# Patient Record
Sex: Female | Born: 1960 | Race: Black or African American | Hispanic: No | Smoking: Former smoker
Health system: Southern US, Community
[De-identification: ages and names within clinical notes are randomized; demographics above are authoritative.]

## PROBLEM LIST (undated history)

## (undated) DIAGNOSIS — L439 Lichen planus, unspecified: Secondary | ICD-10-CM

## (undated) DIAGNOSIS — T783XXA Angioneurotic edema, initial encounter: Secondary | ICD-10-CM

## (undated) DIAGNOSIS — L509 Urticaria, unspecified: Secondary | ICD-10-CM

## (undated) DIAGNOSIS — J45909 Unspecified asthma, uncomplicated: Secondary | ICD-10-CM

## (undated) DIAGNOSIS — T883XXA Malignant hyperthermia due to anesthesia, initial encounter: Secondary | ICD-10-CM

## (undated) HISTORY — DX: Lichen planus, unspecified: L43.9

## (undated) HISTORY — DX: Urticaria, unspecified: L50.9

## (undated) HISTORY — DX: Unspecified asthma, uncomplicated: J45.909

## (undated) HISTORY — DX: Angioneurotic edema, initial encounter: T78.3XXA

## (undated) HISTORY — PX: ADENOIDECTOMY: SUR15

---

## 1985-02-10 HISTORY — PX: TONSILLECTOMY: SUR1361

## 1986-02-10 HISTORY — PX: TUBAL LIGATION: SHX77

## 1997-11-17 ENCOUNTER — Encounter: Admission: RE | Admit: 1997-11-17 | Discharge: 1997-11-17 | Payer: Self-pay | Admitting: Family Medicine

## 1998-02-10 HISTORY — PX: UTERINE ARTERY EMBOLIZATION: SHX2629

## 1998-05-21 ENCOUNTER — Encounter: Admission: RE | Admit: 1998-05-21 | Discharge: 1998-05-21 | Payer: Self-pay | Admitting: Family Medicine

## 1998-05-29 ENCOUNTER — Encounter: Admission: RE | Admit: 1998-05-29 | Discharge: 1998-05-29 | Payer: Self-pay | Admitting: Family Medicine

## 1998-06-12 ENCOUNTER — Other Ambulatory Visit: Admission: RE | Admit: 1998-06-12 | Discharge: 1998-06-27 | Payer: Self-pay | Admitting: *Deleted

## 1998-07-12 ENCOUNTER — Encounter: Admission: RE | Admit: 1998-07-12 | Discharge: 1998-07-12 | Payer: Self-pay | Admitting: Family Medicine

## 1998-08-30 ENCOUNTER — Encounter: Admission: RE | Admit: 1998-08-30 | Discharge: 1998-08-30 | Payer: Self-pay | Admitting: Family Medicine

## 1998-09-18 ENCOUNTER — Encounter: Admission: RE | Admit: 1998-09-18 | Discharge: 1998-09-18 | Payer: Self-pay | Admitting: Family Medicine

## 1998-10-03 ENCOUNTER — Encounter: Admission: RE | Admit: 1998-10-03 | Discharge: 1998-10-03 | Payer: Self-pay | Admitting: Family Medicine

## 1998-10-08 ENCOUNTER — Encounter: Payer: Self-pay | Admitting: Emergency Medicine

## 1998-10-08 ENCOUNTER — Emergency Department (HOSPITAL_COMMUNITY): Admission: EM | Admit: 1998-10-08 | Discharge: 1998-10-08 | Payer: Self-pay | Admitting: Emergency Medicine

## 1999-04-26 ENCOUNTER — Encounter: Admission: RE | Admit: 1999-04-26 | Discharge: 1999-04-26 | Payer: Self-pay | Admitting: Family Medicine

## 1999-09-12 ENCOUNTER — Encounter: Admission: RE | Admit: 1999-09-12 | Discharge: 1999-09-12 | Payer: Self-pay | Admitting: *Deleted

## 1999-09-12 ENCOUNTER — Encounter: Payer: Self-pay | Admitting: Family Medicine

## 1999-09-13 ENCOUNTER — Encounter: Admission: RE | Admit: 1999-09-13 | Discharge: 1999-09-13 | Payer: Self-pay | Admitting: Family Medicine

## 1999-09-20 ENCOUNTER — Encounter: Admission: RE | Admit: 1999-09-20 | Discharge: 1999-09-20 | Payer: Self-pay | Admitting: Family Medicine

## 2000-05-29 ENCOUNTER — Encounter: Admission: RE | Admit: 2000-05-29 | Discharge: 2000-05-29 | Payer: Self-pay | Admitting: Family Medicine

## 2000-07-24 ENCOUNTER — Encounter: Admission: RE | Admit: 2000-07-24 | Discharge: 2000-07-24 | Payer: Self-pay | Admitting: Family Medicine

## 2000-07-27 ENCOUNTER — Encounter: Admission: RE | Admit: 2000-07-27 | Discharge: 2000-07-27 | Payer: Self-pay | Admitting: Family Medicine

## 2000-08-24 ENCOUNTER — Encounter: Admission: RE | Admit: 2000-08-24 | Discharge: 2000-08-24 | Payer: Self-pay | Admitting: Family Medicine

## 2000-10-13 ENCOUNTER — Encounter: Admission: RE | Admit: 2000-10-13 | Discharge: 2000-10-13 | Payer: Self-pay | Admitting: Family Medicine

## 2000-10-30 ENCOUNTER — Encounter: Admission: RE | Admit: 2000-10-30 | Discharge: 2000-10-30 | Payer: Self-pay | Admitting: Family Medicine

## 2000-11-18 ENCOUNTER — Encounter: Admission: RE | Admit: 2000-11-18 | Discharge: 2000-11-18 | Payer: Self-pay | Admitting: Family Medicine

## 2000-12-15 ENCOUNTER — Encounter: Admission: RE | Admit: 2000-12-15 | Discharge: 2000-12-15 | Payer: Self-pay | Admitting: Family Medicine

## 2001-03-10 ENCOUNTER — Encounter (INDEPENDENT_AMBULATORY_CARE_PROVIDER_SITE_OTHER): Payer: Self-pay | Admitting: *Deleted

## 2001-03-10 ENCOUNTER — Ambulatory Visit (HOSPITAL_COMMUNITY): Admission: RE | Admit: 2001-03-10 | Discharge: 2001-03-10 | Payer: Self-pay | Admitting: Gastroenterology

## 2001-07-05 ENCOUNTER — Encounter: Admission: RE | Admit: 2001-07-05 | Discharge: 2001-07-05 | Payer: Self-pay | Admitting: Family Medicine

## 2001-07-05 ENCOUNTER — Other Ambulatory Visit: Admission: RE | Admit: 2001-07-05 | Discharge: 2001-07-05 | Payer: Self-pay | Admitting: Family Medicine

## 2001-07-13 ENCOUNTER — Encounter: Payer: Self-pay | Admitting: Family Medicine

## 2001-07-13 ENCOUNTER — Encounter: Admission: RE | Admit: 2001-07-13 | Discharge: 2001-07-13 | Payer: Self-pay | Admitting: Family Medicine

## 2001-11-16 ENCOUNTER — Encounter: Admission: RE | Admit: 2001-11-16 | Discharge: 2001-11-16 | Payer: Self-pay | Admitting: Family Medicine

## 2001-11-18 ENCOUNTER — Encounter: Admission: RE | Admit: 2001-11-18 | Discharge: 2001-11-18 | Payer: Self-pay | Admitting: Family Medicine

## 2002-01-10 ENCOUNTER — Encounter: Admission: RE | Admit: 2002-01-10 | Discharge: 2002-01-10 | Payer: Self-pay | Admitting: Family Medicine

## 2002-02-23 ENCOUNTER — Ambulatory Visit (HOSPITAL_COMMUNITY): Admission: RE | Admit: 2002-02-23 | Discharge: 2002-02-23 | Payer: Self-pay | Admitting: Gastroenterology

## 2002-03-04 ENCOUNTER — Encounter (INDEPENDENT_AMBULATORY_CARE_PROVIDER_SITE_OTHER): Payer: Self-pay | Admitting: Specialist

## 2002-03-04 ENCOUNTER — Ambulatory Visit (HOSPITAL_COMMUNITY): Admission: RE | Admit: 2002-03-04 | Discharge: 2002-03-04 | Payer: Self-pay | Admitting: Surgery

## 2002-03-31 ENCOUNTER — Encounter: Admission: RE | Admit: 2002-03-31 | Discharge: 2002-03-31 | Payer: Self-pay | Admitting: Family Medicine

## 2002-03-31 ENCOUNTER — Encounter: Payer: Self-pay | Admitting: Sports Medicine

## 2002-03-31 ENCOUNTER — Ambulatory Visit (HOSPITAL_COMMUNITY): Admission: RE | Admit: 2002-03-31 | Discharge: 2002-03-31 | Payer: Self-pay | Admitting: Family Medicine

## 2002-04-04 ENCOUNTER — Encounter: Admission: RE | Admit: 2002-04-04 | Discharge: 2002-04-04 | Payer: Self-pay | Admitting: Family Medicine

## 2002-05-27 ENCOUNTER — Encounter: Admission: RE | Admit: 2002-05-27 | Discharge: 2002-05-27 | Payer: Self-pay | Admitting: Family Medicine

## 2002-06-07 ENCOUNTER — Encounter: Admission: RE | Admit: 2002-06-07 | Discharge: 2002-06-07 | Payer: Self-pay | Admitting: Family Medicine

## 2002-11-24 ENCOUNTER — Encounter: Admission: RE | Admit: 2002-11-24 | Discharge: 2002-11-24 | Payer: Self-pay | Admitting: Family Medicine

## 2003-01-10 ENCOUNTER — Encounter: Admission: RE | Admit: 2003-01-10 | Discharge: 2003-01-10 | Payer: Self-pay | Admitting: Family Medicine

## 2003-01-27 ENCOUNTER — Encounter: Admission: RE | Admit: 2003-01-27 | Discharge: 2003-01-27 | Payer: Self-pay | Admitting: Family Medicine

## 2003-03-15 ENCOUNTER — Encounter: Admission: RE | Admit: 2003-03-15 | Discharge: 2003-03-15 | Payer: Self-pay | Admitting: Sports Medicine

## 2003-03-27 ENCOUNTER — Encounter: Admission: RE | Admit: 2003-03-27 | Discharge: 2003-03-27 | Payer: Self-pay | Admitting: Sports Medicine

## 2003-05-12 ENCOUNTER — Encounter: Admission: RE | Admit: 2003-05-12 | Discharge: 2003-05-12 | Payer: Self-pay | Admitting: Family Medicine

## 2003-05-12 ENCOUNTER — Encounter: Payer: Self-pay | Admitting: Family Medicine

## 2003-05-12 ENCOUNTER — Encounter (INDEPENDENT_AMBULATORY_CARE_PROVIDER_SITE_OTHER): Payer: Self-pay | Admitting: *Deleted

## 2003-05-12 LAB — CONVERTED CEMR LAB

## 2003-08-26 ENCOUNTER — Inpatient Hospital Stay (HOSPITAL_COMMUNITY): Admission: AD | Admit: 2003-08-26 | Discharge: 2003-08-26 | Payer: Self-pay | Admitting: Obstetrics & Gynecology

## 2003-10-06 ENCOUNTER — Emergency Department (HOSPITAL_COMMUNITY): Admission: EM | Admit: 2003-10-06 | Discharge: 2003-10-07 | Payer: Self-pay | Admitting: Emergency Medicine

## 2003-11-07 ENCOUNTER — Emergency Department (HOSPITAL_COMMUNITY): Admission: EM | Admit: 2003-11-07 | Discharge: 2003-11-07 | Payer: Self-pay | Admitting: Emergency Medicine

## 2003-12-07 ENCOUNTER — Ambulatory Visit: Payer: Self-pay | Admitting: Family Medicine

## 2003-12-22 ENCOUNTER — Ambulatory Visit: Payer: Self-pay | Admitting: Family Medicine

## 2004-01-03 ENCOUNTER — Ambulatory Visit: Payer: Self-pay | Admitting: Family Medicine

## 2004-01-08 ENCOUNTER — Encounter (HOSPITAL_COMMUNITY): Admission: RE | Admit: 2004-01-08 | Discharge: 2004-01-11 | Payer: Self-pay | Admitting: Family Medicine

## 2004-01-10 ENCOUNTER — Ambulatory Visit: Payer: Self-pay | Admitting: Family Medicine

## 2004-01-11 ENCOUNTER — Ambulatory Visit: Payer: Self-pay | Admitting: Family Medicine

## 2004-01-15 ENCOUNTER — Ambulatory Visit (HOSPITAL_COMMUNITY): Admission: RE | Admit: 2004-01-15 | Discharge: 2004-01-15 | Payer: Self-pay | Admitting: Family Medicine

## 2004-01-22 ENCOUNTER — Ambulatory Visit: Payer: Self-pay | Admitting: Sports Medicine

## 2004-01-25 ENCOUNTER — Ambulatory Visit: Payer: Self-pay | Admitting: Family Medicine

## 2004-01-25 ENCOUNTER — Encounter (INDEPENDENT_AMBULATORY_CARE_PROVIDER_SITE_OTHER): Payer: Self-pay | Admitting: *Deleted

## 2004-01-25 ENCOUNTER — Other Ambulatory Visit: Admission: RE | Admit: 2004-01-25 | Discharge: 2004-01-25 | Payer: Self-pay | Admitting: Family Medicine

## 2004-02-20 ENCOUNTER — Encounter: Admission: RE | Admit: 2004-02-20 | Discharge: 2004-02-20 | Payer: Self-pay | Admitting: Family Medicine

## 2004-02-25 ENCOUNTER — Encounter: Admission: RE | Admit: 2004-02-25 | Discharge: 2004-02-25 | Payer: Self-pay | Admitting: Interventional Radiology

## 2004-03-11 ENCOUNTER — Ambulatory Visit (HOSPITAL_COMMUNITY): Admission: RE | Admit: 2004-03-11 | Discharge: 2004-03-11 | Payer: Self-pay | Admitting: Interventional Radiology

## 2004-03-14 ENCOUNTER — Observation Stay (HOSPITAL_COMMUNITY): Admission: RE | Admit: 2004-03-14 | Discharge: 2004-03-16 | Payer: Self-pay | Admitting: Interventional Radiology

## 2004-03-18 ENCOUNTER — Encounter: Payer: Self-pay | Admitting: Emergency Medicine

## 2004-03-19 ENCOUNTER — Observation Stay (HOSPITAL_COMMUNITY): Admission: AD | Admit: 2004-03-19 | Discharge: 2004-03-20 | Payer: Self-pay | Admitting: Family Medicine

## 2004-03-28 ENCOUNTER — Ambulatory Visit: Payer: Self-pay | Admitting: Family Medicine

## 2004-04-01 ENCOUNTER — Ambulatory Visit (HOSPITAL_COMMUNITY): Admission: RE | Admit: 2004-04-01 | Discharge: 2004-04-01 | Payer: Self-pay | Admitting: Family Medicine

## 2004-04-01 ENCOUNTER — Ambulatory Visit: Payer: Self-pay | Admitting: Family Medicine

## 2004-04-01 ENCOUNTER — Encounter (INDEPENDENT_AMBULATORY_CARE_PROVIDER_SITE_OTHER): Payer: Self-pay | Admitting: Specialist

## 2004-05-07 ENCOUNTER — Encounter: Admission: RE | Admit: 2004-05-07 | Discharge: 2004-05-07 | Payer: Self-pay | Admitting: Family Medicine

## 2004-05-16 ENCOUNTER — Ambulatory Visit: Payer: Self-pay | Admitting: Family Medicine

## 2004-06-13 ENCOUNTER — Ambulatory Visit: Payer: Self-pay | Admitting: Family Medicine

## 2004-06-17 ENCOUNTER — Ambulatory Visit: Payer: Self-pay | Admitting: Family Medicine

## 2004-07-03 ENCOUNTER — Ambulatory Visit: Payer: Self-pay | Admitting: Family Medicine

## 2004-07-04 ENCOUNTER — Ambulatory Visit: Payer: Self-pay | Admitting: Obstetrics and Gynecology

## 2004-08-19 ENCOUNTER — Emergency Department (HOSPITAL_COMMUNITY): Admission: EM | Admit: 2004-08-19 | Discharge: 2004-08-19 | Payer: Self-pay | Admitting: Family Medicine

## 2004-08-28 ENCOUNTER — Ambulatory Visit: Payer: Self-pay | Admitting: Family Medicine

## 2004-09-26 ENCOUNTER — Ambulatory Visit: Payer: Self-pay | Admitting: Family Medicine

## 2004-10-03 ENCOUNTER — Encounter: Admission: RE | Admit: 2004-10-03 | Discharge: 2004-10-03 | Payer: Self-pay | Admitting: Interventional Radiology

## 2005-01-09 ENCOUNTER — Ambulatory Visit: Payer: Self-pay | Admitting: Sports Medicine

## 2005-01-27 ENCOUNTER — Ambulatory Visit: Payer: Self-pay | Admitting: Family Medicine

## 2005-02-17 ENCOUNTER — Ambulatory Visit: Payer: Self-pay | Admitting: Sports Medicine

## 2005-04-02 ENCOUNTER — Ambulatory Visit: Payer: Self-pay | Admitting: Sports Medicine

## 2005-04-04 ENCOUNTER — Ambulatory Visit: Payer: Self-pay | Admitting: Family Medicine

## 2005-05-14 ENCOUNTER — Ambulatory Visit: Payer: Self-pay | Admitting: Family Medicine

## 2005-06-23 ENCOUNTER — Ambulatory Visit: Payer: Self-pay | Admitting: Sports Medicine

## 2006-04-09 DIAGNOSIS — M159 Polyosteoarthritis, unspecified: Secondary | ICD-10-CM | POA: Insufficient documentation

## 2006-04-09 DIAGNOSIS — G5600 Carpal tunnel syndrome, unspecified upper limb: Secondary | ICD-10-CM | POA: Insufficient documentation

## 2006-04-09 DIAGNOSIS — J309 Allergic rhinitis, unspecified: Secondary | ICD-10-CM | POA: Insufficient documentation

## 2006-04-09 DIAGNOSIS — J45909 Unspecified asthma, uncomplicated: Secondary | ICD-10-CM | POA: Insufficient documentation

## 2006-04-09 DIAGNOSIS — R946 Abnormal results of thyroid function studies: Secondary | ICD-10-CM | POA: Insufficient documentation

## 2006-04-10 ENCOUNTER — Encounter (INDEPENDENT_AMBULATORY_CARE_PROVIDER_SITE_OTHER): Payer: Self-pay | Admitting: *Deleted

## 2006-05-04 ENCOUNTER — Encounter: Admission: RE | Admit: 2006-05-04 | Discharge: 2006-05-04 | Payer: Self-pay | Admitting: Family Medicine

## 2006-05-04 ENCOUNTER — Encounter: Payer: Self-pay | Admitting: Family Medicine

## 2006-05-07 ENCOUNTER — Telehealth (INDEPENDENT_AMBULATORY_CARE_PROVIDER_SITE_OTHER): Payer: Self-pay | Admitting: Family Medicine

## 2006-05-08 ENCOUNTER — Telehealth: Payer: Self-pay | Admitting: Family Medicine

## 2006-05-08 ENCOUNTER — Telehealth (INDEPENDENT_AMBULATORY_CARE_PROVIDER_SITE_OTHER): Payer: Self-pay | Admitting: *Deleted

## 2006-05-12 ENCOUNTER — Encounter: Payer: Self-pay | Admitting: Family Medicine

## 2006-07-23 ENCOUNTER — Encounter (INDEPENDENT_AMBULATORY_CARE_PROVIDER_SITE_OTHER): Payer: Self-pay | Admitting: Gynecology

## 2006-07-23 ENCOUNTER — Ambulatory Visit: Payer: Self-pay | Admitting: Gynecology

## 2006-11-10 ENCOUNTER — Encounter: Payer: Self-pay | Admitting: Family Medicine

## 2006-11-11 ENCOUNTER — Encounter: Payer: Self-pay | Admitting: Family Medicine

## 2007-01-01 ENCOUNTER — Encounter: Payer: Self-pay | Admitting: Family Medicine

## 2007-01-09 ENCOUNTER — Emergency Department (HOSPITAL_COMMUNITY): Admission: EM | Admit: 2007-01-09 | Discharge: 2007-01-09 | Payer: Self-pay | Admitting: Family Medicine

## 2007-01-09 ENCOUNTER — Telehealth (INDEPENDENT_AMBULATORY_CARE_PROVIDER_SITE_OTHER): Payer: Self-pay | Admitting: Family Medicine

## 2007-01-11 ENCOUNTER — Encounter: Payer: Self-pay | Admitting: Family Medicine

## 2007-01-21 ENCOUNTER — Encounter: Payer: Self-pay | Admitting: Family Medicine

## 2007-01-21 ENCOUNTER — Telehealth: Payer: Self-pay | Admitting: *Deleted

## 2007-01-22 ENCOUNTER — Encounter: Payer: Self-pay | Admitting: Family Medicine

## 2007-01-27 ENCOUNTER — Ambulatory Visit: Payer: Self-pay | Admitting: Family Medicine

## 2007-01-29 ENCOUNTER — Encounter: Payer: Self-pay | Admitting: Family Medicine

## 2007-01-29 DIAGNOSIS — D126 Benign neoplasm of colon, unspecified: Secondary | ICD-10-CM | POA: Insufficient documentation

## 2007-01-29 DIAGNOSIS — K219 Gastro-esophageal reflux disease without esophagitis: Secondary | ICD-10-CM | POA: Insufficient documentation

## 2007-02-16 ENCOUNTER — Ambulatory Visit: Payer: Self-pay | Admitting: Family Medicine

## 2007-03-11 ENCOUNTER — Ambulatory Visit: Payer: Self-pay | Admitting: Internal Medicine

## 2007-03-11 ENCOUNTER — Telehealth (INDEPENDENT_AMBULATORY_CARE_PROVIDER_SITE_OTHER): Payer: Self-pay | Admitting: *Deleted

## 2007-03-30 ENCOUNTER — Encounter: Payer: Self-pay | Admitting: Family Medicine

## 2007-04-23 ENCOUNTER — Encounter: Payer: Self-pay | Admitting: Family Medicine

## 2007-04-29 ENCOUNTER — Encounter (INDEPENDENT_AMBULATORY_CARE_PROVIDER_SITE_OTHER): Payer: Self-pay | Admitting: Family Medicine

## 2007-04-29 ENCOUNTER — Ambulatory Visit: Payer: Self-pay | Admitting: Family Medicine

## 2007-07-20 ENCOUNTER — Telehealth: Payer: Self-pay | Admitting: *Deleted

## 2007-09-06 ENCOUNTER — Encounter: Admission: RE | Admit: 2007-09-06 | Discharge: 2007-09-06 | Payer: Self-pay | Admitting: Family Medicine

## 2007-10-11 ENCOUNTER — Telehealth: Payer: Self-pay | Admitting: *Deleted

## 2007-10-25 ENCOUNTER — Ambulatory Visit: Payer: Self-pay | Admitting: Family Medicine

## 2007-10-25 ENCOUNTER — Encounter: Payer: Self-pay | Admitting: Family Medicine

## 2007-11-09 ENCOUNTER — Encounter: Payer: Self-pay | Admitting: Family Medicine

## 2007-11-09 ENCOUNTER — Ambulatory Visit: Payer: Self-pay | Admitting: Family Medicine

## 2007-11-09 ENCOUNTER — Telehealth: Payer: Self-pay | Admitting: Family Medicine

## 2007-11-09 LAB — CONVERTED CEMR LAB
AST: 16 units/L (ref 0–37)
Alkaline Phosphatase: 72 units/L (ref 39–117)
BUN: 11 mg/dL (ref 6–23)
Creatinine, Ser: 0.74 mg/dL (ref 0.40–1.20)
Glucose, Bld: 96 mg/dL (ref 70–99)
HCT: 39.4 % (ref 36.0–46.0)
HDL: 41 mg/dL (ref >39)
Hemoglobin: 13.2 g/dL (ref 12.0–15.0)
LDL Cholesterol: 85 mg/dL (ref 0–99)
MCHC: 33.5 g/dL (ref 30.0–36.0)
MCV: 88.5 fL (ref 78.0–100.0)
Potassium: 4.1 meq/L (ref 3.5–5.3)
RDW: 12.7 % (ref 11.5–15.5)
TSH: 0.551 microintl units/mL (ref 0.350–4.50)
Total Bilirubin: 0.4 mg/dL (ref 0.3–1.2)
Total CHOL/HDL Ratio: 3.6
Triglycerides: 101 mg/dL (ref <150)
VLDL: 20 mg/dL (ref 0–40)

## 2008-05-30 ENCOUNTER — Ambulatory Visit: Payer: Self-pay | Admitting: Family Medicine

## 2008-05-30 ENCOUNTER — Telehealth: Payer: Self-pay | Admitting: Family Medicine

## 2008-05-30 DIAGNOSIS — I10 Essential (primary) hypertension: Secondary | ICD-10-CM | POA: Insufficient documentation

## 2008-05-31 ENCOUNTER — Telehealth: Payer: Self-pay | Admitting: Family Medicine

## 2008-06-28 ENCOUNTER — Ambulatory Visit: Payer: Self-pay | Admitting: Family Medicine

## 2008-10-27 ENCOUNTER — Ambulatory Visit: Payer: Self-pay | Admitting: Family Medicine

## 2008-10-27 ENCOUNTER — Encounter: Admission: RE | Admit: 2008-10-27 | Discharge: 2008-10-27 | Payer: Self-pay | Admitting: Family Medicine

## 2008-10-27 DIAGNOSIS — M545 Low back pain, unspecified: Secondary | ICD-10-CM | POA: Insufficient documentation

## 2008-12-20 ENCOUNTER — Telehealth: Payer: Self-pay | Admitting: Family Medicine

## 2008-12-20 ENCOUNTER — Encounter: Payer: Self-pay | Admitting: Family Medicine

## 2009-03-21 ENCOUNTER — Telehealth: Payer: Self-pay | Admitting: Family Medicine

## 2009-03-22 ENCOUNTER — Ambulatory Visit: Payer: Self-pay | Admitting: Family Medicine

## 2009-03-26 ENCOUNTER — Ambulatory Visit (HOSPITAL_COMMUNITY): Admission: RE | Admit: 2009-03-26 | Discharge: 2009-03-26 | Payer: Self-pay | Admitting: Family Medicine

## 2009-03-27 ENCOUNTER — Telehealth: Payer: Self-pay | Admitting: Family Medicine

## 2009-03-28 ENCOUNTER — Encounter: Payer: Self-pay | Admitting: Family Medicine

## 2009-11-01 ENCOUNTER — Encounter: Payer: Self-pay | Admitting: Family Medicine

## 2010-01-25 ENCOUNTER — Ambulatory Visit: Payer: Self-pay | Admitting: Family Medicine

## 2010-02-15 ENCOUNTER — Ambulatory Visit: Admission: RE | Admit: 2010-02-15 | Discharge: 2010-02-15 | Payer: Self-pay | Source: Home / Self Care

## 2010-02-15 ENCOUNTER — Encounter: Payer: Self-pay | Admitting: Family Medicine

## 2010-02-18 LAB — CONVERTED CEMR LAB
ALT: 12 units/L (ref 0–35)
AST: 14 units/L (ref 0–37)
BUN: 9 mg/dL (ref 6–23)
Creatinine, Ser: 0.73 mg/dL (ref 0.40–1.20)
HCT: 37 % (ref 36.0–46.0)
HDL: 38 mg/dL — ABNORMAL LOW (ref >39)
Hemoglobin: 12.6 g/dL (ref 12.0–15.0)
LDL Cholesterol: 85 mg/dL (ref 0–99)
MCV: 88.1 fL (ref 78.0–100.0)
Platelets: 267 10*3/uL (ref 150–400)
RDW: 12.9 % (ref 11.5–15.5)
Total Bilirubin: 0.4 mg/dL (ref 0.3–1.2)
Total CHOL/HDL Ratio: 3.6

## 2010-02-28 ENCOUNTER — Ambulatory Visit: Admit: 2010-02-28 | Payer: Self-pay | Admitting: Obstetrics & Gynecology

## 2010-03-03 ENCOUNTER — Encounter: Payer: Self-pay | Admitting: Family Medicine

## 2010-03-03 ENCOUNTER — Encounter: Payer: Self-pay | Admitting: Interventional Radiology

## 2010-03-12 NOTE — Letter (Signed)
Summary: Generic Letter  [facility]  [address]   [place], [place] [address]   Phone: [phone]  Fax: [phone]    03/28/2009  NESSA RAMAKER [address] [place], [place]  [address]  Dear Ms. Rutledge,   I am happy to tell you that your x-ray of your knee was normal.  Please let us know if you continue to have problems with pain.   Sincerely,   Tyden Kann [place] MD  Appended Document: Generic Letter mailed.

## 2010-03-12 NOTE — Progress Notes (Signed)
Summary: triage  Phone Note Call from Patient Call back at [phone] ext [phone]   Caller: Patient Summary of Call: Banged knee on corner of desk a couple of weeks ago and has swelling down her calf.  Painful to touch on the inside of leg.  Has tried to keep her leg elevated.  Not sure if she should be seen for this or not. Initial call taken by: Clydell Hakim,  March 21, 2009 1:52 PM  Follow-up for Phone Call        advised seeing a doctor. appt tomorrow with Dr. Sw[place]iland at 11:45 .unable to come at any other time & can use the time for her lunch break. continue otc Follow-up by: Golden Circle RN,  March 21, 2009 2:02 PM  Additional Follow-up for Phone Call Additional follow up Details #1::        noted and agree Additional Follow-up by: Doralee Albino MD,  March 21, 2009 2:17 PM

## 2010-03-12 NOTE — Progress Notes (Signed)
Summary: results  Phone Note Call from Patient Call back at Home Phone [phone] Call back at [phone]   Caller: Patient Summary of Call: pt is wanting results of xrays from yesterday Initial call taken by: De Nurse,  March 27, 2009 12:24 PM  Follow-up for Phone Call        will forward to MD. Follow-up by: Theresia Lo RN,  March 27, 2009 1:50 PM  Additional Follow-up for Phone Call Additional follow up Details #1::        called and informed Xrays are normal Additional Follow-up by: Doralee Albino MD,  March 27, 2009 2:52 PM

## 2010-03-12 NOTE — Miscellaneous (Signed)
Summary: Asthma clarifcation  Clinical Lists Changes  Problems: Changed problem from ASTHMA, UNSPECIFIED (ICD-493.90) to ASTHMA, PERSISTENT, MODERATE (ICD-493.90) 

## 2010-03-12 NOTE — Assessment & Plan Note (Signed)
Summary: knee & leg injury f/u/Bamberg/Hensel   Vital Signs:  Patient profile:   50 year old female Weight:      184.8 pounds Temp:     98 degrees F oral Pulse rate:   79 / minute Pulse rhythm:   regular BP sitting:   139 / 103  Vitals Entered By: Loralee Pacas CMA (March 22, 2009 12:11 PM) Pain Assessment Patient in pain? yes     Location: knee Intensity: 8   History of Present Illness: Pt banged knee on corner of desk 3 weeks ago, and has tingling down thigh.   Knee feels really tender on inside.  Feels swollen around knee and calf.  Then banged knee again this week - working in another area and is adjusting to this.  Pain hurts worse in morning, but does not ease during day.  Pain 8-10/10.  Took Motrin or Aleve and went to sleep.  No locking.  Worse after sitting long time.   Denies previous knee pain.  Continued to work out with cardio/zumba class until 2 weeks ago.    Did not take BP meds today. No chest pain/SOB.  Slight HA.  Allergies: 1)  ! Codeine 2)  ! Morphine 3)  ! Percocet 4)  ! Flagyl 5)  ! Sulfa 6)  ! * Anesthesia (No Gases)  Review of Systems       See HPI  Physical Exam  General:  Well-developed,well-nourished,in no acute distress; alert,appropriate and cooperative throughout examination Lungs:  Normal respiratory effort, chest expands symmetrically. Lungs are clear to auscultation, no crackles or wheezes. Heart:  Normal rate and regular rhythm. S1 and S2 normal without gallop, murmur, click, rub or other extra sounds. Msk:  B knees without erythema/warmth/swelling. B knees stable ant/post/media/lat.  No crepitus.   L knee very TTP along medial  joint line.FROM flexion, extension.  Neg Mcmurray's B.   Extremities:  Calf without swelling or warmth B.  L calf somewhat TTP.   Impression & Recommendations:  Problem # 1:  KNEE PAIN, LEFT (ICD-719.46) Given several weeks of pain and tenderness along joint line, will check knee films.  Pt taking minimal  amounts of NSAIDs.  May increase these.  If no relief, return to clinic. Her updated medication list for this problem includes:    Diclofenac Sodium Cr 100 Mg Xr24h-tab (Diclofenac sodium) ..... One by mouth daily for back and neck pain    Cyclobenzaprine Hcl 10 Mg Tabs (Cyclobenzaprine hcl) ..... One by mouth at bedtime as needed for muscle spasm    Naproxen 500 Mg Tabs (Naproxen) .Marland Kitchen... Take 1 pill twice daily for knee pain.  Orders: Diagnostic X-Ray/Fluoroscopy (Diagnostic X-Ray/Flu) [facility]- Est Level  3 ([address])  Problem # 2:  HYPERTENSION, BENIGN ESSENTIAL (ICD-401.1)  Elevated today.  Did not take meds today.  No alarming symptoms.  Follow up with Dr. Leveda Anna within 4 weeks. updated medication list for this problem includes:    Hydrochlorothiazide 12.5 Mg Tabs (Hydrochlorothiazide) .Marland Kitchen... Take 1 tab  by mouth every morning  Orders: [facility]- Est Level  3 ([address])  Complete Medication List: 1)  Veramyst 27.5 Mcg/spray Susp (Fluticasone furoate) .... 2 sprays each nostril daily 2)  Albuterol 90 Mcg/act Aers (Albuterol) .... 2 puffs q4h prn 3)  Advair Diskus 250-50 Mcg/dose Misc (Fluticasone-salmeterol) .... One by mouth bid 4)  Loratadine 10 Mg Tabs (Loratadine) .... One by mouth daily otc 5)  Hydrochlorothiazide 12.5 Mg Tabs (Hydrochlorothiazide) .... Take 1 tab  by mouth every morning 6)  Azelastine Hcl 0.05 % Soln (Azelastine hcl) .... One drop each eye two times a day disp one bottle 7)  Diclofenac Sodium Cr 100 Mg Xr24h-tab (Diclofenac sodium) .... One by mouth daily for back and neck pain 8)  Cyclobenzaprine Hcl 10 Mg Tabs (Cyclobenzaprine hcl) .... One by mouth at bedtime as needed for muscle spasm 9)  Naproxen 500 Mg Tabs (Naproxen) .... Take 1 pill twice daily for knee pain.  Other Orders: Influenza Vaccine NON MCR ([address])  Patient Instructions: 1)  Please stop by the radiology department in the hospital to get the knee x-ray. 2)  Try the naproxyn twice a day.  I have sent the  prescription to [facility]. 3)  Please come back to see Dr. Leveda Anna within 4 weeks to talk about your blood pressure.   4)  Let us know if the knee is getting worse or if you have any concerns. Prescriptions: NAPROXEN 500 MG TABS (NAPROXEN) Take 1 pill twice daily for knee pain.  #60 x 0   Entered and Authorized by:   Dezman Granda [place] MD   Signed by:   Letticia Bhattacharyya [place] MD on 03/22/2009   Method used:   Electronically to        [facility] (retail)       [address]       [place], [place]  [address]       Ph: [phone]       Fax: [phone]   RxID:   [id number]    Orders Added: 1)  Diagnostic X-Ray/Fluoroscopy [Diagnostic X-Ray/Flu] 2)  Influenza Vaccine NON MCR [00028] 3)  [facility]- Est Level  3 [[address]]    Immunizations Administered:  Influenza Vaccine # 1:    Vaccine Type: Fluvax Non-MCR    Site: right deltoid    Mfr: GlaxoSmithKline    Dose: 0.5 ml    Route: IM    Given by: Tessie Fass CMA    Exp. Date: 08/09/2009    Lot #: [id number]    VIS given: 09/03/06 version given March 22, 2009.  Flu Vaccine Consent Questions:    Do you have a history of severe allergic reactions to this vaccine? no    Any prior history of allergic reactions to egg and/or gelatin? no    Do you have a sensitivity to the preservative Thimersol? no    Do you have a past history of Guillan-Barre Syndrome? no    Do you currently have an acute febrile illness? no    Have you ever had a severe reaction to latex? no    Vaccine information given and explained to patient? yes    Are you currently pregnant? no

## 2010-03-13 ENCOUNTER — Encounter: Payer: Self-pay | Admitting: Family Medicine

## 2010-03-14 NOTE — Assessment & Plan Note (Signed)
Summary: vaginal bleeding, bump in vaginal area/tlb   Vital Signs:  Patient profile:   50 year old female Height:      66.5 inches Weight:      189 pounds BMI:     30.16 BSA:     1.97 Temp:     98.1 degrees F Pulse rate:   72 / minute BP sitting:   142 / 92  Vitals Entered By: Jone Baseman CMA (January 25, 2010 10:54 AM) CC: vaginal bump Is Patient Diabetic? No Pain Assessment Patient in pain? no        Primary Care Provider:  Doralee Albino MD  CC:  vaginal bump.  History of Present Illness: 1) Bump on left labum minorum: x past few days. Not painful, but noted some slight bleeding from it. No prior history of labial lesions.   Denies dysuria, hematuria, menstruation or vaginal bleeding, vaginitis, vaginal discharge, fever,   Habits & Providers  Alcohol-Tobacco-Diet     Tobacco Status: never     Tobacco Counseling: not to resume use of tobacco products  Current Medications (verified): 1)  Veramyst 27.5 Mcg/spray  Susp (Fluticasone Furoate) .... 2 Sprays Each Nostril Daily 2)  Albuterol 90 Mcg/act  Aers (Albuterol) .... 2 Puffs Q4h Prn 3)  Advair Diskus 250-50 Mcg/dose Misc (Fluticasone-Salmeterol) .... One By Mouth Bid 4)  Loratadine 10 Mg Tabs (Loratadine) .... One By Mouth Daily Otc 5)  Hydrochlorothiazide 12.5 Mg  Tabs (Hydrochlorothiazide) .... Take 1 Tab  By Mouth Every Morning 6)  Azelastine Hcl 0.05 % Soln (Azelastine Hcl) .... One Drop Each Eye Two Times A Day Disp One Bottle 7)  Diclofenac Sodium Cr 100 Mg Xr24h-Tab (Diclofenac Sodium) .... One By Mouth Daily For Back and Neck Pain 8)  Cyclobenzaprine Hcl 10 Mg Tabs (Cyclobenzaprine Hcl) .... One By Mouth At Bedtime As Needed For Muscle Spasm 9)  Naproxen 500 Mg Tabs (Naproxen) .... Take 1 Pill Twice Daily For Knee Pain.  Allergies (verified): 1)  ! Codeine 2)  ! Morphine 3)  ! Percocet 4)  ! Flagyl 5)  ! Sulfa 6)  ! * Anesthesia (No Gases)  Physical Exam  General:  obese, pleasant, NAD    Mouth:  no oral lesions  Genitalia:  3mm area of induration left labium minorum w/o fluctuance or erythema  no vaginal discharge mucosa pink and moist    Impression & Recommendations:  Problem # 1:  BARTHOLIN'S CYST ABSCESS (ICD-616.3) Assessment New  Possibly mild Bartholin's cyst abscess given location. Appears to be healing well. Follow up as needed. Would have her mention this to her PCP as well given reports of bleeding. Advised to look out for any bleeding in the future.   Orders: [facility]- Est Level  3 ([address])  Complete Medication List: 1)  Veramyst 27.5 Mcg/spray Susp (Fluticasone furoate) .... 2 sprays each nostril daily 2)  Albuterol 90 Mcg/act Aers (Albuterol) .... 2 puffs q4h prn 3)  Advair Diskus 250-50 Mcg/dose Misc (Fluticasone-salmeterol) .... One by mouth bid 4)  Loratadine 10 Mg Tabs (Loratadine) .... One by mouth daily otc 5)  Hydrochlorothiazide 12.5 Mg Tabs (Hydrochlorothiazide) .... Take 1 tab  by mouth every morning 6)  Azelastine Hcl 0.05 % Soln (Azelastine hcl) .... One drop each eye two times a day disp one bottle 7)  Diclofenac Sodium Cr 100 Mg Xr24h-tab (Diclofenac sodium) .... One by mouth daily for back and neck pain 8)  Cyclobenzaprine Hcl 10 Mg Tabs (Cyclobenzaprine hcl) .... One by mouth at  bedtime as needed for muscle spasm 9)  Naproxen 500 Mg Tabs (Naproxen) .... Take 1 pill twice daily for knee pain. 10)  Epipen 2-pak 0.3 Mg/0.18ml Devi (Epinephrine) .... Use as directed im for anaphylaxis Prescriptions: EPIPEN 2-PAK 0.3 MG/0.3ML DEVI (EPINEPHRINE) use as directed IM for anaphylaxis  #1 x 0   Entered and Authorized by:   Bobby Rumpf  MD   Signed by:   Bobby Rumpf  MD on 01/25/2010   Method used:   Electronically to        [facility] (retail)       [address]       [place], [place]  [address]       Ph: [phone]       Fax: [phone]   RxID:   [id number] ALBUTEROL 90 MCG/ACT  AERS (ALBUTEROL) 2 puffs  q4h prn  #1 x 1   Entered and Authorized by:   Bobby Rumpf  MD   Signed by:   Bobby Rumpf  MD on 01/25/2010   Method used:   Electronically to        [facility] (retail)       [address]       [place], [place]  [address]       Ph: [phone]       Fax: [phone]   RxID:   [id number]    Orders Added: 1)  [facility]- Est Level  3 [[address]]

## 2010-03-14 NOTE — Assessment & Plan Note (Signed)
Summary: CPE/KH   FLU SHOT GIVEN TODAY.Jimmy Footman, CMA  February 15, 2010 5:08 PM   Vital Signs:  Patient profile:   50 year old female Weight:      189 pounds BMI:     30.16 Temp:     98.5 degrees F oral Pulse rate:   83 / minute Pulse rhythm:   regular BP sitting:   145 / 100  (left arm) Cuff size:   regular  Vitals Entered By: Loralee Pacas CMA (February 15, 2010 3:30 PM)  Serial Vital Signs/Assessments:  Time      Position  BP       Pulse  Resp  Temp     By 3:42 PM             158/97                         Jimmy Footman, CMA   Primary Care Provider:  Doralee Albino MD   History of Present Illness: Here for annual physical.  States she is doing well and has no active complaints.    In addition to reviewing her screening tests and immunizations, we discussed Hx of anemia secondary to menorrhagia.  Now no menses.  Will recheck one more time. Asthma seems to be well controled. Wt is up.  not exercising as much and has been off her HCTZ.  Will restart  Habits & Providers  Alcohol-Tobacco-Diet     Alcohol drinks/day: 0     Tobacco Status: quit     Year Quit: 1995     Diet Comments: healthy  Exercise-Depression-Behavior     Does Patient Exercise: yes     Exercise Counseling: not indicated; exercise is adequate     Exercise (avg: min/session): 30-60     Times/week: 7     Have you felt down or hopeless? no     Have you felt little pleasure in things? no     STD Risk: never     Drug Use: never     Seat Belt Use: always  Current Medications (verified): 1)  Fluticasone Propionate 50 Mcg/act Susp (Fluticasone Propionate) .... One Puff Each Nostril Daily 2)  Ventolin Hfa 108 (90 Base) Mcg/act Aers (Albuterol Sulfate) .... One or Two Puffs Q4h As Needed Wheezing (Rescue) 3)  Qvar 40 Mcg/act Aers (Beclomethasone Dipropionate) .... One Puff Daily (Control) 4)  Hydrochlorothiazide 12.5 Mg  Tabs (Hydrochlorothiazide) .... Take 1 Tab  By Mouth Every Morning 5)  Diclofenac  Sodium Cr 100 Mg Xr24h-Tab (Diclofenac Sodium) .... One By Mouth Daily For Back and Neck Pain 6)  Epipen 2-Pak 0.3 Mg/0.46ml Devi (Epinephrine) .... Use As Directed Im For Anaphylaxis  Allergies (verified): 1)  ! Codeine 2)  ! Morphine 3)  ! Percocet 4)  ! Flagyl 5)  ! Sulfa 6)  ! * Anesthesia (No Gases)  Past History:  Past Medical History: Last updated: 04/29/2007 asthma is stage 1, Euthyroid Goiter, h/o Chlamydia   Past Surgical History: Last updated: 11/09/2007 ct - Abd/pelv 03/2002: degenerating fibroid - 03/31/2002,  hemrhoidectomy - 02/10/2002, T & A -, Tubal ligation -  Had uterine artery embolization 2006  Family History: Last updated: 04/29/2007 Breast Ca in [place], HTN/DM in [place], No  sig CAD/CVA/other Ca  Social History: Last updated: 04/29/2007 divorced, 2 chilren (females); [place] born 2; [place] born 55; Works as Environmental health practitioner  Risk Factors: Alcohol Use: 0 (02/15/2010) Diet: healthy (02/15/2010) Exercise: yes (  02/15/2010)  Risk Factors: Smoking Status: quit (02/15/2010)  Social History: Does Patient Exercise:  yes Smoking Status:  quit Drug Use:  never Seat Belt Use:  always STD Risk:  never  Review of Systems  The patient denies anorexia, weight loss, hoarseness, chest pain, dyspnea on exertion, peripheral edema, abdominal pain, melena, severe indigestion/heartburn, depression, and abnormal bleeding.    Physical Exam  General:  Well-developed,well-nourished,in no acute distress; alert,appropriate and cooperative throughout examination Ears:  External ear exam shows no significant lesions or deformities.  Otoscopic examination reveals clear canals, tympanic membranes are intact bilaterally without bulging, retraction, inflammation or discharge. Hearing is grossly normal bilaterally. Mouth:  Oral mucosa and oropharynx without lesions or exudates.  Teeth in good repair. Neck:  No deformities, masses, or tenderness noted. Breasts:   No mass, nodules, thickening, tenderness, bulging, retraction, inflamation, nipple discharge or skin changes noted.   Lungs:  Normal respiratory effort, chest expands symmetrically. Lungs are clear to auscultation, no crackles or wheezes. Heart:  Normal rate and regular rhythm. S1 and S2 normal without gallop, murmur, click, rub or other extra sounds. Abdomen:  Bowel sounds positive,abdomen soft and non-tender without masses, organomegaly or hernias noted. Genitalia:  Normal introitus for age, no external lesions, no vaginal discharge, mucosa pink and moist, no vaginal or cervical lesions, no vaginal atrophy, no friaility or hemorrhage, normal uterus size and position, no adnexal masses or tenderness Extremities:  No clubbing, cyanosis, edema, or deformity noted with normal full range of motion of all joints.   Neurologic:  No cranial nerve deficits noted. Station and gait are normal. Plantar reflexes are down-going bilaterally. DTRs are symmetrical throughout. Sensory, motor and coordinative functions appear intact.   Impression & Recommendations:  Problem # 1:  Preventive Health Care (ICD-V70.0)  Doing well Needs to increase activity and lose wt  Orders: [facility] - Est  40-64 yrs ([address])  Problem # 2:  HYPERTENSION, BENIGN ESSENTIAL (ICD-401.1) Assessment: Deteriorated Restart HCTZ Her updated medication list for this problem includes:    Hydrochlorothiazide 12.5 Mg Tabs (Hydrochlorothiazide) .Marland Kitchen... Take 1 tab  by mouth every morning  Orders: Comp Met-[facility] ([address]) CBC-[facility] ([address]) Lipid-[facility] ([address])  Problem # 3:  ASTHMA, PERSISTENT, MODERATE (ICD-493.90) Well controled.  For cost savings and because of good control, change Advair to Qvar. Her updated medication list for this problem includes:    Ventolin Hfa 108 (90 Base) Mcg/act Aers (Albuterol sulfate) ..... One or two puffs q4h as needed wheezing (rescue)    Qvar 40 Mcg/act Aers (Beclomethasone dipropionate) ..... One puff  daily (control)  Problem # 4:  ANEMIA, OTHER, UNSPECIFIED (ICD-285.9) Doubt this remains a problem, but will check one more time. Orders: CBC-[facility] ([address])  Complete Medication List: 1)  Fluticasone Propionate 50 Mcg/act Susp (Fluticasone propionate) .... One puff each nostril daily 2)  Ventolin Hfa 108 (90 Base) Mcg/act Aers (Albuterol sulfate) .... One or two puffs q4h as needed wheezing (rescue) 3)  Qvar 40 Mcg/act Aers (Beclomethasone dipropionate) .... One puff daily (control) 4)  Hydrochlorothiazide 12.5 Mg Tabs (Hydrochlorothiazide) .... Take 1 tab  by mouth every morning 5)  Diclofenac Sodium Cr 100 Mg Xr24h-tab (Diclofenac sodium) .... One by mouth daily for back and neck pain 6)  Epipen 2-pak 0.3 Mg/0.26ml Devi (Epinephrine) .... Use as directed im for anaphylaxis  Other Orders: Mammogram (Screening) (Mammo) Pap Smear-[facility] ([address]-28413) Flu Vaccine 17yrs + ([address]) Admin 1st Vaccine ([address]) Prescriptions: EPIPEN 2-PAK 0.3 MG/0.3ML DEVI (EPINEPHRINE) use as directed IM for anaphylaxis  #1 x  3   Entered and Authorized by:   Doralee Albino MD   Signed by:   Doralee Albino MD on 02/15/2010   Method used:   Electronically to        [facility] (retail)       [address]       [place], [place]  [address]       Ph: [phone]       Fax: [phone]   RxID:   [id number] HYDROCHLOROTHIAZIDE 12.5 MG  TABS (HYDROCHLOROTHIAZIDE) Take 1 tab  by mouth every morning  #90 x 3   Entered and Authorized by:   Doralee Albino MD   Signed by:   Doralee Albino MD on 02/15/2010   Method used:   Electronically to        [facility] (retail)       [address]       [place], [place]  [address]       Ph: [phone]       Fax: [phone]   RxID:   [id number] VENTOLIN HFA 108 (90 BASE) MCG/ACT AERS (ALBUTEROL SULFATE) one or two puffs q4h as needed wheezing (rescue)  #1 x 12   Entered and Authorized by:   Doralee Albino  MD   Signed by:   Doralee Albino MD on 02/15/2010   Method used:   Electronically to        [facility] (retail)       [address]       [place], [place]  [address]       Ph: [phone]       Fax: [phone]   RxID:   [id number] QVAR 40 MCG/ACT AERS (BECLOMETHASONE DIPROPIONATE) one puff daily (control)  #1 x 12   Entered and Authorized by:   Doralee Albino MD   Signed by:   Doralee Albino MD on 02/15/2010   Method used:   Electronically to        [facility] (retail)       [address]       [place], [place]  [address]       Ph: [phone]       Fax: [phone]   RxID:   [id number] FLUTICASONE PROPIONATE 50 MCG/ACT SUSP (FLUTICASONE PROPIONATE) one puff each nostril daily  #1 x 12   Entered and Authorized by:   Doralee Albino MD   Signed by:   Doralee Albino MD on 02/15/2010   Method used:   Electronically to        [facility] (retail)       [address]       [place], [place]  [address]       Ph: [phone]       Fax: [phone]   RxID:   [id number]    Orders Added: 1)  Mammogram (Screening) [Mammo] 2)  Pap Smear-[facility] [[address]-32671] 3)  Comp Met-[facility] [[address]-99833] 4)  Flu Vaccine 17yrs + [90658] 5)  Admin 1st Vaccine [90471] 6)  CBC-[facility] [85027] 7)  Lipid-[facility] [80061-22930] 8)  [facility] - Est  40-64 yrs [[address]396]   Immunizations Administered:  Influenza Vaccine # 1:    Vaccine Type: Fluvax 3+  Site: right deltoid    Mfr: GlaxoSmithKline    Dose: 0.5 ml    Route: IM    Given by: Jimmy Footman, CMA    Exp. Date: 08/10/2010    Lot #: [id number]    VIS given: 09/04/09 version given February 15, 2010.  Flu Vaccine Consent Questions:    Do you have a history of severe allergic reactions to this vaccine? no    Any prior history of allergic reactions to egg and/or gelatin? no    Do you have a sensitivity to the preservative Thimersol? no    Do you have a past  history of Guillan-Barre Syndrome? no    Do you currently have an acute febrile illness? no    Have you ever had a severe reaction to latex? no    Vaccine information given and explained to patient? yes    Are you currently pregnant? no   Immunizations Administered:  Influenza Vaccine # 1:    Vaccine Type: Fluvax 3+    Site: right deltoid    Mfr: GlaxoSmithKline    Dose: 0.5 ml    Route: IM    Given by: Jimmy Footman, CMA    Exp. Date: 08/10/2010    Lot #: [id number]    VIS given: 09/04/09 version given February 15, 2010.   Prevention & Chronic Care Immunizations   Influenza vaccine: Fluvax 3+  (02/15/2010)   Influenza vaccine due: 11/08/2008    Tetanus booster: 06/28/2008: Tdap   Tetanus booster due: 06/10/2008    Pneumococcal vaccine: Not documented  Colorectal Screening   Hemoccult: Done.  (05/12/2003)   Hemoccult action/deferral: Not indicated  (02/15/2010)   Hemoccult due: 05/11/2004    Colonoscopy: Results: Normal.  Date is estimate.  Sees Dr. Loreta Ave and gets careful screening because brother died of colon cancer.  She dose every three years   (08/11/2007)   Colonoscopy due: 08/2010  Other Screening   Pap smear: normal  (07/12/2007)   Pap smear action/deferral: Ordered  (02/15/2010)   Pap smear due: 07/12/2010    Mammogram: ASSESSMENT: Negative - BI-RADS 1^MM DIGITAL SCREENING  (10/27/2008)   Mammogram action/deferral: Ordered  (02/15/2010)   Mammogram due: 11/2009   Smoking status: quit  (02/15/2010)  Lipids   Total Cholesterol: 146  (11/09/2007)   LDL: 85  (11/09/2007)   LDL Direct: Not documented   HDL: 41  (11/09/2007)   Triglycerides: 101  (11/09/2007)  Hypertension   Last Blood Pressure: 145 / 100  (02/15/2010)   Serum creatinine: 0.74  (11/09/2007)   Serum potassium 4.1  (11/09/2007) CMP ordered     Hypertension flowsheet reviewed?: Yes   Progress toward BP goal: Unchanged  Self-Management Support :   Personal Goals (by the next clinic visit)  :      Personal blood pressure goal: 140/90  (10/27/2008)   Hypertension self-management support: Written self-care plan  (10/27/2008)   Nursing Instructions: Give Flu vaccine today Schedule screening mammogram (see order) Pap smear today

## 2010-04-03 ENCOUNTER — Telehealth: Payer: Self-pay | Admitting: Family Medicine

## 2010-04-03 NOTE — Telephone Encounter (Signed)
Needs to speak with RN re: pts dx, pt at dental office now trying to have work done.

## 2010-04-03 NOTE — Telephone Encounter (Signed)
Patient is currently at dentist office.  Dental assistant calling b/c patient is telling them she has a diagnosis of "malignant hypothermia" as well as multiple drug allergies (she cannot remember them all).   Reviewed her records and did not see that diagnosis in her problem list.  Did confirm that she does have multiple drug allergies, mostly pain meds.  The dentist office asks, as a safeguard,  that we fax her problem list as well a allergy list.  Faxed records to [phone].

## 2010-04-04 NOTE — Telephone Encounter (Signed)
Noted and agree with Nurse Foster's handling of this issue.  Patient does not have a history of malignant hyperthermia to my knowledge.

## 2010-04-13 ENCOUNTER — Telehealth: Payer: Self-pay | Admitting: Family Medicine

## 2010-04-13 NOTE — Telephone Encounter (Signed)
Pt had root canal done on Monday.  After root canal on upper front tooth, placed on clindamycin.  Had upper lip swelling, dentist switched antibiotic to amoxicillin.  Pt continues to have swelling in upper lip, now having swelling into cheek and nose area.  Pt denies tongue swelling, throat swelling, change in voice, no lower lip swelling.  Able to eat and drink normally. No fever.  Pt called dentist- told by office staff that the dentist is out of the country and that she should call her pcp.  I told pt to go to urgent care since swelling is worsening.

## 2010-06-25 NOTE — Group Therapy Note (Signed)
NAME:  Meghan [name], Meghan [name] NO.:  [id number]   MEDICAL RECORD NO.:  [id number]          PATIENT TYPE:  WOC   LOCATION[facility]                   FACILITY:  [facility]   PHYSICIAN:  Ginger Carne, MD DATE OF BIRTH:  08/25/1960   DATE OF SERVICE:                                  CLINIC NOTE   The patient is here today for routine yearly examination and to  discussed symptoms of hot flashes.  In February 2006 the patient had  undergone uterine artery embolization from interventional radiology.  Since then she has had symptoms of hot flashes and vaginal dryness.  An  FSH obtained in May 2006 demonstrated an FSH of 53.5 and TSH of 0.649  (normal range).  The patient since then has not been approached about  concerns regarding hormone replacement therapy.  I explained to the  patient that following a [place] It is possible to have inadvertent  embolization of the uterine vessels, resulting in a premature menopause.  Otherwise she has not had any bleeding and no other complaints.   VITAL SIGNS:  Per record.  HEENT:  Grossly normal.  BREAST EXAM:  Without masses, discharge, thickenings or tenderness.  CHEST:  Clear to percussion and auscultation.  CARDIOVASCULAR:  Exam without murmurs or enlargements, regular rate and  rhythm.  ABDOMEN:  Soft without gross hepatosplenomegaly.  PELVIC EXAM:  Pap smear performed.  Uterus is about 6 weeks in size,  globular.  Both adnexa palpable found to be normal.   IMPRESSION:  Normal gynecological examination with menopausal  symptomatology.   PLAN:  The patient was given a thorough understanding about risks,  benefits of hormone-replacement therapy.  Will be started on Prempro  0.65 mg daily.  She was advised to contact the office in about 6-8 weeks  if she continues to have symptoms.  The patient requested an HIV test           ______________________________  Ginger Carne, MD     SHB/MEDQ  D:  07/23/2006  T:  07/23/2006   Job:  [id number]

## 2010-06-28 NOTE — Group Therapy Note (Signed)
NAME:  Meghan [name], STALKER NO.:  [id number]   MEDICAL RECORD NO.:  [id number]          PATIENT TYPE:  WOC   [facility]ATION[facility]                   FACILITY:  [facility]   PHYSICIAN:  Tinnie Gens, MD        DATE OF BIRTH:  10/20/1960   DATE OF SERVICE:  01/11/2004                                    CLINIC NOTE   CHIEF COMPLAINT:  Referral for fibroids.   HISTORY OF PRESENT ILLNESS:  The patient is a 50 year old gravida 2 para 2-0-  0-2 who is referred for a fibroid uterus.  She has a longstanding history of  fibroids; however, had a recent one that was degenerating and has decided to  finally have something done about it.  She reports pain as well as heavy  bleeding with anemia secondary to her bleeding fibroids and in fact had IV  iron infusion in the past week.  Her last hemoglobin that I have record of  is 8.1.  The patient underwent CT scanning recently which revealed multiple  fibroids as well as a large one that was 6.7 x 6.9 that was possibly  degenerated.  The patient had a normal Pap smear in April 2005.   PAST MEDICAL HISTORY:  Significant for arthritis, asthma, goiter, anemia,  osteoarthritis, and carpal tunnel.   PAST SURGICAL HISTORY:  BTL, tonsillectomy and adenoidectomy, and  hemorrhoidectomy.   MEDICATIONS:  Include Advair Diskus, albuterol, Allegra, Benzamycin, iron,  Flexeril, Flonase, ibuprofen, multivitamin, and Singulair.   ALLERGIES:  FLAGYL, SULFA, VICODIN, CODEINE, and ANESTHESIA.   SOCIAL HISTORY:  She is divorced with two children and she works as an  Environmental health practitioner.   FAMILY HISTORY:  Significant for hypertension, diabetes, breast cancer.   REVIEW OF SYMPTOMS:  A 14-point review of systems is reviewed and is  negative except for some numbness and weakness in her fingers related to  carpal tunnel, fatigue, headaches and dizzy spells related to her anemia,  some question of fever or night sweats, and some pain with  intercourse.   PHYSICAL EXAMINATION TODAY:  VITAL SIGNS:  Her blood pressure is 116/80,  heart weight is 167.6, pulse is 78.  GENERAL:  She is a well-developed, well-nourished black female in no acute  distress.  ABDOMEN:  Soft, nontender, nondistended.  GENITOURINARY:  Deferred today because the patient is on her cycle.   IMPRESSION:  Fibroid uterus with degeneration and heavy bleeding leading to  anemia.   PLAN:  The patient has expressed to me some reservations regarding going  under anesthesia.  She also does not want full hysterectomy but would  rather have myomectomy although she had completed her childbearing.  After  discussion of all the options including myomectomy versus hysterectomy -  which would be the more appropriate procedure in this patient - the patient  has elected to possibly undergo radiology fibroid ablation.  This was  discussed with the patient and she agreed that this was probably the best  option for her.  The patient has not had endometrial biopsy yet or pelvic  ultrasound.  Pelvic ultrasound should help in the technique with  the  location and the distribution of the fibroids, which will be much better  delineated by pelvic ultrasound rather than CT scanning.  Also, she needs  endometrial biopsy to rule out hyperplasia or endometrial cancer as a cause  for her bleeding and anemia apart from her fibroid uterus.      TP/MEDQ  D:  01/11/2004  T:  01/12/2004  Job:  [id number]   cc:   Sanjuana Letters  Fax: [phone]

## 2010-06-28 NOTE — Group Therapy Note (Signed)
NAME:  Meghan [name], Meghan [name] NO.:  [id number]   MEDICAL RECORD NO.:  [id number]          PATIENT TYPE:  WOC   [facility]ATION[facility]                   FACILITY:  [facility]   PHYSICIAN:  Tinnie Gens, MD        DATE OF BIRTH:  Sep 07, 1960   DATE OF SERVICE:  06/13/2004                                    CLINIC NOTE   CHIEF COMPLAINT:  Hot flashes.   HISTORY OF PRESENT ILLNESS:  The patient is a 50 year old black female who  says she is having hot flashes at night and also having cold chills that are  worse at night.  She said hot flashes __________  day.  She would like  something checked for this, she states she is menopausal.  The patient was  here approximately one month ago and was saying that she had dry eyes and  nose involved secondary to allergies, that I thought was secondary to  allergies; I did not put her on any medicine, but she has since seen a  pharmacist who thought maybe she was having an allergic reaction to  something.  I have since referred her back to family practice for further  workup and treatment of this issue, but I will check a TSH and FSH today to  see if she has thyroid issues versus perimenopausal.  She will follow up in  2 weeks for a discussion of results and possible treatments or further  workup.      TP/MEDQ  D:  06/13/2004  T:  06/13/2004  Job:  [id number]

## 2010-06-28 NOTE — Group Therapy Note (Signed)
NAME:  Meghan [name], REMSBURG NO.:  [id number]   MEDICAL RECORD NO.:  [id number]          PATIENT TYPE:  WOC   [facility]                   [facility]ACILITY[facility]   PHYSICIAN:  Tinnie Gens, MD        DATE OF BIRTH:  04-09-1960   DATE OF SERVICE:  01/25/2004                                    CLINIC NOTE   CHIEF COMPLAINT:  Follow-up for fibroids and endometrial biopsy.   SUBJECTIVE:  The patient continues to complain of increased bleeding with  her periods and significant dysmenorrhea.  These are significantly different  than her prior periods.  She returns today for endometrial biopsy to rule  out any possible malignant cause and to review the results of her recent  ultrasound, and to discuss possible choices for removal of her fibroids.   OBJECTIVE:  VITAL SIGNS:  Noted.  GENERAL:  A very pleasant, thin African-American female who is anxious in  appearance but in otherwise no apparent distress.  PELVIC:  External genitalia is within normal limits.  There are no lesions.  Cervix and vaginal wall are without any lesions and normal in appearance.   PROCEDURE:  Endometrial biopsy was performed.  The speculum was placed and  the cervix was cleaned with Betadine and then sound was placed without  difficulty into the uterus.  Next, endometrial biopsy curette was placed  through the cervical os without difficulty and endometrial biopsy was  obtained.  At end of procedure, after biopsy was taken, the tenaculum was  removed from the anterior lip of the cervix. The patient tolerated the  procedure well.   ASSESSMENT AND PLAN:  Menometrorrhagia secondary likely to fibroids.  Endometrial biopsy performed today to rule out possible malignant cause for  her increased bleeding.  The patient is still very interested in undergoing  radiology fibroid ablation and will set her up with consultation with  radiology to discuss that procedure.  The patient does not wish to lose her  uterus and therefore she if decides not to go with radiology ablation, she  will consider myomectomy.  We will discuss in 2 weeks' time with her the  results of her endometrial biopsy.      SJ/MEDQ  D:  01/25/2004  T:  01/26/2004  Job:  [id number]

## 2010-06-28 NOTE — Procedures (Signed)
[place]  Patient:    Meghan, [name] Visit Number: [id number] MRN: [id number]          Service Type: END Location: ENDO Attending Physician:  Charna Elizabeth Dictated by:   Anselmo Rod, M.D. Proc. Date: 03/10/01 Admit Date:  03/10/2001   CC:         William A. Leveda Anna, M.D.   Procedure Report  DATE OF BIRTH:  Oct 15, 1960  REFERRING PHYSICIAN:  Santiago Bumpers. Leveda Anna, M.D.  PROCEDURE PERFORMED:  Colonoscopy with snare polypectomy x 1.  ENDOSCOPIST:  Anselmo Rod, M.D.  INSTRUMENT USED:  Pediatric Olympus adjustable colonoscope.  INDICATIONS FOR PROCEDURE:  Rectal bleeding, change in bowel habits and family history of colon cancer in brother in a 13 year old African-American female. Rule out colonic polyps, masses, hemorrhoids, etc.  PREPROCEDURE PREPARATION:  Informed consent was procured from the patient. The patient was fasted for eight hours prior to the procedure and prepped with a bottle of magnesium citrate and a gallon of NuLytely the night prior to the procedure.  PREPROCEDURE PHYSICAL:  The patient had stable vital signs.  Neck supple. Chest clear to auscultation.  S1, S2 regular.  Abdomen soft, nondistended, nontender, with normal abdominal bowel sounds.  DESCRIPTION OF PROCEDURE:  The patient was placed in the left lateral decubitus position and sedated with 60 mg of Demerol and 6 mg of Versed intravenously.  Once the patient was adequately sedated and maintained on low-flow oxygen and continuous cardiac monitoring, the Olympus video colonoscope was advanced from the rectum to the cecum without difficulty. The patient had a fairly good prep. A small sessile polyp was snared at 80 cm without difficulty.  No other masses or polyps were noticed.  The procedure was complete up to the cecum.  The ileocecal valve and the appendicular orifice were clearly visualized and photographed.  The patient had moderate sized,  nonbleeding internal hemorrhoids and a small external hemorrhoid appreciated on retroflexion and anal inspection respectively.  IMPRESSION: 1. Small external hemorrhoid. 2. Moderate size, nonbleeding internal hemorrhoids. 3. Small polyp snared from 80 cm. 4. No evidence of diverticulosis.  RECOMMENDATIONS: 1. Await pathology results. 2. No nonsteroidals including aspirin for the next four weeks. 3. Repeat colorectal cancer screening depending on the pathology results.Dictated by:   Anselmo Rod, M.D. Attending Physician:  Charna Elizabeth DD:  03/10/01 TD:  03/10/01 Job: [id number]

## 2010-06-28 NOTE — Op Note (Signed)
NAME:  Meghan [name], Meghan [name]                        ACCOUNT NO.:  [id number]   MEDICAL RECORD NO.:  [id number]                   PATIENT TYPE:  AMB   LOCATION:  ENDO                                 FACILITY:  [facility]   PHYSICIAN:  Anselmo Rod, M.D.               DATE OF BIRTH:  06-06-1960   DATE OF PROCEDURE:  02/23/2002  DATE OF DISCHARGE:                                 OPERATIVE REPORT   PROCEDURE PERFORMED:  Flexible sigmoidoscopy up to 1 meter.   ENDOSCOPIST:  Charna Elizabeth, M.D.   INSTRUMENT USED:  Olympus video colonoscope.   INDICATIONS FOR PROCEDURE:  The patient is a 50 year old African-American  female undergoing flexible sigmoidoscopy.  The patient had a tubular adenoma  removed from 80 cm in January of 2003.  The margins of the polyp on biopsy  were not clearly free of adenomatous change and therefore repeat flexible  sigmoidoscopy is being done to rule out recurrence of the lesion.  The  patient has a history of rectal bleeding in the recent past.   PREPROCEDURE PREPARATION:  Informed consent was procured from the patient.  The patient was fasted for eight hours prior to the procedure and prepped  with a bottle of magnesium citrate the night prior to the procedure.   PREPROCEDURE PHYSICAL:  The patient had stable vital signs.  Neck supple.  Chest clear to auscultation.  S1 and S2 regular.  Abdomen soft with normal  bowel sounds.   DESCRIPTION OF PROCEDURE:  The patient was placed in left lateral decubitus  position.  The patient chose not to receive sedation for the procedure.  Once the patient was adequately positioned, the Olympus video colonoscope  was advanced from the rectum to 100 cm without difficulty.  No masses,  polyps, erosions, ulcerations or diverticula were seen.  The patient had  prominent internal hemorrhoids seen on retroflexion.  No other abnormalities  were noted.  The patient tolerated the procedure well.   IMPRESSION:  Prominent internal  hemorrhoids.  Otherwise normal flexible  sigmoidoscopy up to 100 cm.   RECOMMENDATIONS:  1. Repeat colonoscopy is recommended in three years unless the patient     develops any abnormal symptoms in the interim.  2.     The patient has been scheduled to see Abigail Miyamoto, M.D. tomorrow for     her internal hemorrhoids as she wants to have these surgically taken care     of.  3. Outpatient follow-up in the next two weeks for further recommendation.                                                    Anselmo Rod, M.D.    JNM/MEDQ  D:  02/23/2002  T:  02/23/2002  Job:  [id number]   cc:   Abigail Miyamoto, M.D.  [address]  [place]  [place] [address]  Fax: [phone]

## 2010-06-28 NOTE — Group Therapy Note (Signed)
NAME:  Meghan [name], SARCHET NO.:  [id number]   MEDICAL RECORD NO.:  [id number]          PATIENT TYPE:  WOC   [facility]                   FACILITY[facility]   PHYSICIAN:  Tinnie Gens, MD        DATE OF BIRTH:  07/24/1960   DATE OF SERVICE:  03/28/2004                                    CLINIC NOTE   CHIEF COMPLAINT:  Follow up uterine artery embolization.   HISTORY OF PRESENT ILLNESS:  Patient is a 50 year old gravida 2, para 2 who  was previously seen by me in December for work-up of fibroid uterus and  menorrhagia.  She had endometrial biopsy that was benign.  Underwent uterine  artery embolization as her choice of conservative treatment for fibroid  uterus on March 14, 2004.  Apparently, she has had a fairly difficult  postoperative course requiring multiple trips to the ER and  hospitalizations.  Most recently she began passing tissue two days ago.  I  was contacted by Dr. Oley Balm of interventional radiology to provide a  D&C for her to decrease infection risk and she is here today to schedule  that.   The patient is here today with multiple questions about certain findings on  CT scan including __________ spleen, liver, kidney, and ovary.   The patient states she is having some fevers and chills at home.  She has  also had abdominal pain, some bleeding, and this passage of tissue.   The patient's past medical, surgical, family, social, allergies, and  medications were all reviewed.  They are updated and are in the surgical  H&P.   PHYSICAL EXAMINATION:  VITAL SIGNS:  As noted in the chart.  GENERAL:  She is a well-developed, well-nourished black female in no acute  distress.  HEENT:  Normocephalic, atraumatic.  Sclerae anicteric.  NECK:  Supple.  LUNGS:  Clear.  HEART:  Regular S1, S2.  No murmur.  ABDOMEN:  Soft.  Mildly tender in lower quadrants bilaterally.  EXTREMITIES:  No clubbing, cyanosis, edema.  SKIN:  No rash.    IMPRESSION:  1.  Status post uterine artery embolization for treatment of fibroid uterus.  2.  Submucosal degeneration of fibroid passage of tissue and increased      infection risk.  3.  Ovarian cysts.  4.  Cysts on the liver and kidney.  5.  Infarcted spleen.  6.  Sickle cell trait.  7.  Other multiple medical problems.   PLAN:  1.  Doxycycline 100 mg one p.o. b.i.d. x2 weeks.  2.  D&C is scheduled for Monday, February 20.  3.  Have discussed this patient at length with anesthesia and she will be      done under regional or local plus MAC so that malignant hypothermia will      not be a problem.  4.  Discussed most cysts on liver and kidneys were usually benign.  5.  Discussion of how sickle cell disease and sickle cell trait could lead      to infarction of the spleen and possible need for further immunization.  6.  Question  ovarian cyst noted on CT scan.  Patient did have a pelvic      ultrasound on December 5 that showed normal ovaries.  My plan would be      to follow up this CT finding with ultrasound in approximately three      months.  7.  The patient is instructed to come to [facility] should she have      problems with high spiking fevers, chills, nausea and vomiting, or      change in status over the weekend.      TP/MEDQ  D:  03/28/2004  T:  03/29/2004  Job:  [id number]   cc:   Glenis Smoker, M.D.   Raelene Bott, M.D.  [address]

## 2010-06-28 NOTE — Op Note (Signed)
   NAME:  Meghan [name], Meghan [name]                        ACCOUNT NO.:  [id number]   MEDICAL RECORD NO.:  [id number]                   PATIENT TYPE:  AMB   LOCATION:  DAY                                  FACILITY:  [facility]   PHYSICIAN:  Abigail Miyamoto, M.D.              DATE OF BIRTH:  [birth year]   DATE OF PROCEDURE:  03/04/2002  DATE OF DISCHARGE:                                 OPERATIVE REPORT   PREOPERATIVE DIAGNOSES:  Splitting hemorrhoids.   POSTOPERATIVE DIAGNOSES:  Splitting hemorrhoids.   PROCEDURE:  Hemorrhoidectomy.   SURGEON:  Abigail Miyamoto, M.D.   ANESTHESIA:  Spinal, 1% lidocaine with epinephrine, 0.25% Marcaine with  epinephrine.   ESTIMATED BLOOD LOSS:  Minimal.   DESCRIPTION OF PROCEDURE:  The patient was brought to the operating room,  identified as [name]. Spinal epidural was instituted and the patient  was placed in the lithotomy position. Her perianal area was then prepped and  draped in the usual sterile fashion. It was then anesthetized further with  1% lidocaine with epinephrine. The Hill-Ferguson retractor was then inserted  into the anal canal. The patient was found to have large anterior anal skin  tags and hemorrhoids. The rest of the anal canal appeared normal. The  enlarged anterior hemorrhoidal column was then completely excised with the  electrocautery and sent to pathology for identification. The defect created  was then closed with a running 2-0 Chromic suture. A separate small skin tag  was excised with the electrocautery. No mucosal closure was needed there.  The perianal area was then anesthetized with 0.25% Marcaine with  epinephrine. A piece of Gelfoam was then placed into the anal canal. The  patient tolerated the procedure well. All sponge, needle and instrument  counts were correct at the end of the procedure. The patient was then taken  in stable condition from the operating room to the recovery room.                      Abigail Miyamoto, M.D.    DB/MEDQ  D:  03/04/2002  T:  03/04/2002  Job:  [id number]

## 2010-06-28 NOTE — H&P (Signed)
NAME:  Meghan [name], Meghan [name] NO.:  [id number]   MEDICAL RECORD NO.:  [id number]          PATIENT TYPE:  INP   LOCATION:  6735                         FACILITY:  [facility]   PHYSICIAN:  Wayne A. Sheffield Slider, M.D.    DATE OF BIRTH:  20-Aug-1960   DATE OF ADMISSION:  03/19/2004  DATE OF DISCHARGE:                                HISTORY & PHYSICAL   CHIEF COMPLAINT:  Abdominal pain.   HISTORY OF PRESENT ILLNESS:  This is a 50 year old African American female  with 2-day history of abdominal pain, gradual onset, diffuse, cramping and  then became severe and more localized to suprapubic and right lower  quadrant, and positive nausea, positive decreased appetite, no emesis, no  diarrhea.  She is status post fibroid embolization 4 days ago. Now her pain  is a 20/10, initially pain treated with ibuprofen, but now 20/10, despite  morphine in the ED.  No bowel movement since surgery, has had some vaginal  bleeding, mucusy, of small amounts since the procedure.   REVIEW OF SYSTEMS:  Review of systems positive for low-grade fever to 100.3,  decreased appetite; positive for constipation; negative for cough, shortness  of breath, rash, dark or bloody stools, diarrhea, joint pain or back pain;  positive for vaginal bleeding.   PAST MEDICAL HISTORY:  1.  Uterine fibroids.  2.  Goiter.  3.  Anemia.  4.  Rhinitis.  5.  Rhinorrhea.  6.  Osteoarthritis.  7.  Asthma.  8.  Menorrhagia.  9.  Dysmenorrhea.  10. Abnormal thyroid function tests.  11. Carpal tunnel.   PROCEDURES:  1.  Tubal ligation.  2.  T&A.  3.  Hemorrhoidectomy.  4.  December '05, endometrial biopsy.  5.  January '06, fibroid embolization.   MEDICATIONS:  1.  Advair Diskus 250/50 mcg b.i.d.  2.  Albuterol MDI p.r.n.  3.  Allegra 180 mg p.o. daily.  4.  __________  b.i.d.  5.  Ferrous sulfate 325 mg p.o. t.i.d.  6.  Flexeril 10 mg p.o. nightly p.r.n.  7.  Flonase 1 spray in each nostril daily.  8.  Ibuprofen for  dysmenorrhea.  9.  Multivitamin.  10. Singulair 10 mg p.o. daily.   ALLERGIES:  FLAGYL, SULFA and CODEINE give her a rash.   FAMILY HISTORY:  Hypertension and diabetes in maternal grandmother; breast  cancer in great aunt; no significant CAD, CVA or other cancer.   SOCIAL HISTORY:  Divorced.  Two children, female.  Works as an  Environmental health practitioner.   EXAM:  VITALS:  T current 99.2, T-max 100.3; systolic blood pressures  between 126 and 159, diastolics 80-98; heart rate 76-98; respirations 16-20.  GENERAL:  The patient appears to be uncomfortable, alert and oriented x3.  HEENT:  Extraocular muscles intact.  Pupils equal, round and reactive to  light.  Nares parent.  Moist mucous membranes.  NECK:  Thyromegaly.  No lymphadenopathy.  RESPIRATORY:  Clear to auscultation bilaterally.  No increased work of  breathing.  No clubbing or cyanosis.  CV:  On auscultation of the heart, murmur appreciated by Dr. Ursula Beath,  regular rate.  No edema.  ABDOMEN:  Mildly distended.  Decreased bowel sounds.  Tender to palpation,  occasional voluntary guarding, right lower quadrant greater than left lower  quadrant, pain predominantly suprapubic and left and right lower quadrants.  No groin lymphadenopathy.  No incision site appreciated.  NEUROLOGIC:  Cranial nerves II-XII intact.  RECTAL:  Hemoccult pending.   LABORATORIES:  White blood count 3.5, hemoglobin 11.9.  Potassium is 3.1,  sodium 139, chloride 103, bicarb 31, BUN 4, creatinine 0.7, glucose 109.  UA:  Cloudy, positive large hemoglobin, 15 ketones and large leukocyte  esterase.  Microscopy:  Many squamous epitheliums.  Repeat UA was pan-  negative except for trace ketones.  Beta hCG negative.   CT showed questionable splenic infarct, air in the uterus consistent with  recent procedure.   Abdominal x-ray showed transverse ileus.   ASSESSMENT AND PLAN:  This is a 50 year old female with:   1.  Abdominal pain.  The patient is  status post fibroid embolization, now      with transverse ileus, no bowel movement since procedure, KUB not      suggestive of constipation, occasional voluntary guarding, 20/10 pain      despite morphine in emergency department, positive abdominal distention,      pain secondary to ileus versus infarct versus ectopic, beta hCG      negative, so that is unlikely, versus constipation.  No increased white      blood cells and requesting orals, therefore, appendicitis is unlikely.      CT shows questionable infarct of spleen, but pain is greater in the      right lower quadrant.  Gallbladder disease unlikely, as the patient      denies pain associated with food and pain is in the right lower      quadrant.  Pain is not epigastric in nature.  No history of      gastroesophageal reflux disease.  Questionable diverticulitis.  No blood      in the stool.  We will treat the patient with Toradol, Phenergan, bowel      rest, check a Hemoccult and follow up with the surgeon, follow up with      General Surgery if the pain does not subside.  2.  Hypokalemic at 3.1.  We will replete and recheck in the morning.  3.  Asthma, stable on Advair and albuterol.  4.  Status post uterine fibroid embolization.  Mild vaginal bleeding,      __________ to be expected.  Consider contacting Dr. Thora Lance,      who performed the surgery, if vaginal bleeding increases.      MBV/MEDQ  D:  03/19/2004  T:  03/19/2004  Job:  [id number]

## 2010-06-28 NOTE — Op Note (Signed)
NAME:  Meghan [name], CHRISTIANO              ACCOUNT NO.:  [id number]   MEDICAL RECORD NO.:  [id number]          PATIENT TYPE:  AMB   LOCATION:  SDC                           FACILITY:  [facility]   PHYSICIAN:  Tanya S. Shawnie Pons, M.D.   DATE OF BIRTH:  1960/10/13   DATE OF PROCEDURE:  04/01/2004  DATE OF DISCHARGE:                                 OPERATIVE REPORT   PREOPERATIVE DIAGNOSIS:  Status post uterine artery embolization by  interventional radiology on March 14, 2004, passing of tissue which could  be a nidus of infection.   POSTOPERATIVE DIAGNOSIS:  Status post uterine artery embolization by  interventional radiology on March 14, 2004, passing of tissue which could  be a nidus of infection.   PROCEDURE:  Dilatation and curettage.   SURGEON:  Shelbie Proctor. Shawnie Pons, M.D.   ANESTHESIA:  MAC and local.   SPECIMENS:  Endometrial curettings.   FINDINGS:  The uterus sounded to approximately 11 cm.  Normal-appearing  cervix.   ESTIMATED BLOOD LOSS:  Minimal.   COMPLICATIONS:  None.   REASON FOR PROCEDURE:  Briefly, the patient is a 50 year old G2, P2, who has  a fibroid uterus, who did not desire hysterectomy.  She underwent uterine  artery embolization in interventional radiology on March 14, 2004.  She  began passing fragments of tissue on February 14.  Radiology felt that she  needed a D&C to decrease infection risk as well as to be placed on  antibiotics.  The patient did not have fever or white count at the time of  this procedure.   PROCEDURE:  The patient was taken to the OR.  She was placed in dorsal  lithotomy in [place] stirrups.  She was prepped and draped in the usual  sterile fashion.  A red rubber catheter was used to drain her bladder.  A  speculum was then placed inside the vagina and the cervix visualized.  A  cervical block was done with 1% lidocaine with epinephrine.  The cervix was  dilated and then sounded to 11 cm.  Sequential dilators were then used to  create a  cervical opening large enough to pass the curette.  A medium-sized  curette was then passed into the endometrium  and curettings were done until there was a cry in all four quadrants.  Tissue was removed and sent to pathology.  All instruments were then removed  from the vagina.  All instrument, needle and lap counts were correct x2.  The patient was awakened and taken to the recovery room in stable condition.      TSP/MEDQ  D:  04/01/2004  T:  04/01/2004  Job:  [id number]

## 2010-06-28 NOTE — Discharge Summary (Signed)
NAME:  Meghan [name], Meghan [name] NO.:  [id number]   MEDICAL RECORD NO.:  [id number]          PATIENT TYPE:  WOC   LOCATION:  WOC                          FACILITY:  [facility]   PHYSICIAN:  Wayne A. Sheffield Slider, M.D.    DATE OF BIRTH:  Aug 13, 1960   DATE OF ADMISSION:  DATE OF DISCHARGE:                                 DISCHARGE SUMMARY   DISCHARGE DIAGNOSES:  1.  Abdominal pain secondary to prolonged ileus.  2.  Status post uterine embolization in January 2006.  3.  Asthma.   SECONDARY DIAGNOSES:  1.  History of uterine fibroids.  2.  Goiter.  3.  Chronic anemia.  4.  Rhinosinusitis.  5.  Osteoarthritis.  6.  Menorrhagia/dysmenorrhea.  7.  Carpal tunnel syndrome.   DISCHARGE MEDICATIONS:  1.  Advair Discus 250/50 mcg one puff b.i.d.  2.  Albuterol MDI q. 4h. p.r.n. for asthma.  3.  Allegra 180 mg p.o. daily.  4.  Ferrous sulfate 225 mg t.i.d.  5.  Flexeril 10 mg at bedtime.  6.  Flonase one spray in each nostril daily.  7.  Ibuprofen 600 mg q. 6h. p.r.n.  8.  Multivitamin one tablet p.o. daily.  9.  Singulair 10 mg p.o. daily.  10. MiraLax 17 grams dissolved in water p.o. daily for no more than 2 weeks      in a row, used as needed for constipation.   PROCEDURES:  1.  Acute abdominal series with chest x-ray that showed moderate ileus of      the transverse colon, April 6.  2.  CT scan of the pelvis that showed some air in the endometrial cavity      with some mild edema of the wall of the uterus, likely normal      postoperative appearance.  Enlargement of the left ovary, probably due      to cysts.  No free fluid or evidence of abscess in the pelvic cavity.      Numerous fibroids noted.   HOSPITAL COURSE:  1.  Meghan [name] is a 50 year old African-American female who came in with      2-day history of abdominal pain, gradual onset, diffuse, and cramping in      character.  Also she presented with positive nausea, decreased appetite,      and constipation.  The  patient came in status post fibroid embolization      4 days prior to admission.  CT scan and abdominal series showed that      there were no signs of intestinal obstruction or pelvic abscess.  The      patient was admitted for 23-hour observation.  Her abdominal pain was      believed secondary to prolonged ileus due to pain, given that the      patient was status post fibroid embolization.  On discharge date, the      pain was improving.  The patient was passing gas, tolerating p.o., and      ambulating.  She had about 3 bowel movements prior to discharge.  She  was recommended to continue to use ibuprofen on a p.r.n. basis for pain      and to follow up with Dr. Deanne Coffer, interventional radiologist who      performed the uterine embolization.  2.  ID: The patient presented with a low-grade fever on admission.  On      discharge day, she was afebrile.  Her white blood cell count on      admission was 11.5, and she was discharged with a white blood cell count      of 9.9.  No antibiotic therapy was started during hospital admission.  3.  __________ Meghan [name]: The patient's diet was advanced as tolerated.  On      discharge date, the patient was tolerating p.o. without emesis and was      drinking plenty of fluids and blands.  She had mild hypokalemia on      admission, which was completely resolved on discharge date.  4.  Asthma, remained stable on home regime.  5.  Chronic anemia, remained stable.  Home regime was continued.  6.  The patient was discharged home in improved and stable condition.      AM/MEDQ  D:  06/09/2004  T:  06/09/2004  Job:  [id number]

## 2010-09-13 ENCOUNTER — Encounter: Payer: Self-pay | Admitting: Family Medicine

## 2010-09-13 ENCOUNTER — Ambulatory Visit (INDEPENDENT_AMBULATORY_CARE_PROVIDER_SITE_OTHER): Payer: BC Managed Care – PPO | Admitting: Family Medicine

## 2010-09-13 VITALS — BP 127/98 | HR 73 | Temp 98.1°F | Ht 66.5 in | Wt 185.3 lb

## 2010-09-13 DIAGNOSIS — Z1239 Encounter for other screening for malignant neoplasm of breast: Secondary | ICD-10-CM

## 2010-09-13 DIAGNOSIS — T883XXA Malignant hyperthermia due to anesthesia, initial encounter: Secondary | ICD-10-CM

## 2010-09-13 DIAGNOSIS — I10 Essential (primary) hypertension: Secondary | ICD-10-CM

## 2010-09-13 DIAGNOSIS — J45909 Unspecified asthma, uncomplicated: Secondary | ICD-10-CM

## 2010-09-13 DIAGNOSIS — Z1231 Encounter for screening mammogram for malignant neoplasm of breast: Secondary | ICD-10-CM

## 2010-09-13 MED ORDER — METOPROLOL TARTRATE 25 MG PO TABS: 25.0000 mg | ORAL_TABLET | Freq: Two times a day (BID) | ORAL | Status: DC

## 2010-09-13 MED ORDER — ALBUTEROL 90 MCG/ACT IN AERS: 2.0000 | INHALATION_SPRAY | RESPIRATORY_TRACT | Status: DC | PRN

## 2010-09-13 MED ORDER — FLUTICASONE-SALMETEROL 250-50 MCG/DOSE IN AEPB: 1.0000 | INHALATION_SPRAY | Freq: Two times a day (BID) | RESPIRATORY_TRACT | Status: DC

## 2010-09-13 NOTE — Assessment & Plan Note (Signed)
Stable, refill meds

## 2010-09-13 NOTE — Assessment & Plan Note (Signed)
Added to problem list because important

## 2010-09-13 NOTE — Assessment & Plan Note (Signed)
Poor control.  Add LOW dose B blocker. Watch for any asthma worsening.  Home BPs

## 2010-09-13 NOTE — Progress Notes (Signed)
  Subjective:    Patient ID: Meghan [name], female    DOB: 29-Nov-1960, 50 y.o.   MRN: [id number]  HPI BP high.  Under stress and BP runs up with stress.  Also having headaches and wonders if migraine or 2 to HBP.  No chest pain. Had allergic reaction to dental anesthesia.  She will find out the medicine so I can add to allergy list Asthma well controled Due for mammo    Review of Systems     Objective:   Physical Exam Lungs clear Cardiac RRR without m or g       Assessment & Plan:

## 2010-09-13 NOTE — Patient Instructions (Signed)
Get your mammogram done New blood pressure med at pharmacy Make sure asthma doesn't get worse Keep a close eye on BP call if problems Due for annual checkup in mid Jan

## 2010-09-23 ENCOUNTER — Telehealth: Payer: Self-pay | Admitting: Family Medicine

## 2010-09-23 DIAGNOSIS — R51 Headache: Secondary | ICD-10-CM

## 2010-09-23 DIAGNOSIS — I10 Essential (primary) hypertension: Secondary | ICD-10-CM

## 2010-09-23 NOTE — Telephone Encounter (Signed)
Was given the prescription for Lopressor which is to be taken 2 x daily, and she would like something that needs to be taken 1 time per day.  She is having headaches that are concerning to her.  She is wondering if Dr. Leveda Anna would order an MRI.  She has a history of strokes in her family and wants to be sure that it isn't a stroke or aneurism.  Uses [facility] on [address].

## 2010-10-01 ENCOUNTER — Telehealth: Payer: Self-pay | Admitting: *Deleted

## 2010-10-01 DIAGNOSIS — R519 Headache, unspecified: Secondary | ICD-10-CM | POA: Insufficient documentation

## 2010-10-01 DIAGNOSIS — R51 Headache: Secondary | ICD-10-CM | POA: Insufficient documentation

## 2010-10-01 MED ORDER — METOPROLOL SUCCINATE ER 50 MG PO TB24: 50.0000 mg | ORAL_TABLET | Freq: Every day | ORAL | Status: DC

## 2010-10-01 NOTE — Telephone Encounter (Signed)
Meghan [name] called Dr. Leveda Anna back.  I tried to transfer but the phone was busy.  She would like Dr. Leveda Anna to call her back.

## 2010-10-01 NOTE — Telephone Encounter (Signed)
Approval # [id number] for this procedure CT head w/o contrast (54098) this is good for 30 days from today.Laureen Ochs, Viann Shove

## 2010-10-01 NOTE — Telephone Encounter (Signed)
lvm for pt. Pt has an appt 08.24.2012 945 AM with [facility] [address] to have CT w/o contrast of the head. If she cannot make this appt she will need to call [phone] to cancel/reschedule.Laureen Ochs, Viann Shove

## 2010-10-01 NOTE — Assessment & Plan Note (Signed)
Switch to once daily metoprolol per patient request

## 2010-10-01 NOTE — Telephone Encounter (Signed)
Called pt to confirm her receiving the VM concerning her appt on 8.24. Pt stated that this appt will not work. Gave her the phone number for [facility] so that she can reschedule at her convenience .Marland KitchenMarland KitchenLoralee Pacas [place].

## 2010-10-04 ENCOUNTER — Other Ambulatory Visit: Payer: BC Managed Care – PPO

## 2010-10-08 ENCOUNTER — Inpatient Hospital Stay: Admission: RE | Admit: 2010-10-08 | Payer: BC Managed Care – PPO | Source: Ambulatory Visit

## 2010-10-08 ENCOUNTER — Ambulatory Visit
Admission: RE | Admit: 2010-10-08 | Discharge: 2010-10-08 | Disposition: A | Discharge status: Home / Self Care | Payer: BC Managed Care – PPO | Source: Ambulatory Visit | Attending: Family Medicine | Admitting: Family Medicine

## 2010-10-08 DIAGNOSIS — Z1231 Encounter for screening mammogram for malignant neoplasm of breast: Secondary | ICD-10-CM

## 2010-10-08 DIAGNOSIS — R51 Headache: Secondary | ICD-10-CM

## 2010-10-10 ENCOUNTER — Telehealth: Payer: Self-pay | Admitting: Family Medicine

## 2010-10-10 NOTE — Telephone Encounter (Signed)
Please call patient back for Ct scan results

## 2010-10-10 NOTE — Telephone Encounter (Signed)
Will forward to Dr Hensel 

## 2010-10-14 NOTE — Telephone Encounter (Signed)
Called and left message that results of CT of head and mammogram were both normal.

## 2010-12-19 ENCOUNTER — Encounter: Payer: Self-pay | Admitting: Family Medicine

## 2010-12-19 NOTE — Progress Notes (Signed)
  Subjective:    Patient ID: Meghan [name], female    DOB: 1960-05-20, 50 y.o.   MRN: [id number]  HPI Allergies updated based on 11/29/10 visit to allergist, Kozlow.    Review of Systems     Objective:   Physical Exam        Assessment & Plan:

## 2011-03-01 ENCOUNTER — Other Ambulatory Visit: Payer: Self-pay | Admitting: Family Medicine

## 2011-03-02 NOTE — Telephone Encounter (Signed)
Refill request

## 2011-08-07 ENCOUNTER — Other Ambulatory Visit: Payer: Self-pay | Admitting: Family Medicine

## 2011-08-07 NOTE — Telephone Encounter (Signed)
Patient is calling because she had been prescribed an ointment for her face of which she doesn't remember the name of.  She is hoping that Dr. Leveda Anna will recall the name of it and send a refill to [facility] on [address].  She has not been in since August of last year, I attempted to encourage her to make an appointment to be seen for this issue, but she didn't want to do that.

## 2011-08-08 NOTE — Telephone Encounter (Signed)
Called and discussed.  She has known asthma and allergies.  Moving in with someone who just got rid of a cat.  They are doing their best to clean the environment.  She will take regular zyrtec and use OTC hydrocortisone cream.  She is aware to watch for flair of asthma.

## 2011-08-27 ENCOUNTER — Other Ambulatory Visit (HOSPITAL_COMMUNITY)
Admission: RE | Admit: 2011-08-27 | Discharge: 2011-08-27 | Disposition: A | Discharge status: Home / Self Care | Payer: BC Managed Care – PPO | Source: Ambulatory Visit | Attending: Family Medicine | Admitting: Family Medicine

## 2011-08-27 ENCOUNTER — Encounter: Payer: Self-pay | Admitting: Family Medicine

## 2011-08-27 ENCOUNTER — Ambulatory Visit (INDEPENDENT_AMBULATORY_CARE_PROVIDER_SITE_OTHER): Payer: BC Managed Care – PPO | Admitting: Family Medicine

## 2011-08-27 VITALS — BP 128/82 | HR 88 | Temp 98.6°F | Wt 184.0 lb

## 2011-08-27 DIAGNOSIS — N760 Acute vaginitis: Secondary | ICD-10-CM

## 2011-08-27 DIAGNOSIS — Z202 Contact with and (suspected) exposure to infections with a predominantly sexual mode of transmission: Secondary | ICD-10-CM

## 2011-08-27 DIAGNOSIS — Z2089 Contact with and (suspected) exposure to other communicable diseases: Secondary | ICD-10-CM

## 2011-08-27 DIAGNOSIS — Z20828 Contact with and (suspected) exposure to other viral communicable diseases: Secondary | ICD-10-CM

## 2011-08-27 DIAGNOSIS — N898 Other specified noninflammatory disorders of vagina: Secondary | ICD-10-CM

## 2011-08-27 DIAGNOSIS — Z113 Encounter for screening for infections with a predominantly sexual mode of transmission: Secondary | ICD-10-CM | POA: Insufficient documentation

## 2011-08-27 LAB — POCT WET PREP (WET MOUNT)
Clue Cells Wet Prep Whiff POC: NEGATIVE
WBC, Wet Prep HPF POC: >20

## 2011-08-27 LAB — HIV ANTIBODY (ROUTINE TESTING W REFLEX): HIV: NONREACTIVE

## 2011-08-27 LAB — RPR

## 2011-08-27 MED ORDER — FLUCONAZOLE 150 MG PO TABS: 150.0000 mg | ORAL_TABLET | Freq: Once | ORAL | Status: AC

## 2011-08-27 NOTE — Progress Notes (Signed)
  Subjective:    Patient ID: Meghan [name], female    DOB: April 19, 1960, 51 y.o.   MRN: [id number]  HPI  Patient presents to same day clinic for vaginal discharge and itching x 3 days.  Discharge is light green, no foul odor.  Denies any associated vaginal bleeding, fevers, pelvic pain, or low back pain.  No decreased appetite.  Itching located outer skin of vagina.  She wants to be checked for all STD and infections.  Has had one partner in the last 2 months.  Has had unprotected sex with this partner.  Review of Systems  Per HPI    Objective:   Physical Exam  Constitutional: No distress.       pleasant  Genitourinary: There is no rash, tenderness or lesion on the right labia. There is no rash, tenderness or lesion on the left labia. Cervix exhibits discharge. Cervix exhibits no motion tenderness and no friability. Right adnexum displays no mass, no tenderness and no fullness. Left adnexum displays no mass, no tenderness and no fullness. No erythema, tenderness or bleeding around the vagina. Vaginal discharge found.          Assessment & Plan:

## 2011-08-27 NOTE — Patient Instructions (Signed)
It was nice to meet you Meghan [name]. I will send Diflucan to your pharmacy. Take one tablet today. We will call you in about 48 hours with lab results. Please continue to use condoms in the meantime. If you develop fever, chills, nausea/vomiting, or pelvic pain, please return to clinic.

## 2011-08-27 NOTE — Assessment & Plan Note (Signed)
Patient will like to be checked for syphilis and HIV. Will call patient results in the next few days.

## 2011-08-27 NOTE — Assessment & Plan Note (Signed)
Patient would like to be checked for gonorrhea, Chlamydia, wet prep. Will call patient with results in 48 hours. Patient returns later she develops fever, chills, nausea or vomiting, pelvic or lower back pain.

## 2011-09-01 ENCOUNTER — Encounter: Payer: Self-pay | Admitting: Family Medicine

## 2011-09-01 ENCOUNTER — Telehealth: Payer: Self-pay | Admitting: Family Medicine

## 2011-09-01 MED ORDER — FLUCONAZOLE 150 MG PO TABS: 150.0000 mg | ORAL_TABLET | Freq: Once | ORAL | Status: AC

## 2011-09-01 NOTE — Telephone Encounter (Signed)
Called patient back with results.  She still complains of mild itching, but discharge improving.  Recommended OTC ointment for yeast infection and sent second dose of Diflucan to pharmacy.  Patient to return to clinic if symptoms worsen.

## 2011-09-01 NOTE — Telephone Encounter (Signed)
Called pt and told her of the recommendations that Dr.de la Sondra Come suggested and also that she sent another rx for diflucan to her pharm. Should her SX persist or worsen she was asked to come back for re check pt voiced understanding and agreed.Meghan [name] [place]

## 2011-09-04 ENCOUNTER — Other Ambulatory Visit: Payer: Self-pay | Admitting: Family Medicine

## 2011-10-08 ENCOUNTER — Encounter: Payer: Self-pay | Admitting: Family Medicine

## 2011-10-08 ENCOUNTER — Ambulatory Visit (INDEPENDENT_AMBULATORY_CARE_PROVIDER_SITE_OTHER): Payer: BC Managed Care – PPO | Admitting: Family Medicine

## 2011-10-08 VITALS — BP 121/74 | HR 86 | Temp 98.0°F | Wt 179.0 lb

## 2011-10-08 DIAGNOSIS — I10 Essential (primary) hypertension: Secondary | ICD-10-CM

## 2011-10-08 NOTE — Patient Instructions (Addendum)
Great work on exercise and weight loss I will call with blood work results. Check with the breast center.  My records indicate it has been one year since your last mammogram You are up to date on health maintenance.  You got a pneumonia vaccine today.  You should get one every 5-8 years until age 51. If things are going great, seeing me once a year is fine.

## 2011-10-09 LAB — BASIC METABOLIC PANEL
BUN: 13 mg/dL (ref 6–23)
Chloride: 104 mEq/L (ref 96–112)
Potassium: 3.2 mEq/L — ABNORMAL LOW (ref 3.5–5.3)
Sodium: 141 mEq/L (ref 135–145)

## 2011-10-15 NOTE — Assessment & Plan Note (Signed)
Well controled, cont current meds.  I did call with lab results and told to increase dietary potassium

## 2011-10-15 NOTE — Progress Notes (Signed)
  Subjective:    Patient ID: Meghan [name], female    DOB: Feb 23, 1960, 51 y.o.   MRN: [id number]  HPI  Doing well.  Happy, working.  Loves her grandson.  Has some concerns about her daughter's parenting.  No other complaints. Here for BP check. Health main noted.    Review of Systems Eating healthy and exercising.     Objective:   Physical Exam Lungs, clear Cardiac RRR       Assessment & Plan:

## 2011-11-01 ENCOUNTER — Telehealth: Payer: Self-pay | Admitting: Family Medicine

## 2011-11-01 DIAGNOSIS — I10 Essential (primary) hypertension: Secondary | ICD-10-CM

## 2011-11-01 MED ORDER — METOPROLOL SUCCINATE ER 50 MG PO TB24: 50.0000 mg | ORAL_TABLET | Freq: Every day | ORAL | Status: DC

## 2011-11-01 NOTE — Telephone Encounter (Signed)
Called emergency line today for refill on BP medicine, Metoprolol. She states she is completely out and was expecting refills at the pharmacy. I informed patient we do not do medication refills over the weekend. She had an office visit with Dr. Leveda Anna in August, and thought refills were sent at that time. I am refilling one month supply of Metoprolol with no refills, per Dr. Cyndia Skeeters last assessment and plan. Patient is very appreciative of this. All other refills will need to be done through Dr. Leveda Anna and patient agrees.  Demiyah Fischbach M. Mikeisha Lemonds, M.D. 11/01/2011 2:17 PM

## 2011-11-12 ENCOUNTER — Other Ambulatory Visit: Payer: Self-pay | Admitting: Family Medicine

## 2011-11-12 DIAGNOSIS — Z1231 Encounter for screening mammogram for malignant neoplasm of breast: Secondary | ICD-10-CM

## 2011-11-21 ENCOUNTER — Ambulatory Visit: Payer: BC Managed Care – PPO

## 2011-11-24 ENCOUNTER — Other Ambulatory Visit: Payer: Self-pay | Admitting: Family Medicine

## 2011-11-24 DIAGNOSIS — I10 Essential (primary) hypertension: Secondary | ICD-10-CM

## 2011-11-24 MED ORDER — METOPROLOL SUCCINATE ER 50 MG PO TB24: 50.0000 mg | ORAL_TABLET | Freq: Every day | ORAL | Status: DC

## 2011-11-24 NOTE — Telephone Encounter (Signed)
Patient is calling because she is out of her Metoprolol and needs a new Rx sent to [facility] on [address].  The dose was changed from twice a day, but then it was changed to once a day.

## 2011-11-24 NOTE — Telephone Encounter (Signed)
A new rx was sent to pharmacy on 9/21 with the correct dosage. Patient needs refill, can I go ahead and refill this medication or will patient need to come in for a OV first

## 2011-12-22 ENCOUNTER — Ambulatory Visit: Payer: BC Managed Care – PPO

## 2012-01-13 ENCOUNTER — Encounter: Payer: Self-pay | Admitting: Family Medicine

## 2012-01-13 ENCOUNTER — Ambulatory Visit (INDEPENDENT_AMBULATORY_CARE_PROVIDER_SITE_OTHER): Payer: BC Managed Care – PPO | Admitting: Family Medicine

## 2012-01-13 VITALS — BP 117/76 | HR 50 | Temp 98.0°F | Ht 66.5 in | Wt 184.0 lb

## 2012-01-13 DIAGNOSIS — M549 Dorsalgia, unspecified: Secondary | ICD-10-CM

## 2012-01-13 HISTORY — DX: Dorsalgia, unspecified: M54.9

## 2012-01-13 MED ORDER — CYCLOBENZAPRINE HCL 5 MG PO TABS: 5.0000 mg | ORAL_TABLET | Freq: Three times a day (TID) | ORAL | Status: DC | PRN

## 2012-01-13 NOTE — Progress Notes (Signed)
  Subjective:    Patient ID: [name], female    DOB: 25-Aug-1960, 51 y.o.   MRN: [id number]  HPI Patient is a 51 yo female presenting with back pain for one day.  States noted twinge in back yesterday when getting up from chair at work. Was ok overnight. Got in car this morning and had sharp pain shoot across low back. Hurts with any movement. Feels tight in the middle of her back. Denies numbness, changes in urination or BM, and saddle anesthesia. Has not taken any medications for this. In the past flexeril and heating pads have worked for her back pain.    Review of Systems see HPI     Objective:   Physical Exam  Constitutional: She appears well-developed and well-nourished.  Musculoskeletal:       Back exam: tender to palpation in bilateral paraspinous muscles, also tightness noted. No tenderness noted over spinous processes.   Neurological:       LE exam: sensation to light touch intact, 2+ patellar reflex noted bilaterally, 5/5 strength bilaterally          Assessment & Plan:

## 2012-01-13 NOTE — Patient Instructions (Signed)
Nice to meet you today. Sorry your back has been bothering you. I sent a prescription for flexeril to your pharmacy.  I would also recommend using ibuprofen 800 mg every 6 hours as needed and using heating pads. I have put in a referral for physical therapy. Someone should contact you to schedule and appointment.  Back Pain, Adult Back pain is very common. The pain often gets better over time. The cause of back pain is usually not dangerous. Most people can learn to manage their back pain on their own.  HOME CARE   Stay active. Start with short walks on flat ground if you can. Try to walk farther each day.  Do not sit, drive, or stand in one place for more than 30 minutes. Do not stay in bed.  Do not avoid exercise or work. Activity can help your back heal faster.  Be careful when you bend or lift an object. Bend at your knees, keep the object close to you, and do not twist.  Sleep on a firm mattress. Lie on your side, and bend your knees. If you lie on your back, put a pillow under your knees.  Only take medicines as told by your doctor.  Put ice on the injured area.  Put ice in a plastic bag.  Place a towel between your skin and the bag.  Leave the ice on for 15 to 20 minutes, 3 to 4 times a day for the first 2 to 3 days. After that, you can switch between ice and heat packs.  Ask your doctor about back exercises or massage.  Avoid feeling anxious or stressed. Find good ways to deal with stress, such as exercise. GET HELP RIGHT AWAY IF:   Your pain does not go away with rest or medicine.  Your pain does not go away in 1 week.  You have new problems.  You do not feel well.  The pain spreads into your legs.  You cannot control when you poop (bowel movement) or pee (urinate).  Your arms or legs feel weak or lose feeling (numbness).  You feel sick to your stomach (nauseous) or throw up (vomit).  You have belly (abdominal) pain.  You feel like you may pass out  (faint). MAKE SURE YOU:   Understand these instructions.  Will watch your condition.  Will get help right away if you are not doing well or get worse. Document Released: 07/16/2007 Document Revised: 04/21/2011 Document Reviewed: 06/17/2010 [facility] Patient Information 2013 [place], [place].

## 2012-01-13 NOTE — Assessment & Plan Note (Signed)
Patient with back pain likely due to muscle strain/spasm. Red flags were negative. Plan: given prescription for flexeril 5 mg TID prn for pain. Advised to take ibuprofen 800 mg every 6 hours for pain and use heating pad as desired. Also, referral placed for PT.

## 2012-01-19 ENCOUNTER — Telehealth: Payer: Self-pay | Admitting: Family Medicine

## 2012-01-19 NOTE — Telephone Encounter (Signed)
Patient was in early last week for muscle spasms, and she ended up being out of work through Friday and would like a note for 12/3 - 12/6, faxed to her at [phone]

## 2012-01-19 NOTE — Telephone Encounter (Signed)
Will route note to Dr. Birdie Sons for approval and call patient back tomorrow.  Gaylene Brooks, RN

## 2012-01-21 ENCOUNTER — Encounter (HOSPITAL_COMMUNITY): Payer: Self-pay | Admitting: Family Medicine

## 2012-01-21 NOTE — Telephone Encounter (Signed)
It is ok for patient to get a note for work excusing her from 01/13/12 to 01/16/12. Please fax this to her at [phone].

## 2012-01-21 NOTE — Telephone Encounter (Signed)
Letter created and LMOVM for pt that we were faxing now. Meghan [name], Maryjo [place]

## 2012-01-23 ENCOUNTER — Ambulatory Visit: Payer: BC Managed Care – PPO | Admitting: Family Medicine

## 2012-03-15 ENCOUNTER — Other Ambulatory Visit (HOSPITAL_COMMUNITY)
Admission: RE | Admit: 2012-03-15 | Discharge: 2012-03-15 | Disposition: A | Discharge status: Home / Self Care | Payer: BC Managed Care – PPO | Source: Ambulatory Visit | Attending: Family Medicine | Admitting: Family Medicine

## 2012-03-15 ENCOUNTER — Ambulatory Visit (INDEPENDENT_AMBULATORY_CARE_PROVIDER_SITE_OTHER): Payer: BC Managed Care – PPO | Admitting: Family Medicine

## 2012-03-15 ENCOUNTER — Encounter: Payer: Self-pay | Admitting: Family Medicine

## 2012-03-15 VITALS — BP 127/82 | HR 72 | Temp 98.1°F | Ht 66.5 in | Wt 183.0 lb

## 2012-03-15 DIAGNOSIS — G44209 Tension-type headache, unspecified, not intractable: Secondary | ICD-10-CM

## 2012-03-15 DIAGNOSIS — R109 Unspecified abdominal pain: Secondary | ICD-10-CM

## 2012-03-15 DIAGNOSIS — Z113 Encounter for screening for infections with a predominantly sexual mode of transmission: Secondary | ICD-10-CM | POA: Insufficient documentation

## 2012-03-15 DIAGNOSIS — M549 Dorsalgia, unspecified: Secondary | ICD-10-CM

## 2012-03-15 LAB — POCT URINALYSIS DIPSTICK
Blood, UA: NEGATIVE
Ketones, UA: NEGATIVE
Protein, UA: NEGATIVE
Spec Grav, UA: 1.015
Urobilinogen, UA: 1
pH, UA: 6

## 2012-03-15 LAB — POCT WET PREP (WET MOUNT): Clue Cells Wet Prep Whiff POC: NEGATIVE

## 2012-03-15 NOTE — Patient Instructions (Addendum)
Meghan [name],  Thank you for coming in today.  Your wet prep is: negative  Your urine is: negative  The results of your gonorrhea and chlamydia test will be available on mychart in two days. I will call with results as well.  For abdominal and flank pain, I recommend tylenol 1000 mg up to three times daily. Keep a log of symptoms and any associated triggers.   Please follow up with your PCP.  Call and come back for worsening symptoms or new onset fever or vomiting.   Dr. Armen Pickup

## 2012-03-15 NOTE — Assessment & Plan Note (Addendum)
A: midline abdominal pain. Reassuring exam. UA and wet prep negative.  Possible gastritis vs MSK pain.  P: Reassurance  tylenol for pain control  Close f/u prn symptoms: worsening pain, new vomiting, fever.  Will call with results of GC/chlam

## 2012-03-15 NOTE — Progress Notes (Signed)
Subjective:     Patient ID: Meghan [name], female   DOB: [birth year], 52 y.o.   MRN: [id number]  HPI 52 yo F presents for same day visit to discuss the following:  1. Midline abdominal pain: x 4 days. Nagging pain that is intermittently sharp. She denies fever, chills, nausea, vomiting, diarrhea and constipation. She denies worsening symptoms with food and reflux. She has not had pain like this before. She tried motrin for pain with temporary relief.   2. Bilateral flank pain: 3 days. Associated with one day of dysuria.   3. Request STD test: sexually active with one partner. BTL for contraception. denies vaginal discharge, itching, irritation or lesions.   Review of Systems    Objective:   Physical Exam BP 127/82  Pulse 72  Temp 98.1 F (36.7 C) (Oral)  Ht 5' 6.5" (1.689 m)  Wt 183 lb (83.008 kg)  BMI 29.09 kg/m2 General appearance: alert, cooperative and no distress Throat: lips, mucosa, and tongue normal; teeth and gums normal Back: symmetric, no curvature. ROM normal. Mild CVA tenderness. Abdomen: Flat, mildly obese, NABS, no masses, mild periumbilical and suprapubic TTP w/o rebound or guarding. Negative McMurphy's sign.  Pelvic: cervix normal in appearance, external genitalia normal, no adnexal masses or tenderness, no cervical motion tenderness, rectovaginal septum normal, uterus normal size, shape, and consistency and scant white vaginal discharge.      Assessment and Plan:

## 2012-03-15 NOTE — Assessment & Plan Note (Signed)
A: bilateral flank pain w/o evidence of UTI or stone. Suspect MSK pain. P: Tylenol Encouraged patient to continue exercise F/u prn.

## 2012-03-17 ENCOUNTER — Telehealth: Payer: Self-pay | Admitting: Family Medicine

## 2012-03-17 NOTE — Telephone Encounter (Signed)
Negative gonorrhea and chlamydia. Patient informed.

## 2012-08-02 ENCOUNTER — Ambulatory Visit: Payer: BC Managed Care – PPO

## 2012-08-10 ENCOUNTER — Telehealth: Payer: Self-pay | Admitting: *Deleted

## 2012-08-10 NOTE — Telephone Encounter (Signed)
Pt reports that she has larnygitis/dry cough for about a week and believes it is allergy related and would like recommendations for OTC meds - unable to come in do to work obligations. Recommended MUcinex, Delysum and increase in fluids. If no better by Monday then to call for an appointment. Pt verbalized understanding. Wyatt Haste, RN-BSN

## 2012-08-11 ENCOUNTER — Ambulatory Visit: Payer: BC Managed Care – PPO | Admitting: Sports Medicine

## 2012-08-12 ENCOUNTER — Ambulatory Visit (INDEPENDENT_AMBULATORY_CARE_PROVIDER_SITE_OTHER): Payer: BC Managed Care – PPO | Admitting: Family Medicine

## 2012-08-12 ENCOUNTER — Encounter: Payer: Self-pay | Admitting: Family Medicine

## 2012-08-12 VITALS — BP 113/75 | HR 62 | Temp 98.1°F | Wt 184.0 lb

## 2012-08-12 DIAGNOSIS — J329 Chronic sinusitis, unspecified: Secondary | ICD-10-CM

## 2012-08-12 DIAGNOSIS — R21 Rash and other nonspecific skin eruption: Secondary | ICD-10-CM | POA: Insufficient documentation

## 2012-08-12 HISTORY — DX: Chronic sinusitis, unspecified: J32.9

## 2012-08-12 MED ORDER — BENZONATATE 100 MG PO CAPS: 100.0000 mg | ORAL_CAPSULE | Freq: Two times a day (BID) | ORAL | Status: DC | PRN

## 2012-08-12 MED ORDER — KETOCONAZOLE 2 % EX CREA: TOPICAL_CREAM | Freq: Every day | CUTANEOUS | Status: DC

## 2012-08-12 MED ORDER — AMOXICILLIN-POT CLAVULANATE 875-125 MG PO TABS: 1.0000 | ORAL_TABLET | Freq: Two times a day (BID) | ORAL | Status: DC

## 2012-08-12 NOTE — Progress Notes (Signed)
  Subjective:    Patient ID: Meghan [name], female    DOB: 28-Dec-1960, 52 y.o.   MRN: [id number]  Rash   patient is a 52 yo female who presents for evaluation of cough and congestion x2 weeks.  Started 2 weeks ago. Cough, sore throat, congestion, itchy and scratchy sore throat, and drainage. Denies fever. Took "airborn" with minimal relief. Tried delsum and mucinex and had increased drainage, clear. Takes zyrtec for allergies.   Patient also with rash over midline of neck. Dry skin. Itching. Thinks is fungal.  Review of Systems  Skin: Positive for rash.   see HPI     Objective:   Physical Exam  Constitutional: She appears well-developed and well-nourished.  HENT:  Head: Normocephalic and atraumatic.  Nose: Nose normal.  Mouth/Throat: No oropharyngeal exudate (mild erythema).  Bilateral TM nml, maxillary sinus TTP  Eyes: Conjunctivae are normal. Pupils are equal, round, and reactive to light. Right eye exhibits no discharge. Left eye exhibits no discharge.  Cardiovascular: Normal rate, regular rhythm and normal heart sounds.   Pulmonary/Chest: Effort normal and breath sounds normal.  Skin:  Anterior midline neck with dry, scaly rash, minimal erythema, potential satellite lesions  BP 113/75  Pulse 62  Temp(Src) 98.1 F (36.7 C) (Oral)  Wt 184 lb (83.462 kg)  BMI 29.26 kg/m2    Assessment & Plan:

## 2012-08-12 NOTE — Patient Instructions (Addendum)
Nice to meet you. Please continue to use the mucinex. Try using the tessalon for your cough. Take the augmentin (antibiotic) for the next 7 days. Also for the rash try using the ketaconazole cream once a day for the next 2 weeks. You can additionally use benadryl if you are itching.

## 2012-08-12 NOTE — Assessment & Plan Note (Addendum)
Over anterior midline neck. Likely an atopic reaction, though potentially a tinea infection. Plan: advised to use moisturizer as needed. Will treat with ketoconazole daily x 2 weeks for potential tinea infection.

## 2012-08-12 NOTE — Assessment & Plan Note (Addendum)
Patient with symptoms >10 days. Likely started as viral infection, potentially bacterial sinusitis given no improvement in symptoms. Plan: augmentin x7 days. Given pt allergies to narcotics, will use tessalon for cough. To continue mucinex.

## 2012-08-20 ENCOUNTER — Other Ambulatory Visit: Payer: Self-pay

## 2012-08-20 DIAGNOSIS — Z1231 Encounter for screening mammogram for malignant neoplasm of breast: Secondary | ICD-10-CM

## 2012-09-07 ENCOUNTER — Ambulatory Visit
Admission: RE | Admit: 2012-09-07 | Discharge: 2012-09-07 | Disposition: A | Discharge status: Home / Self Care | Payer: BC Managed Care – PPO | Source: Ambulatory Visit

## 2012-09-07 DIAGNOSIS — Z1231 Encounter for screening mammogram for malignant neoplasm of breast: Secondary | ICD-10-CM

## 2012-09-08 ENCOUNTER — Other Ambulatory Visit: Payer: Self-pay | Admitting: Family Medicine

## 2012-09-08 ENCOUNTER — Encounter: Payer: Self-pay | Admitting: Family Medicine

## 2012-09-08 DIAGNOSIS — R928 Other abnormal and inconclusive findings on diagnostic imaging of breast: Secondary | ICD-10-CM

## 2012-09-08 NOTE — Progress Notes (Signed)
Patient ID: Meghan [name], female   DOB: 1960-11-30, 51 y.o.   MRN: [id number] Outside labs drawn 7/29 CBC, Hgb slightly low at 11.7, normal indices BMP K+=3.1 LFTs normal TSH nl at 0.648

## 2012-09-10 ENCOUNTER — Encounter: Payer: Self-pay | Admitting: Family Medicine

## 2012-09-10 ENCOUNTER — Telehealth: Payer: Self-pay | Admitting: *Deleted

## 2012-09-10 ENCOUNTER — Ambulatory Visit (INDEPENDENT_AMBULATORY_CARE_PROVIDER_SITE_OTHER): Payer: BC Managed Care – PPO | Admitting: Family Medicine

## 2012-09-10 VITALS — BP 120/70 | HR 79 | Temp 98.4°F | Ht 66.5 in | Wt 183.0 lb

## 2012-09-10 DIAGNOSIS — I10 Essential (primary) hypertension: Secondary | ICD-10-CM

## 2012-09-10 DIAGNOSIS — R490 Dysphonia: Secondary | ICD-10-CM

## 2012-09-10 MED ORDER — OMEPRAZOLE 20 MG PO CPDR: 20.0000 mg | DELAYED_RELEASE_CAPSULE | Freq: Every day | ORAL | Status: DC

## 2012-09-10 NOTE — Assessment & Plan Note (Signed)
High potassium diet.  Hold Metoprolol. Follow BP closely

## 2012-09-10 NOTE — Assessment & Plan Note (Signed)
Present x 2 months.  Quit smoking 20 years ago so low risk cancer.  Still will do ENT referral.  Also try daily omeprozole.

## 2012-09-10 NOTE — Telephone Encounter (Signed)
Spoke with patient and informed her of the ENT appointment set up for Wednesday 09/15/2012 @ 3:30pm with Dr. Jenne Pane. [facility] [address] [phone]

## 2012-09-10 NOTE — Patient Instructions (Addendum)
OK to hold Metoprolol now and follow BP carefully.  Restart and notify me if BP is high.  We are looking for 140/90 or less on readings. Take over the counter omeprazole daily to decrease stomach acid.  We will see if that is contributing to your hoarseness. Remember the high potassium diet for your BP and because the blood work showed your potassium was slightly low.  I should recheck it in 6-8 weeks to make sure the high potassium diet is working. My nurse will set you up for an ENT referral.

## 2012-09-10 NOTE — Progress Notes (Signed)
  Subjective:    Patient ID: Meghan [name], female    DOB: 31-May-1960, 52 y.o.   MRN: [id number]  HPI  Hoarse for 2 months.  Began as typical viral laryngitis with cough.  Cough and illness have cleared but hoarseness remains.  Does not abuse voice.  Ex smoker but quit 20 years ago.  No reflux symptoms.  Also had recent blood work.  Noted to be low on potassium.  Would like to try high potassium diet rather than meds  C/O mild ankle swelling.  Recent creat normal.  Wants to decrease BP meds.  Working hard on diet, low sodium and lifestyle.    Review of Systems     Objective:   Physical Exam TMs normal Throat normal Neck, Supple no masses or nodes. Lungs clear. Cardiac RRR without m or gallop Ext symetric trace edema.      Assessment & Plan:

## 2012-09-22 ENCOUNTER — Other Ambulatory Visit: Payer: Self-pay | Admitting: Family Medicine

## 2012-09-23 ENCOUNTER — Ambulatory Visit
Admission: RE | Admit: 2012-09-23 | Discharge: 2012-09-23 | Disposition: A | Discharge status: Home / Self Care | Payer: BC Managed Care – PPO | Source: Ambulatory Visit | Attending: Family Medicine | Admitting: Family Medicine

## 2012-09-23 DIAGNOSIS — R928 Other abnormal and inconclusive findings on diagnostic imaging of breast: Secondary | ICD-10-CM

## 2013-02-17 ENCOUNTER — Telehealth: Payer: Self-pay | Admitting: Family Medicine

## 2013-02-17 MED ORDER — NYSTATIN 100000 UNIT/ML MT SUSP: 5.0000 mL | Freq: Four times a day (QID) | OROMUCOSAL | Status: DC

## 2013-02-17 MED ORDER — CEPHALEXIN 500 MG PO CAPS: 500.0000 mg | ORAL_CAPSULE | Freq: Three times a day (TID) | ORAL | Status: DC

## 2013-02-17 NOTE — Telephone Encounter (Signed)
I was able to talk with her.  She is concern about thrush/yeast infection, not urinary track infection.  Told to just pick up the nystatin and not the Keflex at the pharmacy.

## 2013-02-17 NOTE — Telephone Encounter (Signed)
Pt called and said that she has a UTI and Thrust and would like medication for both called in to her pharmacy. If you call her you can leave a message on her cell phone since she is at work. jw

## 2013-03-07 ENCOUNTER — Other Ambulatory Visit: Payer: Self-pay | Admitting: Family Medicine

## 2013-03-07 DIAGNOSIS — N6320 Unspecified lump in the left breast, unspecified quadrant: Secondary | ICD-10-CM

## 2013-03-29 ENCOUNTER — Inpatient Hospital Stay: Admission: RE | Admit: 2013-03-29 | Payer: BC Managed Care – PPO | Source: Ambulatory Visit

## 2013-04-19 ENCOUNTER — Other Ambulatory Visit: Payer: BC Managed Care – PPO

## 2013-05-18 ENCOUNTER — Ambulatory Visit
Admission: RE | Admit: 2013-05-18 | Discharge: 2013-05-18 | Disposition: A | Discharge status: Home / Self Care | Payer: BC Managed Care – PPO | Source: Ambulatory Visit | Attending: Family Medicine | Admitting: Family Medicine

## 2013-05-18 ENCOUNTER — Other Ambulatory Visit: Payer: Self-pay | Admitting: Family Medicine

## 2013-05-18 DIAGNOSIS — N6310 Unspecified lump in the right breast, unspecified quadrant: Secondary | ICD-10-CM

## 2013-05-18 DIAGNOSIS — N6320 Unspecified lump in the left breast, unspecified quadrant: Secondary | ICD-10-CM

## 2013-09-08 ENCOUNTER — Encounter: Payer: Self-pay | Admitting: Family Medicine

## 2013-09-27 ENCOUNTER — Telehealth: Payer: Self-pay | Admitting: Family Medicine

## 2013-09-27 NOTE — Telephone Encounter (Signed)
Spoke with patient and she informed me that she was trying a natural remedy that has worked. The spreading she believes has come from her scratching at the area. She can only make an appointment from 12pm-1pm. We do not have any available at that time. If she believes this is getting worse she will call us back.

## 2013-09-27 NOTE — Telephone Encounter (Signed)
Pt called because she went to a wedding and was given a necklace that all the bridesmaids wore. She notice a that it left a dark mark around her neck. Now this dark mark has spread to her face, neck and under her breast. She wanted to know if something can be called in. I told her it would be better for her to come in so that a doctor can look at it, but she going out of town this morning.She would like someone to call her at [phone] to discuss. jw

## 2013-11-28 ENCOUNTER — Other Ambulatory Visit: Payer: Self-pay | Admitting: Family Medicine

## 2013-11-28 DIAGNOSIS — N6002 Solitary cyst of left breast: Secondary | ICD-10-CM

## 2013-12-07 ENCOUNTER — Inpatient Hospital Stay: Admission: RE | Admit: 2013-12-07 | Payer: BC Managed Care – PPO | Source: Ambulatory Visit

## 2013-12-07 ENCOUNTER — Ambulatory Visit
Admission: RE | Admit: 2013-12-07 | Discharge: 2013-12-07 | Disposition: A | Discharge status: Home / Self Care | Payer: BC Managed Care – PPO | Source: Ambulatory Visit | Attending: Family Medicine | Admitting: Family Medicine

## 2013-12-07 DIAGNOSIS — N6002 Solitary cyst of left breast: Secondary | ICD-10-CM

## 2013-12-08 ENCOUNTER — Other Ambulatory Visit: Payer: BC Managed Care – PPO

## 2014-07-04 ENCOUNTER — Other Ambulatory Visit: Payer: Self-pay | Admitting: Family Medicine

## 2014-07-04 DIAGNOSIS — N63 Unspecified lump in unspecified breast: Secondary | ICD-10-CM

## 2014-07-11 ENCOUNTER — Other Ambulatory Visit: Payer: BC Managed Care – PPO

## 2014-07-13 ENCOUNTER — Other Ambulatory Visit: Payer: BC Managed Care – PPO

## 2014-07-17 ENCOUNTER — Ambulatory Visit
Admission: RE | Admit: 2014-07-17 | Discharge: 2014-07-17 | Disposition: A | Discharge status: Home / Self Care | Payer: BC Managed Care – PPO | Source: Ambulatory Visit | Attending: Family Medicine | Admitting: Family Medicine

## 2014-07-17 DIAGNOSIS — N63 Unspecified lump in unspecified breast: Secondary | ICD-10-CM

## 2014-07-21 ENCOUNTER — Ambulatory Visit (INDEPENDENT_AMBULATORY_CARE_PROVIDER_SITE_OTHER): Payer: BC Managed Care – PPO | Admitting: Family Medicine

## 2014-07-21 ENCOUNTER — Encounter: Payer: Self-pay | Admitting: Family Medicine

## 2014-07-21 ENCOUNTER — Telehealth: Payer: Self-pay | Admitting: Family Medicine

## 2014-07-21 VITALS — BP 144/83 | HR 59 | Temp 98.3°F | Ht 66.5 in | Wt 167.4 lb

## 2014-07-21 DIAGNOSIS — F322 Major depressive disorder, single episode, severe without psychotic features: Secondary | ICD-10-CM | POA: Insufficient documentation

## 2014-07-21 MED ORDER — CITALOPRAM HYDROBROMIDE 20 MG PO TABS: 20.0000 mg | ORAL_TABLET | Freq: Every day | ORAL | Status: DC

## 2014-07-21 NOTE — Patient Instructions (Signed)
I am sorry for the situation.  I will try to help Please take time to take care of yourself.  Exercise and time to recoup are important. Let's try a low dose antidepressant even though that did not help much in the past. See me in one month. I will have your paperwork complete by Monday after 3PM

## 2014-07-21 NOTE — Assessment & Plan Note (Signed)
Start SSRI, low dose.  She was previously on antidepressant and it was "too strong for me, I slept all the time."  Doubt this will be a problem with celexa.  FU 1 month.

## 2014-07-21 NOTE — Telephone Encounter (Signed)
Placed in MDs box. Fleeger, Jessica Dawn  

## 2014-07-21 NOTE — Progress Notes (Signed)
   Subjective:    Patient ID: Meghan [name], female    DOB: 1960/08/26, 54 y.o.   MRN: [id number]  HPI Depression with all the classic symptoms including insomnia, anhedonia, anger, irritability, tearfulness.  She recently ended a long term relationship.  She is caring for her elderly mother who is a diabetic with ESRD on dialysis.  She has had passing suicidal thoughts but has no plan.  Feels totally overwhelmed.  She has had two prior bouts of depression: first with her divorce in 1989 and second 6 years ago when her daughter had an unintended preg in her first year of college. No HI.  Does contract for safety.  She has been out on FMLA to care for her mother.  Feels incapable of doing her job and asks my help in completing her short term disability paperwork.      Review of Systems     Objective:   Physical Exam Tearful in the office. Color good - no suggestion of anemia No thyromegally        Assessment & Plan:

## 2014-07-21 NOTE — Telephone Encounter (Signed)
Patient drop Meghan [name] Short-Term Disability Form to be completed and signed by PCP. Patient needs to pick the form up on Monday. Please, follow up with Patient.

## 2014-07-24 NOTE — Telephone Encounter (Signed)
Form completed and placed up front.

## 2015-02-10 ENCOUNTER — Telehealth: Payer: Self-pay | Admitting: Family Medicine

## 2015-02-10 DIAGNOSIS — J45909 Unspecified asthma, uncomplicated: Secondary | ICD-10-CM

## 2015-02-10 MED ORDER — ALBUTEROL SULFATE HFA 108 (90 BASE) MCG/ACT IN AERS: 2.0000 | INHALATION_SPRAY | Freq: Four times a day (QID) | RESPIRATORY_TRACT | Status: DC | PRN

## 2015-02-10 NOTE — Telephone Encounter (Signed)
[place] After Hours Telephone Line  Person calling: Meghan [name] Relationship to patient: Self  Chief complaint: Medication refill  Patient is an asthmatic and does not have her inhaler. She has been traveling and is currently in the [place] area. Discussed with her that the after hours line is for emergencies and does not include medication refills, however, since this medication is a rescue device (and she is coughing), will send a prescription to the pharmacy she is requesting. Patient verbalized understanding of policy and will pick up albuterol refill.  Meds ordered this encounter  Medications  . albuterol (PROVENTIL HFA;VENTOLIN HFA) 108 (90 Base) MCG/ACT inhaler    Sig: Inhale 2 puffs into the lungs every 6 (six) hours as needed for wheezing or shortness of breath.    Dispense:  1 Inhaler    Refill:  0   Cordelia Poche, MD PGY-3, [facility] 02/10/2015, 5:51 PM

## 2015-04-30 ENCOUNTER — Telehealth: Payer: Self-pay | Admitting: Family Medicine

## 2015-04-30 NOTE — Telephone Encounter (Signed)
Called pt at number [phone]. Did not leave a message since it was a business line. Need to inform them that if needed she can take granddaughter to Urgent Care. Ottis Stain, [place]

## 2015-04-30 NOTE — Telephone Encounter (Signed)
Pt called because her granddaughter is visiting from [place]. She has an earache and she needed to be seen or have some medication called in. She is not a patient here, I explained that I could bring her in as a new patient but that would not be today and maybe not this week since she is not established. Please call her to discuss. jw

## 2015-07-17 ENCOUNTER — Telehealth: Payer: Self-pay | Admitting: *Deleted

## 2015-07-17 MED ORDER — FLUCONAZOLE 150 MG PO TABS: 150.0000 mg | ORAL_TABLET | Freq: Once | ORAL | Status: DC

## 2015-07-17 NOTE — Telephone Encounter (Signed)
Called and discussed.  Will do one time diflucan.  Also informed that she is due for a PAP.  Should make an appointment soon.

## 2015-07-17 NOTE — Telephone Encounter (Signed)
Patient thinks that she may have a yeast infection and would like to know if she can get something called in or you would like for her to be seen.  Patient would like to hear from provider before making an appt. Meghan [name],CMA

## 2015-10-03 ENCOUNTER — Ambulatory Visit (INDEPENDENT_AMBULATORY_CARE_PROVIDER_SITE_OTHER): Payer: Self-pay | Admitting: Family Medicine

## 2015-10-03 ENCOUNTER — Encounter: Payer: Self-pay | Admitting: Family Medicine

## 2015-10-03 ENCOUNTER — Other Ambulatory Visit (HOSPITAL_COMMUNITY)
Admission: RE | Admit: 2015-10-03 | Discharge: 2015-10-03 | Disposition: A | Discharge status: Home / Self Care | Payer: BC Managed Care – PPO | Source: Ambulatory Visit | Attending: Family Medicine | Admitting: Family Medicine

## 2015-10-03 ENCOUNTER — Telehealth: Payer: Self-pay | Admitting: *Deleted

## 2015-10-03 VITALS — BP 144/78 | HR 64 | Temp 98.3°F | Ht 66.5 in | Wt 165.2 lb

## 2015-10-03 DIAGNOSIS — N950 Postmenopausal bleeding: Secondary | ICD-10-CM | POA: Insufficient documentation

## 2015-10-03 DIAGNOSIS — A499 Bacterial infection, unspecified: Secondary | ICD-10-CM

## 2015-10-03 DIAGNOSIS — Z1159 Encounter for screening for other viral diseases: Secondary | ICD-10-CM | POA: Insufficient documentation

## 2015-10-03 DIAGNOSIS — B9689 Other specified bacterial agents as the cause of diseases classified elsewhere: Secondary | ICD-10-CM

## 2015-10-03 DIAGNOSIS — Z01419 Encounter for gynecological examination (general) (routine) without abnormal findings: Secondary | ICD-10-CM | POA: Insufficient documentation

## 2015-10-03 DIAGNOSIS — N760 Acute vaginitis: Secondary | ICD-10-CM

## 2015-10-03 DIAGNOSIS — Z0000 Encounter for general adult medical examination without abnormal findings: Secondary | ICD-10-CM

## 2015-10-03 DIAGNOSIS — Z1239 Encounter for other screening for malignant neoplasm of breast: Secondary | ICD-10-CM

## 2015-10-03 LAB — POCT WET PREP (WET MOUNT)
Clue Cells Wet Prep Whiff POC: POSITIVE
TRICHOMONAS WET PREP HPF POC: ABSENT

## 2015-10-03 NOTE — Telephone Encounter (Signed)
Tried to contact pt to see what time is good to schedule her [place].  Did not leave message due to message sounding like it was a business.  If pt calls back please see what time works best for her and we will get this scheduled.Katharina Caper, [name], [place]'

## 2015-10-03 NOTE — Patient Instructions (Addendum)
Get your mammogram I will call tomorrow with test results. The nurse will arrange a pelvic ultrasound for you.  I am a bit worried about this spotting. Of course, I will also call you after the ultrasound.

## 2015-10-04 DIAGNOSIS — B9689 Other specified bacterial agents as the cause of diseases classified elsewhere: Secondary | ICD-10-CM | POA: Insufficient documentation

## 2015-10-04 DIAGNOSIS — N760 Acute vaginitis: Secondary | ICD-10-CM

## 2015-10-04 MED ORDER — CLINDAMYCIN PHOSPHATE 2 % VA CREA: 1.0000 | TOPICAL_CREAM | Freq: Every day | VAGINAL | 0 refills | Status: DC

## 2015-10-04 MED ORDER — METRONIDAZOLE 500 MG PO TABS: 500.0000 mg | ORAL_TABLET | Freq: Two times a day (BID) | ORAL | 0 refills | Status: DC

## 2015-10-04 NOTE — Progress Notes (Signed)
   Subjective:    Patient ID: Meghan [name], female    DOB: 02-23-1960, 55 y.o.   MRN: [id number]  HPI Vag itching +++ C/O vag itching.  Not sexually active.  Very low risk for STD.  No DC noted.  No urgency, frequency or dysura Rt breast lump - painful.  Normal mammo about 1 year ago.   Due for pap.  No hx of abnormal pap. Never had hep c screen. She has had some scant vaginal spotting, perhaps on a monthly basis.  She quit having periods after a procedure (endometrial ablation? Done elsewhere.)  Now spotting.     Review of Systems     Objective:   Physical Exam Breasts  Right lower, medial quadrent.  Thicking at the inferior bra line.  No true mass.  Pain seems more over costo sternal joint than in breast.  Lt breast OK but has a mildly tender costo sternal joint on left  Abd benign Pelvic, Pap done, does have intravag discharge, wet prep.  Bimanual OK No cervical motion tenderness.        Assessment & Plan:

## 2015-10-04 NOTE — Assessment & Plan Note (Signed)
If she did have endometrial ablation, endometrial biopsy will be difficult.  Will start with vag probe ultrasound to measure endometrial stripe.

## 2015-10-04 NOTE — Assessment & Plan Note (Signed)
No true mass.  Encouraged to get mammogram which is due.

## 2015-10-04 NOTE — Telephone Encounter (Signed)
Called to give pt her appointment for her [place] and it was scheduled for 10/04/2015 at 3pm with a 2:45pm arrival.  She is to have a full bladder and to have had 32 oz of water prior, when I gave her the time she said that she had another appointment for that day and I gave her the scheduling number to call and reschedule. Katharina Caper, Enya Bureau D, [place]

## 2015-10-04 NOTE — Assessment & Plan Note (Signed)
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  Rx with clinda gel.

## 2015-10-05 LAB — CYTOLOGY - PAP

## 2015-10-09 ENCOUNTER — Ambulatory Visit (HOSPITAL_COMMUNITY): Payer: BC Managed Care – PPO

## 2015-10-09 ENCOUNTER — Other Ambulatory Visit: Payer: Self-pay | Admitting: Family Medicine

## 2015-10-09 ENCOUNTER — Ambulatory Visit (HOSPITAL_COMMUNITY)
Admission: RE | Admit: 2015-10-09 | Discharge: 2015-10-09 | Disposition: A | Discharge status: Home / Self Care | Payer: Self-pay | Source: Ambulatory Visit | Attending: Family Medicine | Admitting: Family Medicine

## 2015-10-09 DIAGNOSIS — D259 Leiomyoma of uterus, unspecified: Secondary | ICD-10-CM | POA: Insufficient documentation

## 2015-10-09 DIAGNOSIS — N950 Postmenopausal bleeding: Secondary | ICD-10-CM | POA: Insufficient documentation

## 2015-11-07 ENCOUNTER — Ambulatory Visit: Payer: BC Managed Care – PPO

## 2015-11-19 ENCOUNTER — Ambulatory Visit (INDEPENDENT_AMBULATORY_CARE_PROVIDER_SITE_OTHER): Payer: Self-pay | Admitting: *Deleted

## 2015-11-19 DIAGNOSIS — Z111 Encounter for screening for respiratory tuberculosis: Secondary | ICD-10-CM

## 2015-11-19 NOTE — Progress Notes (Signed)
Tuberculin skin test applied to left ventral forearm. Explained how to read the test, measuring induration not just erythema; she will come into in 48-72 hours to have test read.

## 2015-11-21 ENCOUNTER — Ambulatory Visit (INDEPENDENT_AMBULATORY_CARE_PROVIDER_SITE_OTHER): Payer: Self-pay | Admitting: *Deleted

## 2015-11-21 DIAGNOSIS — Z111 Encounter for screening for respiratory tuberculosis: Secondary | ICD-10-CM

## 2015-11-21 NOTE — Progress Notes (Signed)
   Patient in clinic for PPD reading. Patient is to early to have PPD read.  Patient rescheduled for tomorrow at 9:15 AM.  Derl Barrow, RN

## 2015-11-22 ENCOUNTER — Encounter: Payer: Self-pay | Admitting: *Deleted

## 2015-11-22 ENCOUNTER — Ambulatory Visit (INDEPENDENT_AMBULATORY_CARE_PROVIDER_SITE_OTHER): Payer: Self-pay | Admitting: *Deleted

## 2015-11-22 DIAGNOSIS — Z111 Encounter for screening for respiratory tuberculosis: Secondary | ICD-10-CM

## 2015-11-22 LAB — TB SKIN TEST
Induration: 0 mm
TB Skin Test: NEGATIVE

## 2015-11-22 NOTE — Progress Notes (Signed)
   PPD Reading Note PPD read and results entered in EpicCare. Result: 0 mm induration. Interpretation: Negative If test not read within 48-72 hours of initial placement, patient advised to repeat in other arm 1-3 weeks after this test. Allergic reaction: no  Martin, Tamika L, RN  

## 2016-01-09 ENCOUNTER — Ambulatory Visit (INDEPENDENT_AMBULATORY_CARE_PROVIDER_SITE_OTHER): Payer: Self-pay | Admitting: Family Medicine

## 2016-01-09 ENCOUNTER — Encounter: Payer: Self-pay | Admitting: Family Medicine

## 2016-01-09 DIAGNOSIS — I10 Essential (primary) hypertension: Secondary | ICD-10-CM

## 2016-01-09 DIAGNOSIS — Z1159 Encounter for screening for other viral diseases: Secondary | ICD-10-CM

## 2016-01-09 DIAGNOSIS — R946 Abnormal results of thyroid function studies: Secondary | ICD-10-CM

## 2016-01-09 DIAGNOSIS — J209 Acute bronchitis, unspecified: Secondary | ICD-10-CM | POA: Insufficient documentation

## 2016-01-09 DIAGNOSIS — D531 Other megaloblastic anemias, not elsewhere classified: Secondary | ICD-10-CM

## 2016-01-09 DIAGNOSIS — J4521 Mild intermittent asthma with (acute) exacerbation: Secondary | ICD-10-CM

## 2016-01-09 DIAGNOSIS — K219 Gastro-esophageal reflux disease without esophagitis: Secondary | ICD-10-CM

## 2016-01-09 DIAGNOSIS — J208 Acute bronchitis due to other specified organisms: Secondary | ICD-10-CM

## 2016-01-09 LAB — COMPLETE METABOLIC PANEL WITH GFR
ALT: 11 U/L (ref 6–29)
AST: 21 U/L (ref 10–35)
Albumin: 4 g/dL (ref 3.6–5.1)
Alkaline Phosphatase: 50 U/L (ref 33–130)
BILIRUBIN TOTAL: 1.4 mg/dL — AB (ref 0.2–1.2)
BUN: 6 mg/dL — ABNORMAL LOW (ref 7–25)
CO2: 29 mmol/L (ref 20–31)
CREATININE: 0.63 mg/dL (ref 0.50–1.05)
Calcium: 9.6 mg/dL (ref 8.6–10.4)
Chloride: 107 mmol/L (ref 98–110)
GFR, Est African American: >89 mL/min (ref >=60)
GFR, Est Non African American: >89 mL/min (ref >=60)
GLUCOSE: 90 mg/dL (ref 65–99)
Potassium: 3.8 mmol/L (ref 3.5–5.3)
SODIUM: 142 mmol/L (ref 135–146)
TOTAL PROTEIN: 6.4 g/dL (ref 6.1–8.1)

## 2016-01-09 LAB — TSH: TSH: 0.69 m[IU]/L

## 2016-01-09 LAB — CBC
HCT: 25.3 % — ABNORMAL LOW (ref 35.0–45.0)
Hemoglobin: 8.5 g/dL — ABNORMAL LOW (ref 11.7–15.5)
MCH: 42.1 pg — AB (ref 27.0–33.0)
MCHC: 33.6 g/dL (ref 32.0–36.0)
MCV: 125.2 fL — AB (ref 80.0–100.0)
MPV: 10.1 fL (ref 7.5–12.5)
PLATELETS: 205 10*3/uL (ref 140–400)
RBC: 2.02 MIL/uL — ABNORMAL LOW (ref 3.80–5.10)
RDW: 17.6 % — AB (ref 11.0–15.0)
WBC: 2.9 10*3/uL — ABNORMAL LOW (ref 3.8–10.8)

## 2016-01-09 LAB — LIPID PANEL
Cholesterol: 109 mg/dL (ref <200)
HDL: 31 mg/dL — ABNORMAL LOW (ref >50)
LDL Cholesterol: 63 mg/dL (ref <100)
Total CHOL/HDL Ratio: 3.5 Ratio (ref <5.0)
Triglycerides: 75 mg/dL (ref <150)
VLDL: 15 mg/dL (ref <30)

## 2016-01-09 MED ORDER — AZITHROMYCIN 500 MG PO TABS: 500.0000 mg | ORAL_TABLET | Freq: Every day | ORAL | 0 refills | Status: AC

## 2016-01-09 MED ORDER — FAMOTIDINE 40 MG PO TABS: 40.0000 mg | ORAL_TABLET | Freq: Every day | ORAL | 12 refills | Status: DC | PRN

## 2016-01-09 NOTE — Assessment & Plan Note (Signed)
No evidence of asthma flair.

## 2016-01-09 NOTE — Assessment & Plan Note (Signed)
Likely viral but after one month worth a trial of antibiotics.

## 2016-01-09 NOTE — Assessment & Plan Note (Signed)
Empiric trial of H2 blocker.

## 2016-01-09 NOTE — Assessment & Plan Note (Signed)
Needs TSH.

## 2016-01-09 NOTE — Patient Instructions (Signed)
I will call next Monday with the blood test results. This is most likely a series of viral infections. I think after one month it is worth a try of antibiotics. Use the reflux medicine regularly for a couple weeks to figure out if reflux is really a problem Get a flu shot as soon as you feel better and let me know so that we can update our records.

## 2016-01-09 NOTE — Assessment & Plan Note (Signed)
Needs screen

## 2016-01-09 NOTE — Progress Notes (Signed)
   Subjective:    Patient ID: Meghan [name], female    DOB: April 01, 1960, 55 y.o.   MRN: [id number]  HPI Cough that has waxed and waned for one month.  She thinks likely viral.  Exposed to her three grandchildren who have had multiple infections.  No fever.  Does not feel like she is wheezing - not like a typical asthma flair.   Would like to defer flu shot until feels better. Having some burning discomfort in mid chest.  Wonders about flair of her previous GERD. Hypertension.  No recent labs.  Well controled. Previous abnormal thyroid function tests.  No recent TSH Screen:  Needs hep c and cholesterol.    Review of Systems     Objective:   Physical Exam TMs normal  Throat slight injection, no exudate Neck no sig adenopathy or thyromegally.   Lungs clear, no wheeze. Abd benign        Assessment & Plan:

## 2016-01-10 LAB — HEPATITIS C ANTIBODY: HCV AB: NEGATIVE

## 2016-01-14 DIAGNOSIS — E538 Deficiency of other specified B group vitamins: Secondary | ICD-10-CM | POA: Insufficient documentation

## 2016-01-14 NOTE — Assessment & Plan Note (Signed)
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  Patient informed of results and that she needs FU lab testing. She understands and will come in this week.  Orders entered.

## 2016-01-14 NOTE — Addendum Note (Signed)
Addended by: [name] on: 01/14/2016 02:58 PM   Modules accepted: Orders

## 2016-01-21 ENCOUNTER — Other Ambulatory Visit: Payer: Self-pay

## 2016-01-21 DIAGNOSIS — E538 Deficiency of other specified B group vitamins: Secondary | ICD-10-CM

## 2016-01-21 DIAGNOSIS — D531 Other megaloblastic anemias, not elsewhere classified: Secondary | ICD-10-CM

## 2016-01-21 LAB — FOLATE: FOLATE: 12.7 ng/mL (ref >5.4)

## 2016-01-21 LAB — VITAMIN B12: VITAMIN B 12: 133 pg/mL — AB (ref 200–1100)

## 2016-01-22 MED ORDER — CYANOCOBALAMIN 1000 MCG/ML IJ SOLN
1000.0000 ug | INTRAMUSCULAR | Status: DC
  Administered 2016-01-23 – 2017-10-20 (×2): 1000 ug via INTRAMUSCULAR

## 2016-01-22 NOTE — Assessment & Plan Note (Signed)
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   Will treat with B 12 injections 1,000 micrograms weekly for four weeks then monthly thereafter. She will call tomorrow for nurse visit.

## 2016-01-22 NOTE — Addendum Note (Signed)
Addended by: [name] on: 01/22/2016 04:59 PM   Modules accepted: Orders

## 2016-01-23 ENCOUNTER — Ambulatory Visit (INDEPENDENT_AMBULATORY_CARE_PROVIDER_SITE_OTHER): Payer: Self-pay | Admitting: *Deleted

## 2016-01-23 DIAGNOSIS — D531 Other megaloblastic anemias, not elsewhere classified: Secondary | ICD-10-CM

## 2016-01-23 DIAGNOSIS — E538 Deficiency of other specified B group vitamins: Secondary | ICD-10-CM

## 2016-01-23 NOTE — Progress Notes (Signed)
Patient here today for Vit. B12 injection.  Also, was provided with patient education about B12 deficiency.  Patient tolerated injection and site remarkable.  Patient will schedule nurse visit for repeat injection in 1 week.  Burna Forts, BSN, RN-BC

## 2016-01-30 ENCOUNTER — Ambulatory Visit: Payer: Self-pay

## 2016-01-31 ENCOUNTER — Ambulatory Visit (INDEPENDENT_AMBULATORY_CARE_PROVIDER_SITE_OTHER): Payer: Self-pay | Admitting: *Deleted

## 2016-01-31 DIAGNOSIS — E538 Deficiency of other specified B group vitamins: Secondary | ICD-10-CM

## 2016-01-31 MED ORDER — CYANOCOBALAMIN 1000 MCG/ML IJ SOLN
1000.0000 ug | Freq: Once | INTRAMUSCULAR | Status: AC
  Administered 2016-01-31: 1000 ug via INTRAMUSCULAR

## 2016-01-31 NOTE — Progress Notes (Signed)
   Patient in nurse clinic for Vitamin-B 12 injection.  Patient is feeling well today.  Patient advised per Dr. Andria Frames; this would be her second injection. She does not have to return next week.  Patient can schedule an appointment for one month with nurse for next injection.  Cyanocobalamin 1000 mcg IM x 1 given left deltoid.  Derl Barrow, RN

## 2016-02-25 ENCOUNTER — Ambulatory Visit (INDEPENDENT_AMBULATORY_CARE_PROVIDER_SITE_OTHER): Payer: Self-pay | Admitting: *Deleted

## 2016-02-25 DIAGNOSIS — E538 Deficiency of other specified B group vitamins: Secondary | ICD-10-CM

## 2016-02-25 MED ORDER — CYANOCOBALAMIN 1000 MCG/ML IJ SOLN
1000.0000 ug | Freq: Once | INTRAMUSCULAR | Status: AC
  Administered 2016-02-25: 1000 ug via INTRAMUSCULAR

## 2016-02-25 NOTE — Progress Notes (Signed)
   Patient in nurse clinic for monthly Vitamin B-12 injection. Cyanocobalamin 1000 mcg/ml given left deltoid.  Patient stated she is feeling a little better.  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 Patient wanted to know if should could purchase OTC Vitamin B-12 supplement, if she needed another appointment for lab work and how long she will be taking the injection.  Will forward to PCP.  Derl Barrow, RN

## 2016-03-05 ENCOUNTER — Ambulatory Visit: Payer: Self-pay

## 2016-05-21 ENCOUNTER — Ambulatory Visit: Payer: Self-pay

## 2016-05-22 ENCOUNTER — Ambulatory Visit (INDEPENDENT_AMBULATORY_CARE_PROVIDER_SITE_OTHER): Payer: Self-pay | Admitting: *Deleted

## 2016-05-22 DIAGNOSIS — E538 Deficiency of other specified B group vitamins: Secondary | ICD-10-CM

## 2016-05-22 MED ORDER — CYANOCOBALAMIN 1000 MCG/ML IJ SOLN
1000.0000 ug | Freq: Once | INTRAMUSCULAR | Status: AC
  Administered 2016-05-22: 1000 ug via INTRAMUSCULAR

## 2016-05-22 NOTE — Progress Notes (Signed)
Patient in today for B12 injection . Injection given in right deltoid, patient without complaints, site unremarkable.

## 2016-08-05 ENCOUNTER — Telehealth: Payer: Self-pay | Admitting: Family Medicine

## 2016-08-05 NOTE — Telephone Encounter (Signed)
Grandkids about to get kicked out of daycare.  Needs copy of last physical and shot records.  She knows they are overdue for an appointment.  OK to give copies provided grandmother brings written release from mom.

## 2016-08-05 NOTE — Telephone Encounter (Signed)
Pt would like dr Andria Frames to call her.  She has a "general situation she wants to discuss with him"

## 2017-03-02 ENCOUNTER — Ambulatory Visit (INDEPENDENT_AMBULATORY_CARE_PROVIDER_SITE_OTHER): Payer: Self-pay | Admitting: *Deleted

## 2017-03-02 DIAGNOSIS — Z111 Encounter for screening for respiratory tuberculosis: Secondary | ICD-10-CM

## 2017-03-02 DIAGNOSIS — E538 Deficiency of other specified B group vitamins: Secondary | ICD-10-CM

## 2017-03-02 MED ORDER — CYANOCOBALAMIN 1000 MCG/ML IJ SOLN
1000.0000 ug | Freq: Once | INTRAMUSCULAR | Status: AC
  Administered 2017-03-02: 1000 ug via INTRAMUSCULAR

## 2017-03-02 NOTE — Progress Notes (Signed)
   Patient presents for PPD placement for work Denies previous positive TB test  Denies known exposure to TB   Tuberculin skin test applied to left ventral forearm.  Patient aware that she needs to return in 48-72 hours for PPD reading. Appt 1/23 at 4:10.  Patient also requesting Vit B 12 inj. On current med list as may give weekly. Vit B12 given Right deltoid without incident. Hubbard Hartshorn, RN, BSN

## 2017-03-04 ENCOUNTER — Encounter: Payer: Self-pay | Admitting: Family Medicine

## 2017-03-04 ENCOUNTER — Ambulatory Visit (INDEPENDENT_AMBULATORY_CARE_PROVIDER_SITE_OTHER): Payer: Self-pay | Admitting: Family Medicine

## 2017-03-04 ENCOUNTER — Other Ambulatory Visit: Payer: Self-pay

## 2017-03-04 DIAGNOSIS — E538 Deficiency of other specified B group vitamins: Secondary | ICD-10-CM

## 2017-03-04 DIAGNOSIS — R946 Abnormal results of thyroid function studies: Secondary | ICD-10-CM

## 2017-03-04 DIAGNOSIS — Z87892 Personal history of anaphylaxis: Secondary | ICD-10-CM

## 2017-03-04 DIAGNOSIS — Z1239 Encounter for other screening for malignant neoplasm of breast: Secondary | ICD-10-CM

## 2017-03-04 DIAGNOSIS — I10 Essential (primary) hypertension: Secondary | ICD-10-CM

## 2017-03-04 MED ORDER — EPINEPHRINE 0.15 MG/0.15ML IJ SOAJ: 0.1500 mg | INTRAMUSCULAR | 1 refills | Status: DC | PRN

## 2017-03-04 NOTE — Patient Instructions (Signed)
I will call with lab test results.

## 2017-03-04 NOTE — Assessment & Plan Note (Signed)
Refill epi pen  

## 2017-03-05 ENCOUNTER — Telehealth: Payer: Self-pay | Admitting: Family Medicine

## 2017-03-05 LAB — CMP14+EGFR
A/G RATIO: 1.4 (ref 1.2–2.2)
ALBUMIN: 4.2 g/dL (ref 3.5–5.5)
ALT: 12 IU/L (ref 0–32)
AST: 20 IU/L (ref 0–40)
Alkaline Phosphatase: 83 IU/L (ref 39–117)
BILIRUBIN TOTAL: 0.3 mg/dL (ref 0.0–1.2)
BUN / CREAT RATIO: 11 (ref 9–23)
BUN: 8 mg/dL (ref 6–24)
CHLORIDE: 106 mmol/L (ref 96–106)
CO2: 26 mmol/L (ref 20–29)
Calcium: 9.7 mg/dL (ref 8.7–10.2)
Creatinine, Ser: 0.72 mg/dL (ref 0.57–1.00)
GFR, EST AFRICAN AMERICAN: 108 mL/min/{1.73_m2} (ref >59)
GFR, EST NON AFRICAN AMERICAN: 93 mL/min/{1.73_m2} (ref >59)
Globulin, Total: 2.9 g/dL (ref 1.5–4.5)
Glucose: 116 mg/dL — ABNORMAL HIGH (ref 65–99)
POTASSIUM: 3.4 mmol/L — AB (ref 3.5–5.2)
Sodium: 146 mmol/L — ABNORMAL HIGH (ref 134–144)
TOTAL PROTEIN: 7.1 g/dL (ref 6.0–8.5)

## 2017-03-05 LAB — CBC
HEMOGLOBIN: 10.7 g/dL — AB (ref 11.1–15.9)
Hematocrit: 32.9 % — ABNORMAL LOW (ref 34.0–46.6)
MCH: 27.6 pg (ref 26.6–33.0)
MCHC: 32.5 g/dL (ref 31.5–35.7)
MCV: 85 fL (ref 79–97)
Platelets: 303 10*3/uL (ref 150–379)
RBC: 3.88 x10E6/uL (ref 3.77–5.28)
RDW: 16.4 % — ABNORMAL HIGH (ref 12.3–15.4)
WBC: 4.5 10*3/uL (ref 3.4–10.8)

## 2017-03-05 LAB — TSH: TSH: 1.21 u[IU]/mL (ref 0.450–4.500)

## 2017-03-05 NOTE — Assessment & Plan Note (Signed)
Well controled.  No change 

## 2017-03-05 NOTE — Progress Notes (Signed)
   Subjective:    Patient ID: Meghan [name], female    DOB: 1960-10-02, 57 y.o.   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  Needs annual letter/eval saying she is healthy and not infectious.  TB skin test read today and negative.  Letter provided. 2. Hypertension.  Good BPs at home.  Rushed today and traffic making her late.  She believes this explains BP elevation. 3. Health maint.  Needs mammo.  Otherwise up to date. 4. Needs refill on epi pen.  History of anaphylaxis.    Review of Systems     Objective:   Physical Exam Note: Repeat BP done at visit and entered late. Lungs clear Cardiac RRR without m or g. Abd benign.        Assessment & Plan:

## 2017-03-05 NOTE — Assessment & Plan Note (Signed)
Hgb improved and MCV returned to normal with injections.  Cont B12 replacement.

## 2017-03-05 NOTE — Assessment & Plan Note (Signed)
One final check.  TSH back and normal.  Will resolve this problem.

## 2017-03-05 NOTE — Telephone Encounter (Signed)
Pharmacy called saying something is wrong with the Rx just sent by [facility]. Something about its says to inject .15 and that's not the whole pen and normally you inject the whole pen. Please advise

## 2017-03-09 NOTE — Telephone Encounter (Signed)
Called and clarified with the pharmacy.  I apparently ordered the epipen junior.  She will correct and give patient the regular epipen.

## 2017-03-31 ENCOUNTER — Telehealth: Payer: Self-pay | Admitting: Family Medicine

## 2017-03-31 NOTE — Telephone Encounter (Signed)
Clinical info completed on Medical Report form.  Place form in Dr. Lowella Bandy box for completion.  Katharina Caper, [name], [place]

## 2017-03-31 NOTE — Telephone Encounter (Signed)
Pt came in office and dropped some forms (Staff medical report and Tuberculin Test) to be answer and sign by her MD. Last DOS 03-04-2017. If have any question please call [phone]. Forms were placed in [employer].

## 2017-04-01 NOTE — Telephone Encounter (Signed)
Patient made aware form is complete. Mailed to address on form at her request. Copy made for scanning. Danley Danker, RN [facility] RN)

## 2017-04-01 NOTE — Telephone Encounter (Signed)
Form completed.

## 2017-04-27 ENCOUNTER — Ambulatory Visit: Payer: Medicaid Other

## 2017-05-04 ENCOUNTER — Other Ambulatory Visit: Payer: Self-pay | Admitting: Family Medicine

## 2017-05-04 DIAGNOSIS — Z1231 Encounter for screening mammogram for malignant neoplasm of breast: Secondary | ICD-10-CM

## 2017-05-18 ENCOUNTER — Ambulatory Visit
Admission: RE | Admit: 2017-05-18 | Discharge: 2017-05-18 | Disposition: A | Discharge status: Home / Self Care | Payer: No Typology Code available for payment source | Source: Ambulatory Visit | Attending: Family Medicine | Admitting: Family Medicine

## 2017-05-18 DIAGNOSIS — Z1231 Encounter for screening mammogram for malignant neoplasm of breast: Secondary | ICD-10-CM

## 2017-06-11 ENCOUNTER — Other Ambulatory Visit: Payer: Self-pay

## 2017-06-11 ENCOUNTER — Ambulatory Visit (INDEPENDENT_AMBULATORY_CARE_PROVIDER_SITE_OTHER): Payer: Self-pay | Admitting: Family Medicine

## 2017-06-11 ENCOUNTER — Ambulatory Visit
Admission: RE | Admit: 2017-06-11 | Discharge: 2017-06-11 | Disposition: A | Discharge status: Home / Self Care | Payer: Medicaid Other | Source: Ambulatory Visit | Attending: Family Medicine | Admitting: Family Medicine

## 2017-06-11 ENCOUNTER — Encounter: Payer: Self-pay | Admitting: Family Medicine

## 2017-06-11 VITALS — BP 148/88 | HR 63 | Temp 98.2°F | Ht 67.0 in | Wt 176.6 lb

## 2017-06-11 DIAGNOSIS — M159 Polyosteoarthritis, unspecified: Secondary | ICD-10-CM

## 2017-06-11 DIAGNOSIS — R350 Frequency of micturition: Secondary | ICD-10-CM | POA: Insufficient documentation

## 2017-06-11 DIAGNOSIS — L821 Other seborrheic keratosis: Secondary | ICD-10-CM | POA: Insufficient documentation

## 2017-06-11 LAB — POCT UA - MICROSCOPIC ONLY

## 2017-06-11 LAB — POCT URINALYSIS DIP (MANUAL ENTRY)
Bilirubin, UA: NEGATIVE
Blood, UA: NEGATIVE
Glucose, UA: NEGATIVE mg/dL
Ketones, POC UA: NEGATIVE mg/dL
NITRITE UA: NEGATIVE
PH UA: 6 (ref 5.0–8.0)
PROTEIN UA: NEGATIVE mg/dL
Spec Grav, UA: 1.015 (ref 1.010–1.025)
Urobilinogen, UA: 0.2 E.U./dL

## 2017-06-11 NOTE — Assessment & Plan Note (Signed)
Treated with cryotherapy. 

## 2017-06-11 NOTE — Progress Notes (Signed)
U

## 2017-06-11 NOTE — Assessment & Plan Note (Signed)
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  Will image the worst (right) knee.

## 2017-06-11 NOTE — Assessment & Plan Note (Signed)
UA reassuring that she has no UTI.

## 2017-06-11 NOTE — Progress Notes (Signed)
   Subjective:    Patient ID: Meghan [name], female    DOB: 11/21/1960, 57 y.o.   MRN: [id number]  HPI Multiple issues. 1. Concerning skin lesion back of right calf. 2. New job that required much bending and standing.  Both knees hurt and swelling.  Patient clearly describes joint swelling, not leg swelling.  No trauma.  Rt > Left.  Has hx of osteoarthritis multiple sites but no previous knee imaging.  No fever.  Pain and swelling nicely improving as she has limited activity. 3. Recent UTI sx.  Took some OTC meds.  Seems improved.    Review of Systems     Objective:   Physical Exam  Bilateral knees - perhaps small effussion.  Ligaments intact to provocative testing.  Prominent bones. Calf lesion is a seb K, which I froze.        Assessment & Plan:

## 2017-06-11 NOTE — Patient Instructions (Addendum)
The leg spot that I froze will blister up.  Keep a bandaid and some neosporin on it.  I will call with x ray results.   The urine is clear. Next thing due for you will be a flu shot in the fall.

## 2017-10-20 ENCOUNTER — Other Ambulatory Visit: Payer: Self-pay

## 2017-10-20 ENCOUNTER — Ambulatory Visit (INDEPENDENT_AMBULATORY_CARE_PROVIDER_SITE_OTHER): Payer: Self-pay | Admitting: Family Medicine

## 2017-10-20 ENCOUNTER — Encounter: Payer: Self-pay | Admitting: Family Medicine

## 2017-10-20 VITALS — BP 130/80 | HR 73 | Temp 98.0°F | Ht 66.5 in | Wt 175.0 lb

## 2017-10-20 DIAGNOSIS — E538 Deficiency of other specified B group vitamins: Secondary | ICD-10-CM

## 2017-10-20 DIAGNOSIS — L439 Lichen planus, unspecified: Secondary | ICD-10-CM | POA: Insufficient documentation

## 2017-10-20 DIAGNOSIS — L989 Disorder of the skin and subcutaneous tissue, unspecified: Secondary | ICD-10-CM

## 2017-10-20 DIAGNOSIS — D531 Other megaloblastic anemias, not elsewhere classified: Secondary | ICD-10-CM

## 2017-10-20 NOTE — Progress Notes (Signed)
   [facility] Phone: [phone]   cc: left leg lesion  Subjective:  Meghan [name] is here for a followup of a previous visit for lesions on her legs.  During her last appointment on 5/2 she had a lesion on the back of her right calf, which was thought to be a seborrheic keratoses and was frozen.  Since that time she has had multiple other lesions pop up on her left leg.  Initially she believes it started with ant bites on her feet.  Within a week she noticed a cluster of similar lesions above her left knee medially. Within approximately another week she noticed a similar cluster of lesions more proximally on her inner left thigh.  She states they are not painful and 'don't really itch'.  She has no systemic symptoms such as fever, fatigue, n/v/d.  She otherwise has no complaints.    ROS: See HPI for pertinent positives and negatives  Objective: BP 130/80 (BP Location: Right Arm, Patient Position: Sitting, Cuff Size: Large)   Pulse 73   Temp 98 F (36.7 C) (Oral)   Ht 5' 6.5" (1.689 m)   Wt 175 lb (79.4 kg)   SpO2 99%   BMI 27.82 kg/m  Gen: NAD, alert and oriented, cooperative with exam Msk: No edema, warm, normal tone, moves UE/LE spontaneously Skin: patient has multiple clusters of purple/dark raised circular lesions that are approximately 0.5-1.0cm in diameter along her left leg.  Most notably there is a cluster above the left knee medially, and the medial left thigh near the groin.  There are similar smaller lesions, which are more hyperpigmented than purple in color on the left foot. These lesions are raised and do not blanch when pressed. The right foot has a single lesion on the dorsum and the lesion on the back of the right calf that has recently been frozen.  No similar lesions seen on trunk or upper extremities.   Psych: Appropriate behavior  Assessment/Plan: Skin lesion of left leg Multiple clusters of lesions mainly on the left leg that started  distally and spread proximally which have a purpuric appearance to them.  nonblanching and raised, they do not appear to be seborrheic keratoses and previous lesions have been resistant to freezing.  Differential for right now is some kind of vasculitis vs granulomatous cause.  No systemic symptoms however.  Possible they are granuloma annulare or similar condition limited to the skin.  Patient to come back to clinic on 9/27 for biopsy.      Clemetine Marker, MD PGY-1

## 2017-10-20 NOTE — Patient Instructions (Signed)
It was nice to meet you today,   I am not convinced these new skin lesions on your legs are the seborrheic keratoses that you talked about with Dr. Andria Frames.  In order to get a better idea of what it could be I have scheduled you for a biopsy with me on 9/27 at 1:30pm. The biopsy should give [place] a better idea of what is causing these bumps.    If you start to notice any new symptoms like fever, diarrhea, vomiting, etc..then please try to make an appointment sooner.    Thank you,   Clemetine Marker, MD.

## 2017-10-20 NOTE — Assessment & Plan Note (Signed)
Multiple clusters of lesions mainly on the left leg that started distally and spread proximally which have a purpuric appearance to them.  nonblanching and raised, they do not appear to be seborrheic keratoses and previous lesions have been resistant to freezing.  Differential for right now is some kind of vasculitis vs granulomatous cause.  No systemic symptoms however.  Possible they are granuloma annulare or similar condition limited to the skin.  Patient to come back to clinic on 9/27 for biopsy.

## 2017-11-06 ENCOUNTER — Ambulatory Visit: Payer: Medicaid Other | Admitting: Family Medicine

## 2017-11-12 ENCOUNTER — Ambulatory Visit (INDEPENDENT_AMBULATORY_CARE_PROVIDER_SITE_OTHER): Payer: Self-pay | Admitting: Family Medicine

## 2017-11-12 ENCOUNTER — Other Ambulatory Visit: Payer: Self-pay

## 2017-11-12 ENCOUNTER — Encounter: Payer: Self-pay | Admitting: Family Medicine

## 2017-11-12 VITALS — BP 150/102 | HR 61 | Temp 98.2°F | Ht 67.0 in | Wt 177.4 lb

## 2017-11-12 DIAGNOSIS — Z23 Encounter for immunization: Secondary | ICD-10-CM

## 2017-11-12 DIAGNOSIS — L989 Disorder of the skin and subcutaneous tissue, unspecified: Secondary | ICD-10-CM

## 2017-11-12 DIAGNOSIS — L439 Lichen planus, unspecified: Secondary | ICD-10-CM

## 2017-11-13 ENCOUNTER — Encounter: Payer: Self-pay | Admitting: Family Medicine

## 2017-11-13 MED ORDER — TRIAMCINOLONE ACETONIDE 0.5 % EX CREA: 1.0000 "application " | TOPICAL_CREAM | Freq: Three times a day (TID) | CUTANEOUS | 1 refills | Status: DC

## 2017-11-13 NOTE — Addendum Note (Signed)
Addended by: [name] on: 11/13/2017 02:14 PM   Modules accepted: Orders

## 2017-11-13 NOTE — Progress Notes (Signed)
   Subjective:    Patient ID: Meghan [name], female    DOB: Dec 03, 1960, 57 y.o.   MRN: [id number]  HPI Returns for worsening rash on legs/possible biopsy.  Patient has developed a series of dark plaques effecting both legs.  Multiple patches which are mildly pruritic.  No systemic symtoms No oral lesions.    Review of Systems     Objective:   Physical Exam Multiple plaques of legs smallest being dime sized and larges being quarter sized.  Mostly regular borders, dark, not inflamed.    Informed consent: biopsied a relitively new and typical lesion on the medial aspect of the left leg.  3 mm punch biopsy with marcaine anesthesia due to her dubious history of lidocaine allergy.  Patient tolerated procedure well.        Assessment & Plan:

## 2017-11-13 NOTE — Patient Instructions (Signed)
Keep site clean, dry and covered with triple antibiotic ointment. I will call with the pathology report and we will decide how best to treat. You got a flu shot today.

## 2017-11-13 NOTE — Assessment & Plan Note (Addendum)
Multiple with rapid onset.  Seems to be in the plaque family.  Clinically not malignant.  Await biopsy result.  Biopsy confirms lichen planus.  Informed and given topical high potency steroids.

## 2017-12-03 ENCOUNTER — Telehealth: Payer: Self-pay | Admitting: *Deleted

## 2017-12-03 DIAGNOSIS — R1030 Lower abdominal pain, unspecified: Secondary | ICD-10-CM | POA: Insufficient documentation

## 2017-12-03 DIAGNOSIS — L439 Lichen planus, unspecified: Secondary | ICD-10-CM

## 2017-12-03 NOTE — Telephone Encounter (Signed)
Pt calls because she wonders if Dr. Andria Frames would draw some labs on her.   When I asked to her elaborate she states that since her appointment she has had some bouts of diarrhea, feeling warm and nausea. These are not consectutive and dont last long.   Upon speaking with patient some more she is concerned because she was told she could have an autoimmune disorder.    She would like a call back from the MD. Eilidh Marcano, Salome Spotted, [place]

## 2017-12-03 NOTE — Telephone Encounter (Signed)
Recently diagnosed with lichon planus which can have underlying autoimmune cause.  Since Dx, she has had three episodes of cramping lower abdominal "like a period" without diarrhea or vag bleeding.  Wonders if related.  Offered screening blood tests or watchful waiting.  Wants lab testing.

## 2017-12-08 ENCOUNTER — Ambulatory Visit (INDEPENDENT_AMBULATORY_CARE_PROVIDER_SITE_OTHER): Payer: Self-pay | Admitting: Family Medicine

## 2017-12-08 ENCOUNTER — Other Ambulatory Visit: Payer: Self-pay

## 2017-12-08 ENCOUNTER — Telehealth: Payer: Self-pay | Admitting: Family Medicine

## 2017-12-08 ENCOUNTER — Encounter: Payer: Self-pay | Admitting: Family Medicine

## 2017-12-08 ENCOUNTER — Other Ambulatory Visit: Payer: Self-pay | Admitting: *Deleted

## 2017-12-08 VITALS — BP 160/80 | HR 53 | Temp 98.3°F | Ht 67.0 in | Wt 175.8 lb

## 2017-12-08 DIAGNOSIS — L439 Lichen planus, unspecified: Secondary | ICD-10-CM

## 2017-12-08 DIAGNOSIS — Z111 Encounter for screening for respiratory tuberculosis: Secondary | ICD-10-CM

## 2017-12-08 DIAGNOSIS — J309 Allergic rhinitis, unspecified: Secondary | ICD-10-CM

## 2017-12-08 DIAGNOSIS — E538 Deficiency of other specified B group vitamins: Secondary | ICD-10-CM

## 2017-12-08 DIAGNOSIS — R1030 Lower abdominal pain, unspecified: Secondary | ICD-10-CM

## 2017-12-08 LAB — POCT UA - MICROSCOPIC ONLY

## 2017-12-08 LAB — POCT URINALYSIS DIP (MANUAL ENTRY)
Bilirubin, UA: NEGATIVE
Glucose, UA: NEGATIVE mg/dL
Ketones, POC UA: NEGATIVE mg/dL
NITRITE UA: NEGATIVE
Protein Ur, POC: NEGATIVE mg/dL
SPEC GRAV UA: 1.015 (ref 1.010–1.025)
UROBILINOGEN UA: 0.2 U/dL
pH, UA: 6 (ref 5.0–8.0)

## 2017-12-08 MED ORDER — AZITHROMYCIN 250 MG PO TABS: ORAL_TABLET | ORAL | 0 refills | Status: DC

## 2017-12-08 MED ORDER — CYANOCOBALAMIN 1000 MCG/ML IJ SOLN
1000.0000 ug | Freq: Once | INTRAMUSCULAR | Status: AC
  Administered 2017-12-08: 1000 ug via INTRAMUSCULAR

## 2017-12-08 MED ORDER — ALBUTEROL SULFATE HFA 108 (90 BASE) MCG/ACT IN AERS: 2.0000 | INHALATION_SPRAY | Freq: Four times a day (QID) | RESPIRATORY_TRACT | 0 refills | Status: DC | PRN

## 2017-12-08 MED ORDER — FLUTICASONE PROPIONATE 50 MCG/ACT NA SUSP: 2.0000 | Freq: Every day | NASAL | 2 refills | Status: DC

## 2017-12-08 NOTE — Telephone Encounter (Signed)
Patient needs a quantiferon gold lab test for work.  Can she get orders for that?  Please call patient to advise.

## 2017-12-08 NOTE — Progress Notes (Signed)
   Subjective:   Patient ID: Meghan [name]    DOB: 17-Sep-1960, 57 y.o. female   MRN: [id number]  CC: nasal drainage   HPI: Meghan [name] is a 57 y.o. female who presents to clinic today for nasal drainage.  She is also here for future labs ordered at prior visit.  Nasal drainage Patient reports nasal drainage since the past 1 week.  Started with cough and congestion.  Cough is dry, nonproductive.  She has not taken any medications at home for this.  She reports she has been eating and drinking normally.  No fevers, chills, nausea, vomiting.  No abdominal pain or diarrhea.  No sick contacts.  No recent travel.    ROS: See HPI for pertinent ROS.  Social: She is a former smoker, quit 1997 Medications reviewed. Objective:   BP (!) 160/80   Pulse (!) 53   Temp 98.3 F (36.8 C) (Oral)   Ht 5\' 7"  (1.702 m)   Wt 175 lb 12.8 oz (79.7 kg)   SpO2 99%   BMI 27.53 kg/m  Vitals and nursing note reviewed.  General: 57 year old female, NAD HEENT: NCAT, EOMI, PERRL, clear rhinorrhea, MMM, oropharynx clear without tonsillar erythema or exudate noted Neck: supple, non-tender, normal ROM, no LAD  CV: RRR no MRG  Lungs: CTAB, normal effort  Abdomen: soft, NTND, +bs  Skin: warm, dry, no rash Extremities: warm and well perfused, normal tone  Assessment & Plan:   Allergic rhinitis Discussed that this is likely secondary to allergic rhinitis rather than sinusitis at this time given short duration of symptoms.  I have refilled her albuterol and prescribed Flonase to help relieve symptoms.  Patient requests antibiotic to help get her through this week in case this worsens. Discussed that this is likely not bacterial however will send in course of azithromycin in case of worsening cold symptoms.  -Rx: azithromycin  -flonase  -Advised saline rinse -Follow-up if symptoms worsen or do not improve  Orders Placed This Encounter  Procedures  . Sedimentation Rate  . POCT urinalysis dipstick  .  POCT UA - Microscopic Only   Meds ordered this encounter  Medications  . albuterol (PROVENTIL HFA;VENTOLIN HFA) 108 (90 Base) MCG/ACT inhaler    Sig: Inhale 2 puffs into the lungs every 6 (six) hours as needed for wheezing or shortness of breath.    Dispense:  1 Inhaler    Refill:  0  . azithromycin (ZITHROMAX) 250 MG tablet    Sig: Take 2 tabs on day 1, followed by 1 tab daily.    Dispense:  6 tablet    Refill:  0  . fluticasone (FLONASE) 50 MCG/ACT nasal spray    Sig: Place 2 sprays into both nostrils daily.    Dispense:  16 g    Refill:  2  . cyanocobalamin ((VITAMIN B-12)) injection 1,000 mcg   Lovenia Kim, MD [place], PGY-3 12/13/2017 8:14 PM

## 2017-12-08 NOTE — Progress Notes (Signed)
Pt walks into today requesting a quantiferon gold for her job.  She has future orders for labwork already, wants to know if this can be added.   Dr. Andria Frames gave verbal orders for me to add this lab.  Done as requested. [place], Salome Spotted, CMA

## 2017-12-08 NOTE — Telephone Encounter (Signed)
Employment physical form dropped off for at front desk for completion.  Verified that patient section of form has been completed.  Last DOS/WCC with PCP was 11/12/17.  Placed form in team folder to be completed by clinical staff.  Crista Luria

## 2017-12-08 NOTE — Telephone Encounter (Signed)
Done

## 2017-12-08 NOTE — Telephone Encounter (Signed)
Physical form placed in Dr. Lowella Bandy box for completion.  Meghan [name], [name], [place]

## 2017-12-08 NOTE — Patient Instructions (Signed)
It was nice meeting you today.  You were seen in clinic for nasal congestion and drainage which is most likely due to a viral upper respiratory tract infection.  I am prescribing you a course of antibiotics called azithromycin.  Take 2 tablets today followed by 1 tablet daily until complete.  I would advise warm fluids to stay well-hydrated.  Additionally, I have prescribed you Flonase for your seasonal allergies.  You may take Tylenol as needed for pain and fever.  If your symptoms worsen or do not improve in the next 1 week, please make an appointment to be seen by a provider again.  Please call clinic if you have any questions.  Lovenia Kim MD

## 2017-12-09 NOTE — Telephone Encounter (Signed)
I will fill our form as soon as quantiferon gold test results are back.

## 2017-12-11 LAB — CMP14+EGFR
ALT: 12 IU/L (ref 0–32)
AST: 18 IU/L (ref 0–40)
Albumin/Globulin Ratio: 1.5 (ref 1.2–2.2)
Albumin: 4.1 g/dL (ref 3.5–5.5)
Alkaline Phosphatase: 85 IU/L (ref 39–117)
BUN/Creatinine Ratio: 11 (ref 9–23)
BUN: 9 mg/dL (ref 6–24)
Bilirubin Total: 0.5 mg/dL (ref 0.0–1.2)
CO2: 25 mmol/L (ref 20–29)
Calcium: 9.6 mg/dL (ref 8.7–10.2)
Chloride: 104 mmol/L (ref 96–106)
Creatinine, Ser: 0.79 mg/dL (ref 0.57–1.00)
GFR calc Af Amer: 96 mL/min/{1.73_m2} (ref >59)
GFR calc non Af Amer: 83 mL/min/{1.73_m2} (ref >59)
Globulin, Total: 2.7 g/dL (ref 1.5–4.5)
Glucose: 93 mg/dL (ref 65–99)
Potassium: 3.9 mmol/L (ref 3.5–5.2)
Sodium: 143 mmol/L (ref 134–144)
Total Protein: 6.8 g/dL (ref 6.0–8.5)

## 2017-12-11 LAB — QUANTIFERON-TB GOLD PLUS
QuantiFERON Mitogen Value: 9.66 IU/mL
QuantiFERON Nil Value: 0.02 IU/mL
QuantiFERON TB1 Ag Value: 0.02 IU/mL
QuantiFERON TB2 Ag Value: 0.02 IU/mL
QuantiFERON-TB Gold Plus: NEGATIVE

## 2017-12-11 LAB — CBC WITH DIFFERENTIAL/PLATELET
BASOS: 0 %
Basophils Absolute: 0 10*3/uL (ref 0.0–0.2)
EOS (ABSOLUTE): 0.1 10*3/uL (ref 0.0–0.4)
EOS: 1 %
HEMATOCRIT: 32.1 % — AB (ref 34.0–46.6)
Hemoglobin: 10.2 g/dL — ABNORMAL LOW (ref 11.1–15.9)
Immature Grans (Abs): 0 10*3/uL (ref 0.0–0.1)
Immature Granulocytes: 0 %
Lymphocytes Absolute: 1.6 10*3/uL (ref 0.7–3.1)
Lymphs: 34 %
MCH: 26.4 pg — AB (ref 26.6–33.0)
MCHC: 31.8 g/dL (ref 31.5–35.7)
MCV: 83 fL (ref 79–97)
MONOS ABS: 0.4 10*3/uL (ref 0.1–0.9)
Monocytes: 10 %
NEUTROS ABS: 2.5 10*3/uL (ref 1.4–7.0)
NEUTROS PCT: 55 %
PLATELETS: 311 10*3/uL (ref 150–450)
RBC: 3.86 x10E6/uL (ref 3.77–5.28)
RDW: 15.7 % — AB (ref 12.3–15.4)
WBC: 4.6 10*3/uL (ref 3.4–10.8)

## 2017-12-11 LAB — RHEUMATOID FACTOR

## 2017-12-11 LAB — ANA: Anti Nuclear Antibody(ANA): NEGATIVE

## 2017-12-11 NOTE — Telephone Encounter (Signed)
Form completed and patient notified

## 2018-02-04 ENCOUNTER — Other Ambulatory Visit: Payer: Self-pay

## 2018-02-04 ENCOUNTER — Telehealth: Payer: Self-pay | Admitting: Allergy

## 2018-02-04 ENCOUNTER — Ambulatory Visit: Payer: Medicaid Other | Admitting: Family Medicine

## 2018-02-04 ENCOUNTER — Ambulatory Visit (INDEPENDENT_AMBULATORY_CARE_PROVIDER_SITE_OTHER): Payer: Self-pay | Admitting: Family Medicine

## 2018-02-04 VITALS — BP 142/92 | HR 71 | Temp 98.2°F | Ht 67.0 in | Wt 176.4 lb

## 2018-02-04 DIAGNOSIS — Z516 Encounter for desensitization to allergens: Secondary | ICD-10-CM

## 2018-02-04 DIAGNOSIS — N898 Other specified noninflammatory disorders of vagina: Secondary | ICD-10-CM

## 2018-02-04 NOTE — Progress Notes (Signed)
   CC: vaginal discharge  HPI  Vaginal discharge - some cramping in her stomach, eating and voiding normally. Normal BMs. New female sexual partner eaarlier this year. She tested positive for trich and BV at outpatient urgent care ([facility], see care everywhere). She is still having discharge. This was on 12/26. She is allergic to metronidazole, states her reaction was swelling and hives but this was many years ago.   ROS: Denies CP, SOB, abdominal pain, dysuria, changes in BMs.   CC, SH/smoking status, and VS noted  Objective: BP (!) 142/92   Pulse 71   Temp 98.2 F (36.8 C) (Oral)   Ht 5\' 7"  (1.702 m)   Wt 176 lb 6.4 oz (80 kg)   SpO2 100%   BMI 27.63 kg/m  Gen: NAD, alert, cooperative, and pleasant. HEENT: NCAT, EOMI, PERRL CV: RRR, no murmur Resp: CTAB, no wheezes, non-labored Neuro: Alert and oriented, Speech clear, No gross deficits  Assessment and plan:  Vaginal discharge: Patient in today with trichomoniasis positive vaginal discharge at urgent care, see care everywhere.  Unfortunately, she reports an allergy to metronidazole.  This was many years ago.  CDC recommendations state no additional options beyond metronidazole.  They recommend desensitization.  I spoke with Dr. Linus Salmons, [place] on-call, who also recommended desensitization.  Additionally, I also spoke to Dr. Nelva Bush with asthma and allergy, who reports they do not do desensitization in their office but they can likely do a graded challenge in the office and the patient would be a good candidate for this due to her allergy many years ago.  I asked the patient to call the asthma and allergy office tomorrow morning and placed an urgent referral.  Ralene Ok, MD, PGY3 02/04/2018 4:43 PM

## 2018-02-04 NOTE — Patient Instructions (Signed)
It was a pleasure to see you today! Thank you for choosing [facility] for your primary care. Ashley Akin was seen for medication allergy.   Our plans for today were:  I have placed an urgent referral to the allergy center again. They should hopefully call you tomorrow, but you could also call them at [phone].  Please call us if you have issues accomplishing this.   Best,  Dr. Lindell Noe

## 2018-02-04 NOTE — Telephone Encounter (Signed)
Called by fam med Dr. Lindell Noe (urgent care) regarding pt.   She is trichomonas + and has a reported remote history of metrodinazole allergy.  Dr. Lindell Noe has already placed urgent referral.   Sending as well to make appt for pt for new pt eval of metrodinazole allergy with likely graded challenge in office after eval.

## 2018-02-05 ENCOUNTER — Ambulatory Visit (INDEPENDENT_AMBULATORY_CARE_PROVIDER_SITE_OTHER): Payer: Self-pay | Admitting: Allergy

## 2018-02-05 ENCOUNTER — Encounter: Payer: Self-pay | Admitting: Allergy

## 2018-02-05 VITALS — BP 150/86 | HR 64 | Temp 98.5°F | Resp 18 | Ht 64.5 in | Wt 177.8 lb

## 2018-02-05 DIAGNOSIS — T50905D Adverse effect of unspecified drugs, medicaments and biological substances, subsequent encounter: Secondary | ICD-10-CM

## 2018-02-05 MED ORDER — METRONIDAZOLE 500 MG PO TABS: ORAL_TABLET | ORAL | 0 refills | Status: DC

## 2018-02-05 NOTE — Patient Instructions (Addendum)
Adverse drug reaction  -We discussed today your symptoms following metronidazole use in the past.  Based on the timing of this reaction and the symptoms I think we can perform a graded drug challenge in the office.  Graded drug challenge can help to determine if you are no longer allergic to the medication.  We will prescribe the medication to bring to the office for the challenge (please pick this up prior to the visit).  You will need to hold all antihistamines for 3 days prior to the scheduled challenge.  The challenge takes about 2 hours minimum.  You are still to avoid metronidazole until able to be challenged.  Follow-up for graded drug challenge on Monday at 1:30 with Dr. Maudie Mercury

## 2018-02-05 NOTE — Progress Notes (Signed)
New Patient Note  RE: Meghan [name] MRN: [id number] DOB: 07-Apr-1960 Date of Office Visit: 02/05/2018  Referring provider: Alveda Reasons, MD Primary care provider: [name], MD  Chief Complaint: Medication allergy  History of present illness: Meghan [name] is a 57 y.o. female presenting today for consultation for drug allergy.  She was seen in an urgent care yesterday for complaints of vaginal discharge that was positive for trichomonas and BV.  She has reported allergy to metronidazole which per the UC doctor she saw is the best/only treatment plan for trichomonas that is effective.  Thus she was referred to see allergist about this drug allergy.  She states she doesn't really recall taking flagyl in the past.  She states her records in Epic report she took it in 2009.  She is not sure what she took it for.  She believes she remembers having itching and swelling.  She feels it may have been early into the course as "if I ever have a medication problem it is early on".  She feels like it was first and only time she has had that medication before.    She is desperate at this time and wants treatment for her STI immediately and does not want to wait any longer for treatment.  She states she has been having symptoms related to STI for several weeks now.    She does have history of allergic rhinitis and has used zyrtec in the past as well as flonase.  She also has a shellfish allergy and has had epipen in the past but currently has an outdated one.  She also has a history of asthma and has an albuterol inhaler.  She denies any asthma symptoms at this time.    Review of systems: Review of Systems  Constitutional: Negative for chills, fever and malaise/fatigue.  HENT: Positive for congestion. Negative for ear discharge, ear pain, nosebleeds and sore throat.   Eyes: Negative for pain, discharge and redness.  Respiratory: Negative for cough, shortness of breath and wheezing.    Cardiovascular: Negative for chest pain.  Gastrointestinal: Positive for abdominal pain. Negative for constipation, diarrhea, nausea and vomiting.  Musculoskeletal: Negative for joint pain.  Skin: Negative for itching and rash.  Neurological: Negative for headaches.    All other systems negative unless noted above in HPI  Past medical history: Past Medical History:  Diagnosis Date  . Angio-edema   . Asthma   . Urticaria     Past surgical history: Past Surgical History:  Procedure Laterality Date  . ADENOIDECTOMY    . TONSILLECTOMY  1987  . TUBAL LIGATION  1988  . UTERINE ARTERY EMBOLIZATION  2000   for fibroids     Family history:  Family History  Problem Relation Age of Onset  . Hypertension Mother   . Kidney disease Mother   . Cholecystitis Mother   . Hypertension Father   . Diabetes Father   . Kidney disease Daughter     Social history: Lives in a townhome with carpeting with electric heating and central cooling.  No pets in the home.  Dogs and cats outside the home.  She works at [employer]  She denies smoking history.    Medication List: Allergies as of 02/05/2018      Reactions   Codeine    Latex    Per allergist   Lidocaine    Seen by allergist.  Avoid lidocaine.  OK to use mepivacaine  Metronidazole    Morphine    Oxycodone-acetaminophen    Sulfonamide Derivatives       Medication List       Accurate as of February 05, 2018  3:44 PM. Always use your most recent med list.        albuterol 108 (90 Base) MCG/ACT inhaler Commonly known as:  PROVENTIL HFA;VENTOLIN HFA Inhale 2 puffs into the lungs every 6 (six) hours as needed for wheezing or shortness of breath.   cetirizine 10 MG tablet Commonly known as:  ZYRTEC Take 10 mg by mouth daily.   EPINEPHrine 0.15 MG/0.15ML injection Commonly known as:  EPIPEN JR Inject 0.15 mLs (0.15 mg total) into the muscle as needed for anaphylaxis.   metroNIDAZOLE 500 MG tablet Commonly known as:   FLAGYL Please bring to appointment with Meghan [name] for drug challenge.   triamcinolone cream 0.5 % Commonly known as:  KENALOG Apply 1 application topically 3 (three) times daily.       Known medication allergies: Allergies  Allergen Reactions  . Codeine   . Latex     Per allergist  . Lidocaine     Seen by allergist.  Avoid lidocaine.  OK to use mepivacaine   . Metronidazole   . Morphine   . Oxycodone-Acetaminophen   . Sulfonamide Derivatives      Physical examination: Blood pressure (!) 150/86, pulse 64, temperature 98.5 F (36.9 C), temperature source Oral, resp. rate 18, height 5' 4.5" (1.638 m), weight 177 lb 12.8 oz (80.6 kg), SpO2 97 %.  General: Alert, interactive, in no acute distress. HEENT: PERRLA, TMs pearly gray, turbinates mildly edematous without discharge, post-pharynx non erythematous. Neck: Supple without lymphadenopathy. Lungs: Clear to auscultation without wheezing, rhonchi or rales. {no increased work of breathing. CV: Normal S1, S2 without murmurs. Abdomen: Nondistended, nontender. Skin: non-vesicular papules along chin/jaw line 2/2 facial waxing . Extremities:  No clubbing, cyanosis or edema. Neuro:   Grossly intact.  Diagnositics/Labs:  Spirometry: declined today as visit is self pay  Assessment and plan:   Adverse drug reaction  -We discussed today your symptoms following metronidazole use in the past.  Based on the timing of this reaction and the symptoms I think we can perform a graded drug challenge in the office.  Graded drug challenge can help to determine if you are no longer allergic to the medication.  We will prescribe the medication to bring to the office for the challenge (please pick this up prior to the visit).  You will need to hold all antihistamines for 3 days prior to the scheduled challenge.  The challenge takes about 2 hours minimum.  You are still to avoid metronidazole until able to be challenged.  Follow-up for graded  drug challenge on Monday at 1:30 with Meghan [name]  I appreciate the opportunity to take part in Meghan [name]'s care. Please do not hesitate to contact me with questions.  Sincerely,   Meghan Feeler, MD Allergy/Immunology [facility]

## 2018-02-05 NOTE — Telephone Encounter (Signed)
Patient scheduled for 130 in [place] 02-05-2018.

## 2018-02-08 ENCOUNTER — Encounter: Payer: Self-pay | Admitting: Allergy

## 2018-02-08 ENCOUNTER — Ambulatory Visit (INDEPENDENT_AMBULATORY_CARE_PROVIDER_SITE_OTHER): Payer: Self-pay | Admitting: Allergy

## 2018-02-08 VITALS — BP 158/88 | HR 88 | Resp 16

## 2018-02-08 DIAGNOSIS — T50905A Adverse effect of unspecified drugs, medicaments and biological substances, initial encounter: Secondary | ICD-10-CM | POA: Insufficient documentation

## 2018-02-08 DIAGNOSIS — J4520 Mild intermittent asthma, uncomplicated: Secondary | ICD-10-CM

## 2018-02-08 DIAGNOSIS — T50905D Adverse effect of unspecified drugs, medicaments and biological substances, subsequent encounter: Secondary | ICD-10-CM

## 2018-02-08 MED ORDER — METRONIDAZOLE 500 MG PO TABS: 500.0000 mg | ORAL_TABLET | Freq: Two times a day (BID) | ORAL | 0 refills | Status: DC

## 2018-02-08 NOTE — Assessment & Plan Note (Signed)
Diagnosed with asthma and using albuterol less than once a week.  Today's spirometry showed: no over abnormalities.  May use albuterol rescue inhaler 2 puffs or nebulizer every 4 to 6 hours as needed for shortness of breath, chest tightness, coughing, and wheezing. May use albuterol rescue inhaler 2 puffs 5 to 15 minutes prior to strenuous physical activities. Monitor frequency of use.

## 2018-02-08 NOTE — Patient Instructions (Signed)
Start flagyl 500mg  twice a day for 7 days. Take next dose tomorrow morning.   For next 24 hours monitor for hives, swelling, shortness of breath and dizziness. If you see these symptoms, use Benadryl for mild symptoms and epinephrine for more severe symptoms and call 911.  Follow up as needed.

## 2018-02-08 NOTE — Progress Notes (Signed)
Follow Up Note  RE: Meghan [name] MRN: [id number] DOB: [birth year] Date of Office [name]: 02/08/2018  Referring provider: [name], MD Primary care provider: [name], MD  Chief Complaint: Allergy Testing (Drug Challenge)  Assessment and Plan: Meghan [name] is a 57 y.o. female with: Drug reaction Unsure of exact reactions to flagyl in the past but had allergy listed in epic. Patient diagnosed with STI and needs flagyl for the treatment.  Patient passed today's flagyl challenge.  Start flagyl 500mg  po BID x 7 days.   For next 24 hours monitor for hives, swelling, shortness of breath and dizziness. If you see these symptoms, use Benadryl for mild symptoms and go to ER/urgent care for more severe symptoms.   Asthma Diagnosed with asthma and using albuterol less than once a week.  Today's spirometry showed: no over abnormalities.  May use albuterol rescue inhaler 2 puffs or nebulizer every 4 to 6 hours as needed for shortness of breath, chest tightness, coughing, and wheezing. May use albuterol rescue inhaler 2 puffs 5 to 15 minutes prior to strenuous physical activities. Monitor frequency of use.   Return if symptoms worsen or fail to improve.  Plan: Challenge drug: flagyl Challenge as per protocol: Passed Total time: 117 minutes  History of Present Illness: I had the pleasure of seeing Meghan [name] at the [facility] on 02/08/2018. She is a 57 y.o. female, who is being followed for drug allergy. Today she is here for flagyl drug challenge. Her previous allergy office [name] was on 02/05/2018.   Chief Complaint: Challenge testing to flagyl.  History of Reaction: Patient is unsure if she ever took flagyl in the past but Epic reports she took it in 2009.  She is not sure what she took it for.  She believes she remembers having itching and swelling. She does have other medication allergies.   Patient was diagnosed with STI  and having symptoms related to it for a few weeks now and anxious to get treated.   Labs: None   Interval History: Patient has not been ill, she has not had any accidental exposures to the culprit medication.   Recent/Current History: Pulmonary disease: yes has diagnosis of asthma.  Last used albuterol 2-3 weeks ago. Cardiac disease: no Respiratory infection: no Rash: yes on face after facial wax. Itch: no Swelling: no Cough: yes Shortness of breath: no Runny/stuffy nose: yes Itchy eyes: yes Beta-blocker use: no  Patient/guardian was informed of the test procedure with verbalized understanding of the risk of anaphylaxis.   Last antihistamine use: 02/05/2018 Last beta-blocker use: none  Medication List:  Current Outpatient Medications  Medication Sig Dispense Refill  . albuterol (PROVENTIL HFA;VENTOLIN HFA) 108 (90 Base) MCG/ACT inhaler Inhale 2 puffs into the lungs every 6 (six) hours as needed for wheezing or shortness of breath. 1 Inhaler 0  . cetirizine (ZYRTEC) 10 MG tablet Take 10 mg by mouth daily.    Marland Kitchen EPINEPHrine 0.15 MG/0.15ML IJ injection Inject 0.15 mLs (0.15 mg total) into the muscle as needed for anaphylaxis. 1 Device 1  . metroNIDAZOLE (FLAGYL) 500 MG tablet Take 1 tablet (500 mg total) by mouth 2 (two) times daily. 12 tablet 0  . triamcinolone cream (KENALOG) 0.5 % Apply 1 application topically 3 (three) times daily. 454 g 1   Current Facility-Administered Medications  Medication Dose Route Frequency Provider Last Rate Last Dose  . cyanocobalamin ((VITAMIN B-12)) injection 1,000 mcg  1,000 mcg Intramuscular Weekly [name], MD   1,000 mcg at 10/20/17 1026   Allergies: Allergies  Allergen Reactions  . Codeine   . Latex     Per allergist  . Lidocaine     Seen by allergist.  Avoid lidocaine.  OK to use mepivacaine   . Metronidazole   . Morphine   . Oxycodone-Acetaminophen   . Sulfonamide Derivatives    I reviewed her past medical history,  social history, family history, and environmental history and no significant changes have been reported from previous [name] on 02/05/2018.  Review of Systems  Constitutional: Negative for appetite change, chills, fever and unexpected weight change.  HENT: Positive for congestion and rhinorrhea.   Eyes: Positive for itching.  Respiratory: Positive for cough. Negative for chest tightness, shortness of breath and wheezing.   Gastrointestinal: Negative for abdominal pain.  Skin: Positive for rash.  Neurological: Negative for headaches.   Objective: BP (!) 158/88 (BP Location: Left Arm)   Pulse 88   Resp 16   SpO2 99%  There is no height or weight on file to calculate BMI. Physical Exam  Constitutional: She is oriented to person, place, and time. She appears well-developed and well-nourished.  HENT:  Head: Normocephalic and atraumatic.  Right Ear: External ear normal.  Left Ear: External ear normal.  Nose: Nose normal.  Mouth/Throat: Oropharynx is clear and moist.  Eyes: Conjunctivae and EOM are normal.  Neck: Neck supple.  Cardiovascular: Normal rate, regular rhythm and normal heart sounds. Exam reveals no gallop and no friction rub.  No murmur heard. Pulmonary/Chest: Effort normal and breath sounds normal. She has no wheezes. She has no rales.  Lymphadenopathy:    She has no cervical adenopathy.  Neurological: She is alert and oriented to person, place, and time.  Skin: Skin is warm. Rash noted.  Mild papular rash on jawline b/l  Psychiatric: She has a normal mood and affect. Her behavior is normal.  Nursing note and vitals reviewed.  Diagnostics: Spirometry:  Tracings reviewed. Her effort: It was hard to get consistent efforts and there is a question as to whether this reflects a maximal maneuver. FVC: 2.03L FEV1: 1.55L, 70% predicted FEV1/FVC ratio: 76% Interpretation: Spirometry consistent with possible restrictive disease.  Please see scanned spirometry results for  details. Oral Challenge - 02/08/18 1500    Challenge Food/Drug  metronidazole    Food/Drug provided by  patient    Comments  vitals at end of challenge    BP  154/86    Pulse  72    Respirations  16    Lungs  clear    Skin  clear     Mouth  clear    Time  1450    Dose  62.5mg     Time  1505    Dose  125mg     Time  1520    Dose  312.5mg     BP  158/88    Pulse  88    Respirations  16    Comments  post vitals      Previous notes and tests were reviewed. The plan was reviewed with the patient/family, and all questions/concerned were addressed.  It was my pleasure to see Traniyah today and participate in her care. Please feel free to contact me with any questions or concerns.  Sincerely,  Rexene Alberts, DO Allergy & Immunology  [facility] office: [phone] [place]

## 2018-02-08 NOTE — Assessment & Plan Note (Addendum)
Unsure of exact reactions to flagyl in the past but had allergy listed in epic. Patient diagnosed with STI and needs flagyl for the treatment.  Patient passed today's flagyl challenge.  Start flagyl 500mg  po BID x 7 days.   For next 24 hours monitor for hives, swelling, shortness of breath and dizziness. If you see these symptoms, use Benadryl for mild symptoms and go to ER/urgent care for more severe symptoms.

## 2018-03-08 ENCOUNTER — Telehealth: Payer: Self-pay

## 2018-03-08 ENCOUNTER — Ambulatory Visit (INDEPENDENT_AMBULATORY_CARE_PROVIDER_SITE_OTHER): Payer: PRIVATE HEALTH INSURANCE | Admitting: *Deleted

## 2018-03-08 DIAGNOSIS — E538 Deficiency of other specified B group vitamins: Secondary | ICD-10-CM

## 2018-03-08 MED ORDER — CYANOCOBALAMIN 1000 MCG/ML IJ SOLN
1000.0000 ug | INTRAMUSCULAR | Status: AC
Start: 2018-03-08 — End: 2019-04-02
  Administered 2018-03-08 – 2019-03-03 (×6): 1000 ug via INTRAMUSCULAR

## 2018-03-08 NOTE — Telephone Encounter (Signed)
Pt called nurse line stating she would like to return to nurse clinic for her B12 injections. Per chart review it has been some time since her last B12 injections. I offered pt an apt with pcp as early as this Wednesday. Pt refused. Pt would like for pcp to allow her come to nurse clinic instead to restart B12 injections. Please advise.

## 2018-03-08 NOTE — Progress Notes (Signed)
Pt is here for a b12 injection (see phone note).  One year worth of injections put into MAR. Caron Ode, Salome Spotted, CMA

## 2018-03-08 NOTE — Telephone Encounter (Signed)
Contacted pt and gave her the below information, scheduled her in the 2:30 slot for today, she will be on her way to have this done. Katharina Caper, [name], [place]

## 2018-03-08 NOTE — Telephone Encounter (Signed)
Yes, OK to restart B12 injections at the dose of 1,000 micrograms im each month.

## 2018-04-29 ENCOUNTER — Ambulatory Visit: Payer: PRIVATE HEALTH INSURANCE

## 2018-04-29 ENCOUNTER — Other Ambulatory Visit: Payer: Self-pay

## 2018-04-29 DIAGNOSIS — E538 Deficiency of other specified B group vitamins: Secondary | ICD-10-CM

## 2018-04-29 MED ORDER — CYANOCOBALAMIN 1000 MCG/ML IJ SOLN: 1000.0000 ug | Freq: Once | INTRAMUSCULAR | Status: DC

## 2018-04-29 NOTE — Progress Notes (Signed)
Pt presents in nurse clinic for monthly B12 injection. Injection given in LD, site unremarkable.

## 2018-05-04 ENCOUNTER — Telehealth: Payer: Self-pay

## 2018-05-04 NOTE — Telephone Encounter (Signed)
Pt called nurse line stating she needs a note for work. Pt stated she has been queasy feeling and has had lose stools. Pt denies any fever or SOB at this time. Pt does endorse a "small" cough. When asked how long she needs to be out of work for, "3/24-through the end of the pandemic."  Please advise.

## 2018-05-05 NOTE — Telephone Encounter (Signed)
Letter provided

## 2018-05-05 NOTE — Telephone Encounter (Signed)
Pt calling to check the status Meghan [name], CMA

## 2018-05-17 NOTE — Telephone Encounter (Signed)
Pt is calling for another letter.  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  This is causing her allergies and asthma to "flare up".  She is requesting a letter to be out of work. Christen Bame, CMA

## 2018-05-18 ENCOUNTER — Telehealth: Payer: Self-pay

## 2018-05-18 NOTE — Telephone Encounter (Signed)
Patient called requesting a letter for her job to be excused from work because there is a dog that can not be removed from the environment. It looks like the patient has also called PCP about this same letter. I am unsure if their office is going to do it or not.  Please Advise

## 2018-05-18 NOTE — Telephone Encounter (Signed)
Yes she would need a visit for this to be discussed with testing (skin or lab) to confirm she currently has a dog allergy for a letter to be provided.

## 2018-05-18 NOTE — Telephone Encounter (Signed)
I spent time listening.  And I did provide a note that both she and I could live with.

## 2018-05-18 NOTE — Telephone Encounter (Signed)
Patient PCP provided letter.

## 2018-05-18 NOTE — Telephone Encounter (Signed)
Spoke to patient and informed her that we only saw her last time for a drug allergy. Patient states she has been seen here in the past for her allergy and asthma but her primary care physician had been maintaining her prescriptions. 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 Patient was last seen by Dr. Ishmael Holter in 2012. Can we provide letter?

## 2018-05-24 ENCOUNTER — Other Ambulatory Visit: Payer: Self-pay | Admitting: Family Medicine

## 2018-05-24 DIAGNOSIS — Z1231 Encounter for screening mammogram for malignant neoplasm of breast: Secondary | ICD-10-CM

## 2018-07-23 ENCOUNTER — Ambulatory Visit
Admission: RE | Admit: 2018-07-23 | Discharge: 2018-07-23 | Disposition: A | Discharge status: Home / Self Care | Payer: No Typology Code available for payment source | Source: Ambulatory Visit | Attending: Family Medicine | Admitting: Family Medicine

## 2018-07-23 ENCOUNTER — Other Ambulatory Visit: Payer: Self-pay

## 2018-07-23 DIAGNOSIS — Z1231 Encounter for screening mammogram for malignant neoplasm of breast: Secondary | ICD-10-CM

## 2018-10-26 ENCOUNTER — Other Ambulatory Visit: Payer: Self-pay

## 2018-10-26 ENCOUNTER — Ambulatory Visit (INDEPENDENT_AMBULATORY_CARE_PROVIDER_SITE_OTHER): Payer: Self-pay | Admitting: *Deleted

## 2018-10-26 ENCOUNTER — Other Ambulatory Visit: Payer: Self-pay | Admitting: Family Medicine

## 2018-10-26 DIAGNOSIS — E538 Deficiency of other specified B group vitamins: Secondary | ICD-10-CM

## 2018-10-26 DIAGNOSIS — D509 Iron deficiency anemia, unspecified: Secondary | ICD-10-CM

## 2018-10-26 NOTE — Progress Notes (Signed)
Pt is here for a b12 injection today and an "Iron check".  Last B12 was in March 2020 and she is requesting iron check because she "feels tired all the time".  Pt advised that some of her fatigue could be from lack of B12 in 6 months.  Spoke with Dr. Andria Frames who give verbal for B12 and a CBC.  Date of last office visit that b12 was discussed 12/08/17  Last injection was 04/29/18  Injection given in left deltoid, pt tolerated well and will stop at front desk to schedule next appt. Christen Bame, CMA

## 2018-10-26 NOTE — Progress Notes (Signed)
Noted and agree. 

## 2018-10-27 DIAGNOSIS — D509 Iron deficiency anemia, unspecified: Secondary | ICD-10-CM | POA: Insufficient documentation

## 2018-10-27 LAB — CBC
Hematocrit: 30 % — ABNORMAL LOW (ref 34.0–46.6)
Hemoglobin: 9.4 g/dL — ABNORMAL LOW (ref 11.1–15.9)
MCH: 26 pg — ABNORMAL LOW (ref 26.6–33.0)
MCHC: 31.3 g/dL — ABNORMAL LOW (ref 31.5–35.7)
MCV: 83 fL (ref 79–97)
Platelets: 316 10*3/uL (ref 150–450)
RBC: 3.62 x10E6/uL — ABNORMAL LOW (ref 3.77–5.28)
RDW: 16.1 % — ABNORMAL HIGH (ref 11.7–15.4)
WBC: 5.7 10*3/uL (ref 3.4–10.8)

## 2018-10-27 NOTE — Assessment & Plan Note (Signed)
Now post menopausal.  Up to date on colon cancer screen.  Has not been taking iron.  Told to restart.  Will discuss further during her upcoming physical.

## 2018-11-03 ENCOUNTER — Encounter: Payer: Self-pay | Admitting: Family Medicine

## 2018-11-03 ENCOUNTER — Other Ambulatory Visit: Payer: Self-pay

## 2018-11-03 ENCOUNTER — Ambulatory Visit (INDEPENDENT_AMBULATORY_CARE_PROVIDER_SITE_OTHER): Payer: Self-pay | Admitting: Family Medicine

## 2018-11-03 ENCOUNTER — Other Ambulatory Visit (HOSPITAL_COMMUNITY)
Admission: RE | Admit: 2018-11-03 | Discharge: 2018-11-03 | Disposition: A | Discharge status: Home / Self Care | Payer: No Typology Code available for payment source | Source: Ambulatory Visit | Attending: Family Medicine | Admitting: Family Medicine

## 2018-11-03 VITALS — BP 126/68 | HR 88 | Ht 65.0 in | Wt 182.0 lb

## 2018-11-03 DIAGNOSIS — Z124 Encounter for screening for malignant neoplasm of cervix: Secondary | ICD-10-CM

## 2018-11-03 DIAGNOSIS — D126 Benign neoplasm of colon, unspecified: Secondary | ICD-10-CM

## 2018-11-03 DIAGNOSIS — I10 Essential (primary) hypertension: Secondary | ICD-10-CM

## 2018-11-03 DIAGNOSIS — Z23 Encounter for immunization: Secondary | ICD-10-CM

## 2018-11-03 DIAGNOSIS — Z0000 Encounter for general adult medical examination without abnormal findings: Secondary | ICD-10-CM

## 2018-11-03 DIAGNOSIS — R1030 Lower abdominal pain, unspecified: Secondary | ICD-10-CM

## 2018-11-03 DIAGNOSIS — D509 Iron deficiency anemia, unspecified: Secondary | ICD-10-CM

## 2018-11-03 DIAGNOSIS — R87619 Unspecified abnormal cytological findings in specimens from cervix uteri: Secondary | ICD-10-CM

## 2018-11-03 NOTE — Patient Instructions (Signed)
Things look good. Please do get a colonoscopy. I will call with lab test results.  See me in one year - sooner if problems.

## 2018-11-04 ENCOUNTER — Encounter: Payer: Self-pay | Admitting: Family Medicine

## 2018-11-04 DIAGNOSIS — Z0000 Encounter for general adult medical examination without abnormal findings: Secondary | ICD-10-CM | POA: Insufficient documentation

## 2018-11-04 LAB — CMP14+EGFR
ALT: 9 IU/L (ref 0–32)
AST: 14 IU/L (ref 0–40)
Albumin/Globulin Ratio: 1.5 (ref 1.2–2.2)
Albumin: 4 g/dL (ref 3.8–4.9)
Alkaline Phosphatase: 83 IU/L (ref 39–117)
BUN/Creatinine Ratio: 9 (ref 9–23)
BUN: 7 mg/dL (ref 6–24)
Bilirubin Total: 0.4 mg/dL (ref 0.0–1.2)
CO2: 27 mmol/L (ref 20–29)
Calcium: 9.6 mg/dL (ref 8.7–10.2)
Chloride: 108 mmol/L — ABNORMAL HIGH (ref 96–106)
Creatinine, Ser: 0.77 mg/dL (ref 0.57–1.00)
GFR calc Af Amer: 98 mL/min/{1.73_m2} (ref >59)
GFR calc non Af Amer: 85 mL/min/{1.73_m2} (ref >59)
Globulin, Total: 2.6 g/dL (ref 1.5–4.5)
Glucose: 113 mg/dL — ABNORMAL HIGH (ref 65–99)
Potassium: 4.1 mmol/L (ref 3.5–5.2)
Sodium: 146 mmol/L — ABNORMAL HIGH (ref 134–144)
Total Protein: 6.6 g/dL (ref 6.0–8.5)

## 2018-11-04 LAB — IRON AND TIBC
Iron Saturation: 10 % — ABNORMAL LOW (ref 15–55)
Iron: 35 ug/dL (ref 27–159)
Total Iron Binding Capacity: 357 ug/dL (ref 250–450)
UIBC: 322 ug/dL (ref 131–425)

## 2018-11-04 LAB — LIPID PANEL
Chol/HDL Ratio: 3.3 ratio (ref 0.0–4.4)
Cholesterol, Total: 132 mg/dL (ref 100–199)
HDL: 40 mg/dL (ref >39)
LDL Chol Calc (NIH): 78 mg/dL (ref 0–99)
Triglycerides: 67 mg/dL (ref 0–149)
VLDL Cholesterol Cal: 14 mg/dL (ref 5–40)

## 2018-11-04 NOTE — Assessment & Plan Note (Signed)
Stay on iron.  Get colonoscopy.

## 2018-11-04 NOTE — Assessment & Plan Note (Signed)
Encouraged to get colonoscopy.

## 2018-11-04 NOTE — Progress Notes (Signed)
Established Patient Office Visit  Subjective:  Patient ID: Meghan [name], female    DOB: 01/28/1961  Age: 58 y.o. MRN: [id number]  CC:  Chief Complaint  Patient presents with  . Annual Exam    HPI Meghan [name] presents for annual exam and a few minor complaints.  Issues: 1. Anemia.  Receiving B12.  Now microcytic.  Currently taking iron.  Need to confirm iron deficiency.  No menses for years, S/P remote uterine artery embolization for fibroids.  See GI below. 2. Difficulty passing stools.  Sometimes feels blocked.  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  Unexplained iron deficiency anemia would be another reason for colonoscopy. 3. Some weight gain.  Recognizes that she has not been as active with COVID.    Diet and exercise: generally healthy.  A little less so with COVID.  She is committed to resuming exercise.  HPDP Needs pap and flu shot.  And lipids.   History reviewed  Past Medical History:  Diagnosis Date  . Angio-edema   . Asthma   . Urticaria     Past Surgical History:  Procedure Laterality Date  . ADENOIDECTOMY    . TONSILLECTOMY  1987  . TUBAL LIGATION  1988  . UTERINE ARTERY EMBOLIZATION  2000   for fibroids     Family History  Problem Relation Age of Onset  . Hypertension Mother   . Kidney disease Mother   . Cholecystitis Mother   . Hypertension Father   . Diabetes Father   . Kidney disease Daughter     Social History   Socioeconomic History  . Marital status: Divorced    Spouse name: Not on file  . Number of children: Not on file  . Years of education: Not on file  . Highest education level: Not on file  Occupational History  . Not on file  Social Needs  . Financial resource strain: Not on file  . Food insecurity    Worry: Not on file    Inability: Not on file  . Transportation needs    Medical: Not on file    Non-medical: Not on file  Tobacco Use  . Smoking status: Former Smoker    Quit date:  09/13/1995    Years since quitting: 23.1  . Smokeless tobacco: Never Used  Substance and Sexual Activity  . Alcohol use: Yes    Alcohol/week: 0.5 standard drinks    Types: 1 drink(s) per week  . Drug use: No  . Sexual activity: Not Currently  Lifestyle  . Physical activity    Days per week: Not on file    Minutes per session: Not on file  . Stress: Not on file  Relationships  . Social Herbalist on phone: Not on file    Gets together: Not on file    Attends religious service: Not on file    Active member of club or organization: Not on file    Attends meetings of clubs or organizations: Not on file    Relationship status: Not on file  . Intimate partner violence    Fear of current or ex partner: Not on file    Emotionally abused: Not on file    Physically abused: Not on file    Forced sexual activity: Not on file  Other Topics Concern  . Not on file  Social History Narrative  . Not on file    Outpatient Medications Prior to Visit  Medication Sig Dispense Refill  . albuterol (PROVENTIL HFA;VENTOLIN HFA) 108 (90 Base) MCG/ACT inhaler Inhale 2 puffs into the lungs every 6 (six) hours as needed for wheezing or shortness of breath. 1 Inhaler 0  . cetirizine (ZYRTEC) 10 MG tablet Take 10 mg by mouth daily.    Marland Kitchen EPINEPHrine 0.15 MG/0.15ML IJ injection Inject 0.15 mLs (0.15 mg total) into the muscle as needed for anaphylaxis. 1 Device 1  . triamcinolone cream (KENALOG) 0.5 % Apply 1 application topically 3 (three) times daily. 454 g 1  . metroNIDAZOLE (FLAGYL) 500 MG tablet Take 1 tablet (500 mg total) by mouth 2 (two) times daily. 12 tablet 0   Facility-Administered Medications Prior to Visit  Medication Dose Route Frequency Provider Last Rate Last Dose  . cyanocobalamin ((VITAMIN B-12)) injection 1,000 mcg  1,000 mcg Intramuscular Q30 days [name], MD   1,000 mcg at 10/26/18 1433  . cyanocobalamin ((VITAMIN B-12)) injection 1,000 mcg  1,000 mcg Intramuscular  Once [name], MD        Allergies  Allergen Reactions  . Codeine   . Latex     Per allergist  . Lidocaine     Seen by allergist.  Avoid lidocaine.  OK to use mepivacaine   . Metronidazole   . Morphine   . Oxycodone-Acetaminophen   . Sulfonamide Derivatives     ROS Review of Systems   Denies CP, SOB, change in appetite or bladder.  No worrisome skin lesions.  No headaches. Objective:    Physical Exam  BP 126/68   Pulse 88   Ht 5' 5" (1.651 m)   Wt 182 lb (82.6 kg)   SpO2 98%   BMI 30.29 kg/m  Wt Readings from Last 3 Encounters:  11/03/18 182 lb (82.6 kg)  02/05/18 177 lb 12.8 oz (80.6 kg)  02/04/18 176 lb 6.4 oz (80 kg)   HEENT WNL Neck supple without mass Lungs clear Cardiac RRR without m or g Abd benign Pelvic.  WNL. Uterus not enlarged.  Ovaries not palpable Pap taken Ext WNL Neuro WNL  Health Maintenance Due  Topic Date Due  . TETANUS/TDAP  06/29/2018  . PAP SMEAR-Modifier  10/03/2018    There are no preventive care reminders to display for this patient.  Lab Results  Component Value Date   TSH 1.210 03/04/2017   Lab Results  Component Value Date   WBC 5.7 10/26/2018   HGB 9.4 (L) 10/26/2018   HCT 30.0 (L) 10/26/2018   MCV 83 10/26/2018   PLT 316 10/26/2018   Lab Results  Component Value Date   NA 146 (H) 11/03/2018   K 4.1 11/03/2018   CO2 27 11/03/2018   GLUCOSE 113 (H) 11/03/2018   BUN 7 11/03/2018   CREATININE 0.77 11/03/2018   BILITOT 0.4 11/03/2018   ALKPHOS 83 11/03/2018   AST 14 11/03/2018   ALT 9 11/03/2018   PROT 6.6 11/03/2018   ALBUMIN 4.0 11/03/2018   CALCIUM 9.6 11/03/2018   Lab Results  Component Value Date   CHOL 132 11/03/2018   Lab Results  Component Value Date   HDL 40 11/03/2018   Lab Results  Component Value Date   LDLCALC 63 01/09/2016   Lab Results  Component Value Date   TRIG 67 11/03/2018   Lab Results  Component Value Date   CHOLHDL 3.3 11/03/2018   No results found for: HGBA1C     Assessment & Plan:   Problem List Items Addressed This Visit  RESOLVED: HYPERTENSION, BENIGN ESSENTIAL   Relevant Orders   Lipid panel (Completed)   Iron deficiency anemia   Relevant Orders   Iron and TIBC (Completed)   Lower abdominal pain   Relevant Orders   CMP14+EGFR (Completed)    Other Visit Diagnoses    Screening for malignant neoplasm of cervix    -  Primary   Relevant Orders   Cytology - PAP([place])   Need for immunization against influenza       Relevant Orders   Flu Vaccine QUAD 36+ mos IM (Completed)      No orders of the defined types were placed in this encounter.   Follow-up: No follow-ups on file.    [name], MD

## 2018-11-04 NOTE — Assessment & Plan Note (Signed)
Healthy female without at risk behaviors who is generally up to date on prevention strategies.

## 2018-11-09 LAB — CYTOLOGY - PAP
Diagnosis: NEGATIVE
High risk HPV: POSITIVE — AB
Molecular Disclaimer: 56
Molecular Disclaimer: NORMAL

## 2018-11-10 DIAGNOSIS — R87619 Unspecified abnormal cytological findings in specimens from cervix uteri: Secondary | ICD-10-CM | POA: Insufficient documentation

## 2018-11-30 ENCOUNTER — Ambulatory Visit (INDEPENDENT_AMBULATORY_CARE_PROVIDER_SITE_OTHER): Payer: Self-pay | Admitting: *Deleted

## 2018-11-30 ENCOUNTER — Other Ambulatory Visit: Payer: Self-pay

## 2018-11-30 DIAGNOSIS — E538 Deficiency of other specified B group vitamins: Secondary | ICD-10-CM

## 2018-11-30 NOTE — Progress Notes (Signed)
Pt is here for a b12 injection today.    Date of last office visit that b12 was discussed 11/03/18  Last injection was 10/26/18  Injection given in right deltoid, pt tolerated well and will stop at front desk to schedule next appt. Christen Bame, CMA

## 2018-12-31 ENCOUNTER — Ambulatory Visit: Payer: No Typology Code available for payment source

## 2018-12-31 ENCOUNTER — Ambulatory Visit (INDEPENDENT_AMBULATORY_CARE_PROVIDER_SITE_OTHER): Payer: Self-pay

## 2018-12-31 ENCOUNTER — Other Ambulatory Visit: Payer: Self-pay

## 2018-12-31 DIAGNOSIS — E538 Deficiency of other specified B group vitamins: Secondary | ICD-10-CM

## 2018-12-31 NOTE — Progress Notes (Signed)
Pt is here for a b12 injection today.    Date of last office visit that b12 was discussed 11/03/18  Last injection was 11/30/2018  Injection given in left deltoid, pt tolerated well and will stop at front desk to schedule next appt. Meghan [name]

## 2019-01-21 ENCOUNTER — Other Ambulatory Visit: Payer: Self-pay

## 2019-01-21 ENCOUNTER — Telehealth (INDEPENDENT_AMBULATORY_CARE_PROVIDER_SITE_OTHER): Payer: Self-pay | Admitting: Family Medicine

## 2019-01-21 DIAGNOSIS — Z20828 Contact with and (suspected) exposure to other viral communicable diseases: Secondary | ICD-10-CM

## 2019-01-21 DIAGNOSIS — Z20822 Contact with and (suspected) exposure to covid-19: Secondary | ICD-10-CM | POA: Insufficient documentation

## 2019-01-21 NOTE — Assessment & Plan Note (Signed)
Close exposure to COVID-19 per CDC definition of close exposure.  Currently asymptomatic and doing well but given close exposure, will test.    -Counseled on wearing a mask, washing hands and avoiding social gatherings  -ED precautions discussed and patient expressed good understanding -Patient instructed to avoid others until test results or she meets criteria for ending isolation after any suspected COVID, which are: -3 days with no fever and -Respiratory symptoms have improved (e.g. cough, shortness of breath) or -10 days since symptoms first appeared

## 2019-01-21 NOTE — Progress Notes (Signed)
[place] Telemedicine Visit  Patient consented to have virtual visit. Method of visit: Video was attempted, but technology challenges prevented patient from using video, so visit was conducted via telephone.  Encounter participants: Patient: Meghan [name] - located at home Provider: Rory Percy - located at [facility] Others (if applicable): n/a  Chief Complaint: exposure to COVID  HPI:  Patient reports close exposure to [place].  About a week ago she was with her friend and friend's goddaughter while running errands.  Was notified on Wednesday her friend tested positive for COVID.  Reports her friend did not have symptoms but was subsequently admitted to the hospital, she is unclear regarding her friends symptoms or severity.  Patient states she wore her mask when they went into stores and were walking around but reports when they were in the car no one was wearing a mask.  She denies any current symptoms including congestion, sore throat, runny nose, chest pain, shortness of breath, nausea, vomiting, fevers.  She has been isolating herself since she found out her friend tested positive for Covid.  ROS: per HPI  Pertinent PMHx: GERD, H/o malignant hyperthermia, iron deficiency anemia  Exam:  Respiratory: Speaks in full sentences, no respiratory distress, no coughing.  Assessment/Plan:  Exposure to COVID-19 virus Close exposure to COVID-19 per CDC definition of close exposure.  Currently asymptomatic and doing well but given close exposure, will test.    -Counseled on wearing a mask, washing hands and avoiding social gatherings  -ED precautions discussed and patient expressed good understanding -Patient instructed to avoid others until test results or she meets criteria for ending isolation after any suspected COVID, which are: -3 days with no fever and -Respiratory symptoms have improved (e.g. cough, shortness of breath) or -10 days since symptoms first appeared     Time spent during visit with patient: 14 minutes

## 2019-02-18 ENCOUNTER — Other Ambulatory Visit: Payer: Self-pay | Admitting: Gastroenterology

## 2019-02-21 ENCOUNTER — Encounter (HOSPITAL_COMMUNITY): Payer: Self-pay | Admitting: Gastroenterology

## 2019-02-21 NOTE — Progress Notes (Signed)
Patient stated that she has experienced [facility] in [place].  We have no records of this here in our system. I spoke with Ladean Raya about this concern.

## 2019-02-22 ENCOUNTER — Other Ambulatory Visit (HOSPITAL_COMMUNITY)
Admission: RE | Admit: 2019-02-22 | Discharge: 2019-02-22 | Disposition: A | Discharge status: Home / Self Care | Payer: 59 | Source: Ambulatory Visit | Attending: Gastroenterology | Admitting: Gastroenterology

## 2019-02-22 DIAGNOSIS — Z20822 Contact with and (suspected) exposure to covid-19: Secondary | ICD-10-CM | POA: Diagnosis not present

## 2019-02-22 DIAGNOSIS — Z01812 Encounter for preprocedural laboratory examination: Secondary | ICD-10-CM | POA: Diagnosis not present

## 2019-02-23 LAB — NOVEL CORONAVIRUS, NAA (HOSP ORDER, SEND-OUT TO REF LAB; TAT 18-24 HRS): SARS-CoV-2, NAA: NOT DETECTED

## 2019-02-25 ENCOUNTER — Encounter (HOSPITAL_COMMUNITY)
Admission: RE | Disposition: A | Discharge status: Home / Self Care | Payer: Self-pay | Source: Home / Self Care | Attending: Gastroenterology

## 2019-02-25 ENCOUNTER — Encounter (HOSPITAL_COMMUNITY): Payer: Self-pay | Admitting: Gastroenterology

## 2019-02-25 ENCOUNTER — Ambulatory Visit (HOSPITAL_COMMUNITY): Payer: 59 | Admitting: Physician Assistant

## 2019-02-25 ENCOUNTER — Ambulatory Visit (HOSPITAL_COMMUNITY)
Admission: RE | Admit: 2019-02-25 | Discharge: 2019-02-25 | Disposition: A | Discharge status: Home / Self Care | Payer: 59 | Attending: Gastroenterology | Admitting: Gastroenterology

## 2019-02-25 ENCOUNTER — Other Ambulatory Visit: Payer: Self-pay

## 2019-02-25 DIAGNOSIS — D509 Iron deficiency anemia, unspecified: Secondary | ICD-10-CM | POA: Diagnosis not present

## 2019-02-25 DIAGNOSIS — K317 Polyp of stomach and duodenum: Secondary | ICD-10-CM | POA: Insufficient documentation

## 2019-02-25 DIAGNOSIS — Z87891 Personal history of nicotine dependence: Secondary | ICD-10-CM | POA: Insufficient documentation

## 2019-02-25 DIAGNOSIS — Z79899 Other long term (current) drug therapy: Secondary | ICD-10-CM | POA: Insufficient documentation

## 2019-02-25 DIAGNOSIS — K3189 Other diseases of stomach and duodenum: Secondary | ICD-10-CM | POA: Insufficient documentation

## 2019-02-25 DIAGNOSIS — J45909 Unspecified asthma, uncomplicated: Secondary | ICD-10-CM | POA: Diagnosis not present

## 2019-02-25 DIAGNOSIS — Z800 Family history of malignant neoplasm of digestive organs: Secondary | ICD-10-CM | POA: Insufficient documentation

## 2019-02-25 HISTORY — DX: Malignant hyperthermia due to anesthesia, initial encounter: T88.3XXA

## 2019-02-25 HISTORY — PX: ESOPHAGOGASTRODUODENOSCOPY (EGD) WITH PROPOFOL: SHX5813

## 2019-02-25 HISTORY — PX: POLYPECTOMY: SHX5525

## 2019-02-25 HISTORY — PX: HEMOSTASIS CLIP PLACEMENT: SHX6857

## 2019-02-25 SURGERY — ESOPHAGOGASTRODUODENOSCOPY (EGD) WITH PROPOFOL
Anesthesia: Monitor Anesthesia Care

## 2019-02-25 MED ORDER — PROPOFOL 500 MG/50ML IV EMUL
INTRAVENOUS | Status: DC | PRN
  Administered 2019-02-25: 10 mg via INTRAVENOUS
  Administered 2019-02-25: 50 mg via INTRAVENOUS

## 2019-02-25 MED ORDER — LACTATED RINGERS IV SOLN: INTRAVENOUS | Status: DC

## 2019-02-25 MED ORDER — PROPOFOL 500 MG/50ML IV EMUL
INTRAVENOUS | Status: DC | PRN
  Administered 2019-02-25: 150 ug/kg/min via INTRAVENOUS

## 2019-02-25 MED ORDER — PROPOFOL 500 MG/50ML IV EMUL
INTRAVENOUS | Status: AC
  Filled 2019-02-25: qty 50

## 2019-02-25 MED ORDER — ONDANSETRON HCL 4 MG/2ML IJ SOLN
INTRAMUSCULAR | Status: DC | PRN
  Administered 2019-02-25: 4 mg via INTRAVENOUS

## 2019-02-25 SURGICAL SUPPLY — 15 items

## 2019-02-25 NOTE — Anesthesia Preprocedure Evaluation (Addendum)
Anesthesia Evaluation  Patient identified by MRN, date of birth, ID band Patient awake    Reviewed: Allergy & Precautions, NPO status , Patient's Chart, lab work & pertinent test results  History of Anesthesia Complications (+) MALIGNANT HYPERTHERMIA  Airway Mallampati: II  TM Distance: >3 FB Neck ROM: Full    Dental no notable dental hx.    Pulmonary asthma , former smoker,    Pulmonary exam normal breath sounds clear to auscultation       Cardiovascular negative cardio ROS Normal cardiovascular exam Rhythm:Regular Rate:Normal     Neuro/Psych negative neurological ROS  negative psych ROS   GI/Hepatic negative GI ROS, Neg liver ROS,   Endo/Other  negative endocrine ROS  Renal/GU negative Renal ROS  negative genitourinary   Musculoskeletal negative musculoskeletal ROS (+)   Abdominal   Peds negative pediatric ROS (+)  Hematology negative hematology ROS (+)   Anesthesia Other Findings   Reproductive/Obstetrics negative OB ROS                             Anesthesia Physical Anesthesia Plan  ASA: II  Anesthesia Plan: MAC   Post-op Pain Management:    Induction: Intravenous  PONV Risk Score and Plan: 0  Airway Management Planned: Simple Face Mask  Additional Equipment:   Intra-op Plan:   Post-operative Plan:   Informed Consent: I have reviewed the patients History and Physical, chart, labs and discussed the procedure including the risks, benefits and alternatives for the proposed anesthesia with the patient or authorized representative who has indicated his/her understanding and acceptance.     Dental advisory given  Plan Discussed with: CRNA and Surgeon  Anesthesia Plan Comments:         Anesthesia Quick Evaluation

## 2019-02-25 NOTE — Op Note (Addendum)
[facility] Patient Name: Meghan [name] Procedure Date: 02/25/2019 MRN: [id number] Attending MD: Carol Ada , MD Date of Birth: 1960-04-04 CSN: [id number] Age: 59 Admit Type: Outpatient Procedure:                Upper GI endoscopy Indications:              Therapeutic procedure Providers:                Carol Ada, MD, Carmie End, RN, Lina Sar, Technician, Corie Chiquito, Technician, Rosario Adie, CRNA Referring MD:              Medicines:                 Complications:            No immediate complications. Estimated Blood Loss:     Estimated blood loss: none. Procedure:                Pre-Anesthesia Assessment:                           - Prior to the procedure, a History and Physical                            was performed, and patient medications and                            allergies were reviewed. The patient's tolerance of                            previous anesthesia was also reviewed. The risks                            and benefits of the procedure and the sedation                            options and risks were discussed with the patient.                            All questions were answered, and informed consent                            was obtained. Prior Anticoagulants: The patient has                            taken no previous anticoagulant or antiplatelet                            agents. ASA Grade Assessment: II - A patient with                            mild systemic disease. After  reviewing the risks                            and benefits, the patient was deemed in                            satisfactory condition to undergo the procedure.                           - Sedation was administered by an anesthesia                            professional. Deep sedation was attained.                           After obtaining informed consent, the endoscope was                     passed under direct vision. Throughout the                            procedure, the patient's blood pressure, pulse, and                            oxygen saturations were monitored continuously. The                            [id number]-[id number] Olympus gastroscope was                            introduced through the mouth, and advanced to the                            second part of duodenum. The upper GI endoscopy was                            accomplished without difficulty. The patient                            tolerated the procedure well. Scope In: Scope Out: Findings:      The esophagus was normal.      Four 4 to 25 mm pedunculated and sessile polyps with no bleeding and no       stigmata of recent bleeding were found in the cardia, in the gastric       fundus and in the gastric body. The polyp was removed with a hot snare.       Resection and retrieval were complete.      The examined duodenum was normal.      The two larger pedunculated polyps were removed with a hot snare. The       largest polyp was 1 cm x 2.5 cm in dimension. Because of the larger       resections, a total of 3 hemoclips were placed at the bases as there was       some mild oozing. The two smaller 4 mm polyps were removed with a cold  snare. Impression:               - Normal esophagus.                           - Four gastric polyps. Resected and retrieved.                           - Normal examined duodenum. Moderate Sedation:      Not Applicable - Patient had care per Anesthesia. Recommendation:           - Patient has a contact number available for                            emergencies. The signs and symptoms of potential                            delayed complications were discussed with the                            patient. Return to normal activities tomorrow.                            Written discharge instructions were provided to the                             patient.                           - Resume previous diet.                           - Continue present medications.                           - Await pathology results.                           - Repeat upper endoscopy in 1 year for surveillance. Procedure Code(s):        --- Professional ---                           [phone], Esophagogastroduodenoscopy, flexible,                            transoral; with removal of tumor(s), polyp(s), or                            other lesion(s) by snare technique Diagnosis Code(s):        --- Professional ---                           K31.7, Polyp of stomach and duodenum CPT copyright 2019 American Medical Association. All rights reserved. The codes documented in this report are preliminary and upon coder review may  be revised to meet current compliance requirements. Carol Ada, MD Carol Ada, MD 02/25/2019 9:42:20 AM This report has been signed electronically.  Number of Addenda: 0 

## 2019-02-25 NOTE — Discharge Instructions (Signed)

## 2019-02-25 NOTE — H&P (Signed)
  Meghan [name] HPI: This 59 year old black female presents to the office for colorectal cancer screening. She used to have 2-3 BM's per day. Over the last 1-2 years she has had problems with constipation. She can go up to 2-3 days without a BM. There is no obvious blood or mucus in the stool. She has a lot of gas and bloating. She has a past history of iron deficiency anemia. She is taking an over the counter Iron supplement and is also getting Vitamin B12 injections every month. She has a good appetite and she has gained 15 over the past 6-7 months. She denies having any complaints of abdominal pain, nausea, vomiting, acid reflux, dysphagia or odynophagia. She denies having a family history of celiac sprue or IBD. Her brother was diagnosed with colon cancer at the age 37 and died at the age 24 [of complications of colon cancer]. Her last colonoscopy done on 09/02/2013 fragments of a tubular adenoma were removed from the ascending colon.    Past Medical History:  Diagnosis Date  . Angio-edema   . Asthma   . Lichen planus   . Malignant hyperthermia    1988, 1987  . Urticaria     Past Surgical History:  Procedure Laterality Date  . ADENOIDECTOMY    . TONSILLECTOMY  1987  . TUBAL LIGATION  1988  . UTERINE ARTERY EMBOLIZATION  2000   for fibroids     Family History  Problem Relation Age of Onset  . Hypertension Mother   . Kidney disease Mother   . Cholecystitis Mother   . Hypertension Father   . Diabetes Father   . Kidney disease Daughter     Social History:  reports that she quit smoking about 23 years ago. She has never used smokeless tobacco. She reports current alcohol use of about 0.5 standard drinks of alcohol per week. She reports that she does not use drugs.  Allergies:  Allergies  Allergen Reactions  . Shellfish Allergy Itching and Swelling  . Codeine Hives and Itching  . Latex Swelling    Per allergist  . Lidocaine Swelling    Disorientation, shocks system  OK to  use mepivacaine   . Metronidazole Hives and Itching  . Morphine     Shuts system down  . Other     malignant hyperthermia  . Oxycodone-Acetaminophen     Shuts system down  . Sulfonamide Derivatives Hives and Itching    Medications:  Scheduled:  Continuous: . lactated ringers 20 mL/hr at 02/25/19 0801    No results found for this or any previous visit (from the past 24 hour(s)).   No results found.  ROS:  As stated above in the HPI otherwise negative.  Blood pressure (!) 162/90, pulse 97, temperature 99 F (37.2 C), temperature source Oral, resp. rate 15, height 5\' 6"  (1.676 m), weight 74.8 kg, SpO2 98 %.    PE: Gen: NAD, Alert and Oriented HEENT:  [place]/AT, EOMI Neck: Supple, no LAD Lungs: CTA Bilaterally CV: RRR without M/G/R ABM: Soft, NTND, +BS Ext: No C/C/E  Assessment/Plan: 1) Gastric polyps - EGD with polypectomy.  Esten Dollar D 02/25/2019, 8:54 AM

## 2019-02-25 NOTE — Transfer of Care (Signed)
Immediate Anesthesia Transfer of Care Note  Patient: Meghan [name]  Procedure(s) Performed: ESOPHAGOGASTRODUODENOSCOPY (EGD) WITH PROPOFOL (N/A ) POLYPECTOMY HEMOSTASIS CLIP PLACEMENT  Patient Location: PACU  Anesthesia Type:MAC  Level of Consciousness: drowsy, patient cooperative and responds to stimulation  Airway & Oxygen Therapy: Patient Spontanous Breathing and Patient connected to face mask oxygen  Post-op Assessment: Report given to RN and Post -op Vital signs reviewed and stable  Post vital signs: Reviewed and stable  Last Vitals:  Vitals Value Taken Time  BP 140/77 02/25/19 0944  Temp    Pulse 68 02/25/19 0945  Resp 19 02/25/19 0945  SpO2 100 % 02/25/19 0945  Vitals shown include unvalidated device data.  Last Pain:  Vitals:   02/25/19 0752  TempSrc: Oral  PainSc: 0-No pain         Complications: No apparent anesthesia complications

## 2019-02-25 NOTE — Anesthesia Postprocedure Evaluation (Signed)
Anesthesia Post Note  Patient: ADLEY CASTELLO  Procedure(s) Performed: ESOPHAGOGASTRODUODENOSCOPY (EGD) WITH PROPOFOL (N/A ) POLYPECTOMY HEMOSTASIS CLIP PLACEMENT     Patient location during evaluation: PACU Anesthesia Type: MAC Level of consciousness: awake and alert Pain management: pain level controlled Vital Signs Assessment: post-procedure vital signs reviewed and stable Respiratory status: spontaneous breathing, nonlabored ventilation, respiratory function stable and patient connected to nasal cannula oxygen Cardiovascular status: stable and blood pressure returned to baseline Postop Assessment: no apparent nausea or vomiting Anesthetic complications: no    Last Vitals:  Vitals:   02/25/19 0752 02/25/19 0945  BP: (!) 162/90 140/77  Pulse: 97 65  Resp: 15 18  Temp: 37.2 C (!) 36.3 C  SpO2: 98% 100%    Last Pain:  Vitals:   02/25/19 0945  TempSrc: Temporal  PainSc: 0-No pain                 Olvin Rohr S

## 2019-02-25 NOTE — Anesthesia Procedure Notes (Signed)
Date/Time: 02/25/2019 8:57 AM Performed by: Glory Buff, CRNA Oxygen Delivery Method: Simple face mask

## 2019-02-28 ENCOUNTER — Encounter: Payer: Self-pay | Admitting: *Deleted

## 2019-02-28 LAB — SURGICAL PATHOLOGY

## 2019-03-03 ENCOUNTER — Other Ambulatory Visit: Payer: Self-pay

## 2019-03-03 ENCOUNTER — Ambulatory Visit (INDEPENDENT_AMBULATORY_CARE_PROVIDER_SITE_OTHER): Payer: 59 | Admitting: *Deleted

## 2019-03-03 DIAGNOSIS — E538 Deficiency of other specified B group vitamins: Secondary | ICD-10-CM | POA: Diagnosis not present

## 2019-03-03 NOTE — Progress Notes (Signed)
Pt is here for a b12 injection today.    Date of last office visit that b12 was discussed 11/03/18  Last injection was 12/31/18.  Pt missed 01/30/19 due to exposure to covid 19.  Injection given in left deltoid, pt tolerated well and will stop at front desk to schedule next appt. Christen Bame, CMA

## 2019-03-28 ENCOUNTER — Ambulatory Visit (INDEPENDENT_AMBULATORY_CARE_PROVIDER_SITE_OTHER): Payer: 59 | Admitting: Family Medicine

## 2019-03-28 ENCOUNTER — Other Ambulatory Visit: Payer: Self-pay

## 2019-03-28 ENCOUNTER — Encounter: Payer: Self-pay | Admitting: Family Medicine

## 2019-03-28 DIAGNOSIS — D126 Benign neoplasm of colon, unspecified: Secondary | ICD-10-CM

## 2019-03-28 DIAGNOSIS — D500 Iron deficiency anemia secondary to blood loss (chronic): Secondary | ICD-10-CM | POA: Diagnosis not present

## 2019-03-28 DIAGNOSIS — E538 Deficiency of other specified B group vitamins: Secondary | ICD-10-CM

## 2019-03-28 DIAGNOSIS — R87618 Other abnormal cytological findings on specimens from cervix uteri: Secondary | ICD-10-CM | POA: Diagnosis not present

## 2019-03-28 DIAGNOSIS — R5383 Other fatigue: Secondary | ICD-10-CM | POA: Diagnosis not present

## 2019-03-28 MED ORDER — CYANOCOBALAMIN 1000 MCG/ML IJ SOLN
1000.0000 ug | Freq: Once | INTRAMUSCULAR | Status: AC
  Administered 2019-03-28: 11:00:00 1000 ug via INTRAMUSCULAR

## 2019-03-28 NOTE — Progress Notes (Signed)
   CHIEF COMPLAINT / HPI:  Fatigue, lack of energy.  1. Patient had colonoscopy and EGD.  Had colonic polyps (I cannot see path report) and gastric polyps (metaplastic polyps.)  Still fatigued.  Wonders if she is anemic and needs more iron or B12.  No chest pain, SOB, gross bleeding or focal weakness.  This is generalized weakness.    2. Has gained wt with COVID.  Not exercising as much.  Denies depressive sx.    3. 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  Specifically, what did it mean about the fidelity of her current fiance   OBJECTIVE: BP (!) 132/58   Pulse 78   Wt 189 lb (85.7 kg)   SpO2 99%   BMI 30.51 kg/m   Lungs clear Cardiac RRR without m or g Palms and conjunctiva good color Ext no edema.  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  No conclusion could be made about fiance's fidelity. Knows she needs repeat pap next fall.  Fatigue Very non specific sx.  Will check TSH and anemia labs.  Emphasize the importance of exercise.  Polyp of colon, adenomatous I now see in my note that she again had adenomatous polyp.  Q5y colonoscopy.  Vitamin B 12 deficiency Recheck level.  Monthly B12 shots should be adaquate.  Iron deficiency anemia Recheck ferritin.     Meghan Resides, MD [place]

## 2019-03-28 NOTE — Assessment & Plan Note (Signed)
I now see in my note that she again had adenomatous polyp.  Q5y colonoscopy.

## 2019-03-28 NOTE — Progress Notes (Signed)
Pt was seen in clinic today. Pt given B12 injection in Rt Deltoid. Pt tolerated well. Ottis Stain, CMA

## 2019-03-28 NOTE — Assessment & Plan Note (Signed)
Recheck level.  Monthly B12 shots should be adaquate.

## 2019-03-28 NOTE — Assessment & Plan Note (Signed)
Discussed that the HPV infection was of uncertain duration.  No conclusion could be made about fiance's fidelity. Knows she needs repeat pap next fall.

## 2019-03-28 NOTE — Assessment & Plan Note (Signed)
Recheck ferritin. 

## 2019-03-28 NOTE — Patient Instructions (Addendum)
Please ask Dr. Collene Mares to send me a note.  What type of polyps and when should you have the next colonoscopy. Also ask Dr. Collene Mares is you should be on something for stomach acid.  My guess is that you should be on something.   Also I will get some blood work today.  I will call with results.  Be ready to show her the results on MyChart.   Please get back to exercising.  The weight gain and lack of exercise is contributing to the tiredness. You will need another pap smear next fall.

## 2019-03-28 NOTE — Assessment & Plan Note (Signed)
Very non specific sx.  Will check TSH and anemia labs.  Emphasize the importance of exercise.

## 2019-03-29 LAB — CBC
Hematocrit: 31.8 % — ABNORMAL LOW (ref 34.0–46.6)
Hemoglobin: 10.3 g/dL — ABNORMAL LOW (ref 11.1–15.9)
MCH: 27.4 pg (ref 26.6–33.0)
MCHC: 32.4 g/dL (ref 31.5–35.7)
MCV: 85 fL (ref 79–97)
Platelets: 311 10*3/uL (ref 150–450)
RBC: 3.76 x10E6/uL — ABNORMAL LOW (ref 3.77–5.28)
RDW: 16 % — ABNORMAL HIGH (ref 11.7–15.4)
WBC: 5.4 10*3/uL (ref 3.4–10.8)

## 2019-03-29 LAB — FERRITIN: Ferritin: 9 ng/mL — ABNORMAL LOW (ref 15–150)

## 2019-03-29 LAB — TSH: TSH: 1.32 u[IU]/mL (ref 0.450–4.500)

## 2019-03-29 LAB — VITAMIN B12: Vitamin B-12: >2000 pg/mL — ABNORMAL HIGH (ref 232–1245)

## 2019-05-24 ENCOUNTER — Other Ambulatory Visit: Payer: Self-pay

## 2019-05-24 ENCOUNTER — Ambulatory Visit (INDEPENDENT_AMBULATORY_CARE_PROVIDER_SITE_OTHER): Payer: 59

## 2019-05-24 DIAGNOSIS — E538 Deficiency of other specified B group vitamins: Secondary | ICD-10-CM | POA: Diagnosis not present

## 2019-05-25 MED ORDER — CYANOCOBALAMIN 1000 MCG/ML IJ SOLN
1000.0000 ug | Freq: Once | INTRAMUSCULAR | Status: AC
  Administered 2019-05-24: 1000 ug via INTRAMUSCULAR

## 2019-05-25 NOTE — Progress Notes (Signed)
Pt is here for a b12 injection today.    Date of last office visit that b12 was discussed 03/28/2019  Last injection was 03/28/2019  Injection given in left deltoid, pt tolerated well and will stop at front desk to schedule next appt. Talbot Grumbling, RN  *Late documentation: Nurse Visit on 05/24/2019

## 2019-08-16 ENCOUNTER — Telehealth: Payer: Self-pay

## 2019-08-16 NOTE — Telephone Encounter (Signed)
Patient calls nurse line requesting nurse visit for B12 injection. Patient has not had injection since 05/24/19, recommended monthly injections at office visit on 03/28/19. Last B12 level was >2000 on 03/28/19.   Please advise if patient be scheduled as nurse visit or if patient should have OV for further discussion/ repeat lab work.   Forwarding to PCP  Talbot Grumbling, RN

## 2019-08-17 NOTE — Telephone Encounter (Signed)
Patient returns call to nurse line. Patient reports increased fatigue and would like her blood levels checked as soon as possible. Patient scheduled with Dr. Harrel Carina on Friday for follow up evaluation.   To PCP and Dr. Sandi Carne.  Talbot Grumbling, RN

## 2019-08-17 NOTE — Telephone Encounter (Signed)
Called and left message.  Given high B12 level, I do not think she currently needs any injection.  We can address at next office visit.  She will call back if questions.

## 2019-08-19 ENCOUNTER — Other Ambulatory Visit: Payer: Self-pay

## 2019-08-19 ENCOUNTER — Ambulatory Visit (INDEPENDENT_AMBULATORY_CARE_PROVIDER_SITE_OTHER): Payer: 59 | Admitting: Family Medicine

## 2019-08-19 ENCOUNTER — Encounter: Payer: Self-pay | Admitting: Family Medicine

## 2019-08-19 VITALS — BP 122/70 | HR 82 | Ht 66.5 in | Wt 185.5 lb

## 2019-08-19 DIAGNOSIS — Z87892 Personal history of anaphylaxis: Secondary | ICD-10-CM

## 2019-08-19 DIAGNOSIS — M25561 Pain in right knee: Secondary | ICD-10-CM

## 2019-08-19 DIAGNOSIS — R5383 Other fatigue: Secondary | ICD-10-CM

## 2019-08-19 MED ORDER — EPINEPHRINE 0.15 MG/0.15ML IJ SOAJ: 0.1500 mg | INTRAMUSCULAR | 1 refills | Status: DC | PRN

## 2019-08-19 MED ORDER — MELOXICAM 15 MG PO TABS: 15.0000 mg | ORAL_TABLET | Freq: Every day | ORAL | 0 refills | Status: DC

## 2019-08-19 NOTE — Progress Notes (Signed)
SUBJECTIVE:   CHIEF COMPLAINT / HPI:   Fatigue Patient has a history of vitamin B12 deficiency, has been getting monthly B12 injections but has missed the last 2 months Reports that she has been feeling tired over the past month States that this is similar to what happened when her she found out that her B12 was low when it first placed Her last vitamin B12 in February was greater than 2000 She also has a history of iron deficiency anemia and reports taking ferrous sulfate daily without constipation TSH was also checked in February and was within normal limits Patient reports that her mood is very well controlled, 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 female in NAD Cardio: RRR no m/r/g Lungs: CTAB, no wheezing, no rhonchi, no crackles, no IWOB on RA Extremities: No edema  Right knee: - Inspection: Mild swelling of right knee compared to left, no overlying erythema or ecchymosis. Skin intact.  No other deformities bilaterally - Palpation: Tenderness to palpation along the medial joint line with mild effusion palpated.  No tenderness palpation of left knee. - ROM: Crepitus palpated on right with flexion extension of knee, full active ROM with flexion and extension in knee and hip bilaterally, significant pain with extreme of knee flexion on right - Strength: 5/5 strength  in all fields bilaterally aside from 4/5 hip abduction in the right lower extremity - Neuro/vasc: NV intact bilaterally - Special Tests: - LIGAMENTS: negative anterior and posterior drawer, negative Lachman's, no MCL or LCL laxity bilaterally -- MENISCUS: negative McMurray's, negative Thessaly  -- PF JOINT: nml patellar mobility bilaterally.  negative patellar grind, negative patellar apprehension bilaterally  Hips: normal ROM, negative FABER and FADIR bilaterally   ASSESSMENT/PLAN:   Fatigue Acute on chronic fatigue.  Will recheck patient's vitamin B12 levels given that they were greater than 2000 on last check in February.  We will then base the need for B12 injection off of this result.  Do not think a TSH needs to be repeated at this time as it was within normal limits in February.  We will also recheck ferritin and a CBC to assess for improvement in ferritin levels, hemoglobin level, and white blood cell count for possible signs of infection.  Acute pain of right knee Suspect that patient's pain is complicated by osteoarthritis and patellofemoral pain syndrome.  Discussed this with patient.  Given that she has both of these currently causing significant pain, recommend that patient take Mobic 15 mg daily for 2 weeks.  Advised to avoid other NSAIDs while taking this.  Ensured the patient does not have a history of GI bleed.  Also discussed icing and elevation.  Patient opts for in person physical therapy, she has been referred for this.  Return in  1 month if no improvement in her pain.  Consider x-rays if no improvement.  Also advised patient to avoid kneeling exercises.  History of anaphylaxis EpiPen refilled at patient request.     Cleophas Dunker, [place]

## 2019-08-19 NOTE — Patient Instructions (Signed)
Thank you for coming to see me today. It was a pleasure. Today we talked about:   Ice your knee at least twice a day.  Keep it elevated as much as possible.  I have placed a referral to physical therapy for your knee.  If you do not hear from them in the next 2 weeks, please give [place] a call.    We will get some labs today.  If they are abnormal or we need to do something about them, I will call you.  If they are normal, I will send you a message on MyChart (if it is active) or a letter in the mail.  If you don't hear from [place] in 2 weeks, please call the office at the number below.  Please follow-up with your PCP in 1 month if no improvement   If you have any questions or concerns, please do not hesitate to call the office at [phone].  Best,   [place] Constable, DO

## 2019-08-20 DIAGNOSIS — M25561 Pain in right knee: Secondary | ICD-10-CM | POA: Insufficient documentation

## 2019-08-20 LAB — CBC
Hematocrit: 32.7 % — ABNORMAL LOW (ref 34.0–46.6)
Hemoglobin: 10.7 g/dL — ABNORMAL LOW (ref 11.1–15.9)
MCH: 28.2 pg (ref 26.6–33.0)
MCHC: 32.7 g/dL (ref 31.5–35.7)
MCV: 86 fL (ref 79–97)
Platelets: 317 10*3/uL (ref 150–450)
RBC: 3.79 x10E6/uL (ref 3.77–5.28)
RDW: 16.1 % — ABNORMAL HIGH (ref 11.7–15.4)
WBC: 6.6 10*3/uL (ref 3.4–10.8)

## 2019-08-20 LAB — FERRITIN: Ferritin: 14 ng/mL — ABNORMAL LOW (ref 15–150)

## 2019-08-20 LAB — VITAMIN B12: Vitamin B-12: 208 pg/mL — ABNORMAL LOW (ref 232–1245)

## 2019-08-20 NOTE — Assessment & Plan Note (Signed)
Acute on chronic fatigue.  Will recheck patient's vitamin B12 levels given that they were greater than 2000 on last check in February.  We will then base the need for B12 injection off of this result.  Do not think a TSH needs to be repeated at this time as it was within normal limits in February.  We will also recheck ferritin and a CBC to assess for improvement in ferritin levels, hemoglobin level, and white blood cell count for possible signs of infection.

## 2019-08-20 NOTE — Assessment & Plan Note (Signed)
EpiPen refilled at patient request.

## 2019-08-20 NOTE — Assessment & Plan Note (Signed)
Suspect that patient's pain is complicated by osteoarthritis and patellofemoral pain syndrome.  Discussed this with patient.  Given that she has both of these currently causing significant pain, recommend that patient take Mobic 15 mg daily for 2 weeks.  Advised to avoid other NSAIDs while taking this.  Ensured the patient does not have a history of GI bleed.  Also discussed icing and elevation.  Patient opts for in person physical therapy, she has been referred for this.  Return in 1 month if no improvement in her pain.  Consider x-rays if no improvement.  Also advised patient to avoid kneeling exercises.

## 2019-08-22 ENCOUNTER — Ambulatory Visit (INDEPENDENT_AMBULATORY_CARE_PROVIDER_SITE_OTHER): Payer: 59

## 2019-08-22 ENCOUNTER — Other Ambulatory Visit: Payer: Self-pay

## 2019-08-22 DIAGNOSIS — E538 Deficiency of other specified B group vitamins: Secondary | ICD-10-CM

## 2019-08-22 MED ORDER — CYANOCOBALAMIN 1000 MCG/ML IJ SOLN
1000.0000 ug | Freq: Once | INTRAMUSCULAR | Status: AC
  Administered 2019-08-22: 1000 ug via INTRAMUSCULAR

## 2019-08-22 NOTE — Progress Notes (Signed)
Patient presents in nurse clinic for B12 injection.   Date of last office visit B12 was discussed 08/19/2019.  Injection given LD and tolerated well.

## 2019-08-30 ENCOUNTER — Other Ambulatory Visit: Payer: Self-pay

## 2019-08-30 ENCOUNTER — Other Ambulatory Visit: Payer: Self-pay | Admitting: Family Medicine

## 2019-08-30 ENCOUNTER — Ambulatory Visit: Payer: 59

## 2019-08-30 ENCOUNTER — Telehealth: Payer: Self-pay | Admitting: *Deleted

## 2019-08-30 DIAGNOSIS — Z87892 Personal history of anaphylaxis: Secondary | ICD-10-CM

## 2019-08-30 MED ORDER — EPINEPHRINE 0.3 MG/0.3ML IJ SOAJ: 0.3000 mg | INTRAMUSCULAR | 1 refills | Status: DC | PRN

## 2019-08-30 NOTE — Telephone Encounter (Signed)
Received fax from [facility] referencing pts RX for EPINEPHrine (ADRENACLICK) 0.15mg /0.34ml injection.   Note says TOO EXPENSIVE/PT WANTS TO HAVE SOMETHING LESS EXPENSIVE.   Jodie Cavey Zimmerman Rumple, CMA

## 2019-08-30 NOTE — Telephone Encounter (Signed)
Generic Rx sent, hopefully this will be cheaper for her.

## 2019-08-30 NOTE — Progress Notes (Signed)
Patient requesting new rx sent for epipen given cost.  Patient's prior prescription was actually for epi-pen jr dpsing, so changed to adult dosing.  Rx sent.

## 2019-08-31 NOTE — Telephone Encounter (Signed)
Contacted pharmacy and asked about the new Rx and they said it was going to be $300+ for this. So not sure this will work for her either and not sure what other options there may be. Meghan [name], CMA

## 2019-08-31 NOTE — Telephone Encounter (Signed)
If she uses [facility], we can [facility] and it should be about 108, but that is the cheapest that it will likely be.  Unfortunately, epipens are expensive.  Could you please call and ask if she is okay with this?  If so, I can send [facility] to the [facility] choice

## 2019-09-06 NOTE — Telephone Encounter (Signed)
Contacted pt and informed her of below and she said that she would do that but right now she has a lot of things going on and she will call when she is ready to have it sent to a [facility].Meghan [name], CMA

## 2019-09-13 ENCOUNTER — Encounter: Payer: Self-pay | Admitting: Physical Therapy

## 2019-09-13 ENCOUNTER — Ambulatory Visit: Payer: 59 | Attending: Family Medicine | Admitting: Physical Therapy

## 2019-09-13 ENCOUNTER — Other Ambulatory Visit: Payer: Self-pay

## 2019-09-13 DIAGNOSIS — M6281 Muscle weakness (generalized): Secondary | ICD-10-CM | POA: Insufficient documentation

## 2019-09-13 DIAGNOSIS — R600 Localized edema: Secondary | ICD-10-CM | POA: Insufficient documentation

## 2019-09-13 DIAGNOSIS — M25561 Pain in right knee: Secondary | ICD-10-CM | POA: Insufficient documentation

## 2019-09-14 NOTE — Therapy (Signed)
[place], [place], [address] Phone: [phone]   Fax:  [phone]  Physical Therapy Evaluation  Patient Details  Name: Meghan [name] MRN: [id number] Date of Birth: Nov 10, 1960 Referring Provider (PT): McDiarmid, Blane Ohara, MD   Encounter Date: 09/13/2019   PT End of Session - 09/13/19 1636    Visit Number 1    Number of Visits 17    Date for PT Re-Evaluation 11/08/19    Authorization Type [employer]    PT Start Time 1622    PT Stop Time 1700    PT Time Calculation (min) 38 min    Activity Tolerance Patient tolerated treatment well    Behavior During Therapy [facility] for tasks assessed/performed           Past Medical History:  Diagnosis Date  . Angio-edema   . Asthma   . Lichen planus   . Malignant hyperthermia    1988, 1987  . Urticaria     Past Surgical History:  Procedure Laterality Date  . ADENOIDECTOMY    . ESOPHAGOGASTRODUODENOSCOPY (EGD) WITH PROPOFOL N/A 02/25/2019   Procedure: ESOPHAGOGASTRODUODENOSCOPY (EGD) WITH PROPOFOL;  Surgeon: Carol Ada, MD;  Location: [facility];  Service: Endoscopy;  Laterality: N/A;  . HEMOSTASIS CLIP PLACEMENT  02/25/2019   Procedure: HEMOSTASIS CLIP PLACEMENT;  Surgeon: Carol Ada, MD;  Location: [facility];  Service: Endoscopy;;  . POLYPECTOMY  02/25/2019   Procedure: POLYPECTOMY;  Surgeon: Carol Ada, MD;  Location: [facility];  Service: Endoscopy;;  . TONSILLECTOMY  1987  . TUBAL LIGATION  1988  . UTERINE ARTERY EMBOLIZATION  2000   for fibroids     There were no vitals filed for this visit.    Subjective Assessment - 09/13/19 1624    Subjective Patient reports she was doing squats and then the next day she woke up with swelling and discomfort. This was about a month ago. She does have some swelling that will fluctuate. She does occasionally have popping of the knee. Currently she has trouble bending the knee, going up/down the  steps, walking extended periods, sitting will cause the knee to get tight/stiff, and she feels she doesn't have any strength in the leg supporting her knee. She has used a compression sleeve and feels this has helped.    Limitations Lifting;Standing;Walking;House hold activities;Sitting    How long can you sit comfortably? 20-30 minutes    How long can you stand comfortably? 20 minutes    How long can you walk comfortably? 1 mile    Patient Stated Goals Patient would like to get rid of the pain    Currently in Pain? Yes    Pain Score 7     Pain Location Knee    Pain Orientation Right    Pain Descriptors / Indicators Pressure;Tightness;Sharp    Pain Type Acute pain    Pain Onset More than a month ago    Pain Frequency Constant    Aggravating Factors  Walking, standing, stairs, bending    Pain Relieving Factors Elevating the leg    Effect of Pain on Daily Activities Patient limited with weight bearing tasks              [facility] PT Assessment - 09/14/19 0001      Assessment   Medical Diagnosis Acute pain of right knee    Referring Provider (PT) McDiarmid, Blane Ohara, MD    Onset Date/Surgical Date --   approximately 1 month ago  Hand Dominance Left    Next MD Visit Not scheduled    Prior Therapy None      Precautions   Precautions None      Restrictions   Weight Bearing Restrictions No      Balance Screen   Has the patient fallen in the past 6 months No    Has the patient had a decrease in activity level because of a fear of falling?  No    Is the patient reluctant to leave their home because of a fear of falling?  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, bending, running    Leisure [place], [employer]      Cognition   Overall Cognitive Status Within Functional Limits for tasks assessed      Observation/Other Assessments   Observations Patient appears in no apparent distress    Focus on Therapeutic Outcomes (FOTO)  57% limitation      Observation/Other Assessments-Edema    Edema Circumferential      Circumferential Edema   Circumferential - Right 44 cm    Circumferential - Left  40 cm      Sensation   Light Touch Appears Intact      Coordination   Gross Motor Movements are Fluid and Coordinated Yes      ROM / Strength   AROM / PROM / Strength AROM;Strength      AROM   AROM Assessment Site Knee    Right/Left Knee Right;Left    Right Knee Extension -3   lacking   Right Knee Flexion 90    Left Knee Extension 0    Left Knee Flexion 130      Strength   Strength Assessment Site Hip;Knee    Right/Left Hip Right;Left    Right Hip Flexion 4-/5    Right Hip Extension 4-/5    Right Hip ABduction 4-/5    Left Hip Flexion 4/5    Left Hip Extension 4-/5    Left Hip ABduction 4-/5    Right/Left Knee Right;Left    Right Knee Flexion 4-/5    Right Knee Extension 4/5    Left Knee Flexion 5/5    Left Knee Extension 5/5      Palpation   Patella mobility WFL    Palpation comment Patient tender over medial joint line and general medial aspect of knee      Special Tests    Special Tests Knee Special Tests;Meniscus Tests    Other special tests Patient reported medial knee pain with MCL stability testing, no laxity noted    Meniscus Tests McMurray Test      McMurray Test   Findings Negative      Transfers   Transfers Independent with all Transfers      Ambulation/Gait   Ambulation/Gait Yes    Ambulation/Gait Assistance 7: Independent    Gait Comments Slightly antalgic on right  Objective measurements completed on  examination: See above findings.       [facility] Adult PT Treatment/Exercise - 09/14/19 0001      Exercises   Exercises Knee/Hip      Knee/Hip Exercises: Stretches   Knee: Self-Stretch Limitations Supine heel slide with strap x10      Knee/Hip Exercises: Supine   Quad Sets 10 reps    Short Arc Quad Sets 10 reps    Straight Leg Raises 10 reps                  PT Education - 09/13/19 1636    Education Details Exam findings, POC, HEP    Person(s) Educated Patient    Methods Explanation;Demonstration;Tactile cues;Verbal cues;Handout    Comprehension Verbalized understanding;Returned demonstration;Verbal cues required;Tactile cues required;Need further instruction            PT Short Term Goals - 09/13/19 1637      PT SHORT TERM GOAL #1   Title Patient will be I with initial HEP to progress with PT    Time 4    Period Weeks    Status New    Target Date 10/11/19      PT SHORT TERM GOAL #2   Title Patient will exhibit improvement in knee swelling to </= 41 cm to improve knee motion.    Time 4    Period Weeks    Status New    Target Date 10/11/19      PT SHORT TERM GOAL #3   Title Patient will exhibit improve knee motion >/= 110 deg knee flexion to improve gait    Time 4    Period Weeks    Status New    Target Date 10/11/19      PT SHORT TERM GOAL #4   Title Patient will report </= 4/10 pain level to reduce functional limitation    Time 4    Period Weeks    Status New    Target Date 10/11/19             PT Long Term Goals - 09/13/19 1638      PT LONG TERM GOAL #1   Title Patient will be I with final HEP to maintain progress with PT    Time 8    Period Weeks    Status New    Target Date 11/08/19      PT LONG TERM GOAL #2   Title Patient will report no limitation with walking or work related activities    Time 8    Period Weeks    Status New    Target Date 11/08/19      PT LONG TERM GOAL #3   Title Patient will exhibit normalized knee AROM to  equal opposite side in order allow for improved movement quality    Time 8    Period Weeks    Status New    Target Date 11/08/19      PT LONG TERM GOAL #4   Title Patient will exhibit improved right knee strength to grossly = 5/5 MMT to improve stair negotiation and squatting    Time 8    Period Weeks    Status New    Target Date 11/08/19      PT LONG TERM GOAL #5   Title Patient will report </= 2/10 pain level with activity to be able to perform ADLs without limitation    Time 8    Period Weeks  Status New    Target Date 11/08/19                  Plan - 09/14/19 1348    Clinical Impression Statement Patient presents to PT with subacute right knee pain that began to be painful and swollen a day after she was doing squats. She exhibits limitation in right knee motion, strength, increased swelling, gait deviations, and increased pain that limit her activity level and ability to perform work tasks effectively. Her symptoms seem to be consistent with possible meniscal injury vs. OA. She would benefit from continued skilled PT to progress her motion and strength to improve walking ability and reduce pain with activity so she can return to prior level of function.    Personal Factors and Comorbidities Time since onset of injury/illness/exacerbation;Fitness;Profession    Examination-Activity Limitations Bend;Stand;Stairs;Squat;Sit;Locomotion Level    Examination-Participation Restrictions Cleaning;Driving;Meal Prep;Occupation;Shop;Yard Work    Stability/Clinical Decision Making Stable/Uncomplicated    Clinical Decision Making Low    Rehab Potential Good    PT Frequency 2x / week    PT Duration 8 weeks    PT Treatment/Interventions ADLs/Self Care Home Management;Cryotherapy;Electrical Stimulation;Moist Heat;Iontophoresis 4mg /ml Dexamethasone;Neuromuscular re-education;Balance training;Therapeutic exercise;Therapeutic activities;Functional mobility training;Stair training;Gait  training;Patient/family education;Manual techniques;Dry needling;Passive range of motion;Taping;Vasopneumatic Device;Joint Manipulations    PT Next Visit Plan Assess HEP and progress PRN, manual to improve knee flexion, quad and hip strengthening, taping for swelling, vaso    PT Home Exercise Plan Quad set, SAQ, SLR, supine heel slide with strap    Consulted and Agree with Plan of Care Patient           Patient will benefit from skilled therapeutic intervention in order to improve the following deficits and impairments:  Abnormal gait, Decreased range of motion, Increased edema, Decreased strength, Pain, Decreased activity tolerance, Difficulty walking  Visit Diagnosis: Acute pain of right knee  Muscle weakness (generalized)  Localized edema     Problem List Patient Active Problem List   Diagnosis Date Noted  . Acute pain of right knee 08/20/2019  . Fatigue 03/28/2019  . Abnormal Pap smear of cervix 11/10/2018  . Preventative health care 11/04/2018  . Iron deficiency anemia 10/27/2018  . Lichen planus 84/69/6295  . Seborrheic keratoses 06/11/2017  . History of anaphylaxis 03/04/2017  . Vitamin B 12 deficiency 01/14/2016  . Unspecified sinusitis (chronic) 08/12/2012  . Back pain 01/13/2012  . Headache(784.0) 10/01/2010  . 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  . Breast cancer screening 09/13/2010  . Polyp of colon, adenomatous 01/29/2007  . GERD (gastroesophageal reflux disease) 01/29/2007  . Osteoarthritis, multiple sites 04/09/2006    Hilda Blades, PT, DPT, LAT, ATC 09/14/19  4:31 PM Phone: [phone] Fax: [facility] [address] [place], [place], [address] Phone: [phone]   Fax:  [phone]  Name: Meghan [name] MRN: [id number] Date of Birth: [birth year]

## 2019-09-20 ENCOUNTER — Encounter: Payer: Self-pay | Admitting: Physical Therapy

## 2019-09-20 ENCOUNTER — Other Ambulatory Visit: Payer: Self-pay

## 2019-09-20 ENCOUNTER — Ambulatory Visit: Payer: 59 | Admitting: Physical Therapy

## 2019-09-20 DIAGNOSIS — M25561 Pain in right knee: Secondary | ICD-10-CM | POA: Diagnosis not present

## 2019-09-20 DIAGNOSIS — M6281 Muscle weakness (generalized): Secondary | ICD-10-CM

## 2019-09-20 DIAGNOSIS — R600 Localized edema: Secondary | ICD-10-CM

## 2019-09-20 NOTE — Therapy (Signed)
[place], [place], [address] Phone: [phone]   Fax:  [phone]  Physical Therapy Treatment  Patient Details  Name: Meghan [name] MRN: [id number] Date of Birth: 1960/10/29 Referring Provider (PT): McDiarmid, Blane Ohara, MD   Encounter Date: 09/20/2019   PT End of Session - 09/20/19 1637    Visit Number 2    Number of Visits 17    Date for PT Re-Evaluation 11/08/19    Authorization Type [employer]    PT Start Time 1634    PT Stop Time 1727    PT Time Calculation (min) 53 min    Activity Tolerance Patient tolerated treatment well    Behavior During Therapy [facility] for tasks assessed/performed           Past Medical History:  Diagnosis Date  . Angio-edema   . Asthma   . Lichen planus   . Malignant hyperthermia    1988, 1987  . Urticaria     Past Surgical History:  Procedure Laterality Date  . ADENOIDECTOMY    . ESOPHAGOGASTRODUODENOSCOPY (EGD) WITH PROPOFOL N/A 02/25/2019   Procedure: ESOPHAGOGASTRODUODENOSCOPY (EGD) WITH PROPOFOL;  Surgeon: Carol Ada, MD;  Location: [facility];  Service: Endoscopy;  Laterality: N/A;  . HEMOSTASIS CLIP PLACEMENT  02/25/2019   Procedure: HEMOSTASIS CLIP PLACEMENT;  Surgeon: Carol Ada, MD;  Location: [facility];  Service: Endoscopy;;  . POLYPECTOMY  02/25/2019   Procedure: POLYPECTOMY;  Surgeon: Carol Ada, MD;  Location: [facility];  Service: Endoscopy;;  . TONSILLECTOMY  1987  . TUBAL LIGATION  1988  . UTERINE ARTERY EMBOLIZATION  2000   for fibroids     There were no vitals filed for this visit.   Subjective Assessment - 09/20/19 1635    Subjective Today was completely lose and was up and moving around. Have been wearing a brace.    Patient Stated Goals Patient would like to get rid of the pain    Currently in Pain? Yes    Pain Score 6    when bending   Pain Location Knee    Pain Orientation Right    Pain Descriptors / Indicators  Pressure                             [facility] Adult PT Treatment/Exercise - 09/20/19 0001      Knee/Hip Exercises: Stretches   Passive Hamstring Stretch Both;30 seconds    Piriformis Stretch Limitations seated figure 4      Knee/Hip Exercises: Aerobic   Stationary Bike 65min      Knee/Hip Exercises: Sidelying   Hip ABduction Limitations arcs x20 each    Clams x30 each ball bw ankles      Knee/Hip Exercises: Prone   Hip Extension Both;20 reps      Modalities   Modalities Vasopneumatic      Vasopneumatic   Number Minutes Vasopneumatic  15 minutes    Vasopnuematic Location  Knee    Vasopneumatic Pressure Low    Vasopneumatic Temperature  coldest      Manual Therapy   Manual Therapy Joint mobilization    Joint Mobilization Rt patellar mobilzations all motions                    PT Short Term Goals - 09/13/19 1637      PT SHORT TERM GOAL #1   Title Patient will be I with initial HEP to  progress with PT    Time 4    Period Weeks    Status New    Target Date 10/11/19      PT SHORT TERM GOAL #2   Title Patient will exhibit improvement in knee swelling to </= 41 cm to improve knee motion.    Time 4    Period Weeks    Status New    Target Date 10/11/19      PT SHORT TERM GOAL #3   Title Patient will exhibit improve knee motion >/= 110 deg knee flexion to improve gait    Time 4    Period Weeks    Status New    Target Date 10/11/19      PT SHORT TERM GOAL #4   Title Patient will report </= 4/10 pain level to reduce functional limitation    Time 4    Period Weeks    Status New    Target Date 10/11/19             PT Long Term Goals - 09/13/19 1638      PT LONG TERM GOAL #1   Title Patient will be I with final HEP to maintain progress with PT    Time 8    Period Weeks    Status New    Target Date 11/08/19      PT LONG TERM GOAL #2   Title Patient will report no limitation with walking or work related activities    Time 8     Period Weeks    Status New    Target Date 11/08/19      PT LONG TERM GOAL #3   Title Patient will exhibit normalized knee AROM to equal opposite side in order allow for improved movement quality    Time 8    Period Weeks    Status New    Target Date 11/08/19      PT LONG TERM GOAL #4   Title Patient will exhibit improved right knee strength to grossly = 5/5 MMT to improve stair negotiation and squatting    Time 8    Period Weeks    Status New    Target Date 11/08/19      PT LONG TERM GOAL #5   Title Patient will report </= 2/10 pain level with activity to be able to perform ADLs without limitation    Time 8    Period Weeks    Status New    Target Date 11/08/19                 Plan - 09/20/19 1714    Clinical Impression Statement Pt tolerated exercises well. Advanced sidelying and prone exercises that she was already doing at home. She was able to make full revolutions on bike in last 2 min of ride. applied vaso to address swelling.    PT Treatment/Interventions ADLs/Self Care Home Management;Cryotherapy;Electrical Stimulation;Moist Heat;Iontophoresis 4mg /ml Dexamethasone;Neuromuscular re-education;Balance training;Therapeutic exercise;Therapeutic activities;Functional mobility training;Stair training;Gait training;Patient/family education;Manual techniques;Dry needling;Passive range of motion;Taping;Vasopneumatic Device;Joint Manipulations    PT Next Visit Plan Assess HEP and progress PRN, manual to improve knee flexion, quad and hip strengthening, taping for swelling, vaso- asked her to bring shorts for taping    PT [place] and Agree with Plan of Care Patient           Patient will benefit from skilled therapeutic intervention in order to improve the following deficits and impairments:  Abnormal gait, Decreased range of motion, Increased edema, Decreased strength, Pain, Decreased activity tolerance, Difficulty walking  Visit  Diagnosis: Acute pain of right knee  Muscle weakness (generalized)  Localized edema     Problem List Patient Active Problem List   Diagnosis Date Noted  . Acute pain of right knee 08/20/2019  . Fatigue 03/28/2019  . Abnormal Pap smear of cervix 11/10/2018  . Preventative health care 11/04/2018  . Iron deficiency anemia 10/27/2018  . Lichen planus 41/32/4401  . Seborrheic keratoses 06/11/2017  . History of anaphylaxis 03/04/2017  . Vitamin B 12 deficiency 01/14/2016  . Unspecified sinusitis (chronic) 08/12/2012  . Back pain 01/13/2012  . Headache(784.0) 10/01/2010  . 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  . Breast cancer screening 09/13/2010  . Polyp of colon, adenomatous 01/29/2007  . GERD (gastroesophageal reflux disease) 01/29/2007  . Osteoarthritis, multiple sites 04/09/2006   Djuan Talton C. Nijah Orlich PT, DPT 09/20/19 5:24 PM   [facility] [address] [place], [place], [address] Phone: [phone]   Fax:  [phone]  Name: MIRZA KIDNEY MRN: [id number] Date of Birth: 1960/02/25

## 2019-09-21 ENCOUNTER — Ambulatory Visit: Payer: 59 | Admitting: Physical Therapy

## 2019-09-21 ENCOUNTER — Encounter: Payer: Self-pay | Admitting: Physical Therapy

## 2019-09-21 DIAGNOSIS — M6281 Muscle weakness (generalized): Secondary | ICD-10-CM

## 2019-09-21 DIAGNOSIS — M25561 Pain in right knee: Secondary | ICD-10-CM | POA: Diagnosis not present

## 2019-09-21 DIAGNOSIS — R600 Localized edema: Secondary | ICD-10-CM

## 2019-09-21 NOTE — Therapy (Signed)
[place], [place], [address] Phone: [phone]   Fax:  [phone]  Physical Therapy Treatment  Patient Details  Name: Meghan [name] MRN: [id number] Date of Birth: May 23, 1960 Referring Provider (PT): McDiarmid, Blane Ohara, MD   Encounter Date: 09/21/2019   PT End of Session - 09/21/19 1619    Visit Number 3    Number of Visits 17    Date for PT Re-Evaluation 11/08/19    Authorization Type [employer]    PT Start Time 1547    PT Stop Time 1643    PT Time Calculation (min) 56 min    Activity Tolerance Patient tolerated treatment well    Behavior During Therapy [facility] for tasks assessed/performed           Past Medical History:  Diagnosis Date  . Angio-edema   . Asthma   . Lichen planus   . Malignant hyperthermia    1988, 1987  . Urticaria     Past Surgical History:  Procedure Laterality Date  . ADENOIDECTOMY    . ESOPHAGOGASTRODUODENOSCOPY (EGD) WITH PROPOFOL N/A 02/25/2019   Procedure: ESOPHAGOGASTRODUODENOSCOPY (EGD) WITH PROPOFOL;  Surgeon: Carol Ada, MD;  Location: [facility];  Service: Endoscopy;  Laterality: N/A;  . HEMOSTASIS CLIP PLACEMENT  02/25/2019   Procedure: HEMOSTASIS CLIP PLACEMENT;  Surgeon: Carol Ada, MD;  Location: [facility];  Service: Endoscopy;;  . POLYPECTOMY  02/25/2019   Procedure: POLYPECTOMY;  Surgeon: Carol Ada, MD;  Location: [facility];  Service: Endoscopy;;  . TONSILLECTOMY  1987  . TUBAL LIGATION  1988  . UTERINE ARTERY EMBOLIZATION  2000   for fibroids     There were no vitals filed for this visit.   Subjective Assessment - 09/21/19 1549    Subjective It feels worked. I did my workout this AM.    Patient Stated Goals Patient would like to get rid of the pain                             [facility] Adult PT Treatment/Exercise - 09/21/19 0001      Knee/Hip Exercises: Aerobic   Stationary Bike 44min   able to make full  revolutions in full 5 min     Knee/Hip Exercises: Standing   Heel Raises 20 reps    Rocker Board Limitations a/p static & dynamic    Other Standing Knee Exercises T- hinge holding FM bar, standing on Rt leg      Knee/Hip Exercises: Supine   Straight Leg Raises 15 reps    Straight Leg Raise with External Rotation 15 reps      Knee/Hip Exercises: Sidelying   Hip ABduction Limitations circles x10 each direction    Other Sidelying Knee/Hip Exercises sidelying abd+hip flexion knee to elbow      Vasopneumatic   Number Minutes Vasopneumatic  15 minutes    Vasopnuematic Location  Knee    Vasopneumatic Pressure Low    Vasopneumatic Temperature  coldest      Manual Therapy   Manual Therapy Taping    Kinesiotex Edema      Kinesiotix   Edema Rt knee 2 strips                    PT Short Term Goals - 09/13/19 1637      PT SHORT TERM GOAL #1   Title Patient will be I with initial HEP to progress with PT  Time 4    Period Weeks    Status New    Target Date 10/11/19      PT SHORT TERM GOAL #2   Title Patient will exhibit improvement in knee swelling to </= 41 cm to improve knee motion.    Time 4    Period Weeks    Status New    Target Date 10/11/19      PT SHORT TERM GOAL #3   Title Patient will exhibit improve knee motion >/= 110 deg knee flexion to improve gait    Time 4    Period Weeks    Status New    Target Date 10/11/19      PT SHORT TERM GOAL #4   Title Patient will report </= 4/10 pain level to reduce functional limitation    Time 4    Period Weeks    Status New    Target Date 10/11/19             PT Long Term Goals - 09/13/19 1638      PT LONG TERM GOAL #1   Title Patient will be I with final HEP to maintain progress with PT    Time 8    Period Weeks    Status New    Target Date 11/08/19      PT LONG TERM GOAL #2   Title Patient will report no limitation with walking or work related activities    Time 8    Period Weeks    Status New      Target Date 11/08/19      PT LONG TERM GOAL #3   Title Patient will exhibit normalized knee AROM to equal opposite side in order allow for improved movement quality    Time 8    Period Weeks    Status New    Target Date 11/08/19      PT LONG TERM GOAL #4   Title Patient will exhibit improved right knee strength to grossly = 5/5 MMT to improve stair negotiation and squatting    Time 8    Period Weeks    Status New    Target Date 11/08/19      PT LONG TERM GOAL #5   Title Patient will report </= 2/10 pain level with activity to be able to perform ADLs without limitation    Time 8    Period Weeks    Status New    Target Date 11/08/19                 Plan - 09/21/19 1628    Clinical Impression Statement continued with gross strengthening. Good tolerance to CKC but did feel incr pressure. quad lag approx 4 deg in SLR. Applied ktape for edema which she reported feelt good. Arrived wearing knee sleeve which does help her.    PT Treatment/Interventions ADLs/Self Care Home Management;Cryotherapy;Electrical Stimulation;Moist Heat;Iontophoresis 4mg /ml Dexamethasone;Neuromuscular re-education;Balance training;Therapeutic exercise;Therapeutic activities;Functional mobility training;Stair training;Gait training;Patient/family education;Manual techniques;Dry needling;Passive range of motion;Taping;Vasopneumatic Device;Joint Manipulations    PT Next Visit Plan retape PRN, cont CKC as tol    PT Home Exercise Plan [email or web address]    Consulted and Agree with Plan of Care Patient           Patient will benefit from skilled therapeutic intervention in order to improve the following deficits and impairments:  Abnormal gait, Decreased range of motion, Increased edema, Decreased strength, Pain, Decreased activity tolerance, Difficulty walking  Visit Diagnosis: Acute pain of right  knee  Muscle weakness (generalized)  Localized edema     Problem List Patient Active Problem List   Diagnosis  Date Noted  . Acute pain of right knee 08/20/2019  . Fatigue 03/28/2019  . Abnormal Pap smear of cervix 11/10/2018  . Preventative health care 11/04/2018  . Iron deficiency anemia 10/27/2018  . Lichen planus 87/56/4332  . Seborrheic keratoses 06/11/2017  . History of anaphylaxis 03/04/2017  . Vitamin B 12 deficiency 01/14/2016  . Unspecified sinusitis (chronic) 08/12/2012  . Back pain 01/13/2012  . Headache(784.0) 10/01/2010  . 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  . Breast cancer screening 09/13/2010  . Polyp of colon, adenomatous 01/29/2007  . GERD (gastroesophageal reflux disease) 01/29/2007  . Osteoarthritis, multiple sites 04/09/2006   Laverda Stribling C. Shaquina Gillham PT, DPT 09/21/19 4:30 PM   [facility] [address] [place], [place], [address] Phone: [phone]   Fax:  [phone]  Name: Meghan [name] MRN: [id number] Date of Birth: [birth year]

## 2019-09-22 ENCOUNTER — Ambulatory Visit: Payer: 59 | Admitting: Physical Therapy

## 2019-09-27 ENCOUNTER — Other Ambulatory Visit: Payer: Self-pay

## 2019-09-27 ENCOUNTER — Encounter: Payer: Self-pay | Admitting: Physical Therapy

## 2019-09-27 ENCOUNTER — Ambulatory Visit: Payer: 59 | Admitting: Physical Therapy

## 2019-09-27 DIAGNOSIS — M6281 Muscle weakness (generalized): Secondary | ICD-10-CM

## 2019-09-27 DIAGNOSIS — R600 Localized edema: Secondary | ICD-10-CM

## 2019-09-27 DIAGNOSIS — M25561 Pain in right knee: Secondary | ICD-10-CM

## 2019-09-27 NOTE — Therapy (Signed)
[place], [place], [address] Phone: [phone]   Fax:  [phone]  Physical Therapy Treatment  Patient Details  Name: Meghan [name] MRN: [id number] Date of Birth: 06-10-1960 Referring Provider (PT): McDiarmid, Blane Ohara, MD   Encounter Date: 09/27/2019   PT End of Session - 09/27/19 1633    Visit Number 4    Number of Visits 17    Date for PT Re-Evaluation 11/08/19    Authorization Type [employer]    PT Start Time 1633    PT Stop Time 1707    PT Time Calculation (min) 34 min    Activity Tolerance Patient limited by pain    Behavior During Therapy [facility] for tasks assessed/performed           Past Medical History:  Diagnosis Date  . Angio-edema   . Asthma   . Lichen planus   . Malignant hyperthermia    1988, 1987  . Urticaria     Past Surgical History:  Procedure Laterality Date  . ADENOIDECTOMY    . ESOPHAGOGASTRODUODENOSCOPY (EGD) WITH PROPOFOL N/A 02/25/2019   Procedure: ESOPHAGOGASTRODUODENOSCOPY (EGD) WITH PROPOFOL;  Surgeon: Carol Ada, MD;  [facility]: [facility];  Service: Endoscopy;  Laterality: N/A;  . HEMOSTASIS CLIP PLACEMENT  02/25/2019   Procedure: HEMOSTASIS CLIP PLACEMENT;  Surgeon: Carol Ada, MD;  Location: [facility];  Service: Endoscopy;;  . POLYPECTOMY  02/25/2019   Procedure: POLYPECTOMY;  Surgeon: Carol Ada, MD;  Location: [facility];  Service: Endoscopy;;  . TONSILLECTOMY  1987  . TUBAL LIGATION  1988  . UTERINE ARTERY EMBOLIZATION  2000   for fibroids     There were no vitals filed for this visit.   Subjective Assessment - 09/27/19 1635    Subjective exercises are going well at home. it gets loose but then tightens back up. I have been trying to do more walking at work. the tape was helpful.    Patient Stated Goals Patient would like to get rid of the pain              [facility] PT Assessment - 09/27/19 0001      AROM   Right Knee Extension 0     Right Knee Flexion 114   pain at end range                        [facility] Adult PT Treatment/Exercise - 09/27/19 0001      Knee/Hip Exercises: Aerobic   Stationary Bike 103min      Knee/Hip Exercises: Standing   Forward Step Up 10 reps;Hand Hold: 1;Step Height: 4"      Knee/Hip Exercises: Supine   Straight Leg Raises 15 reps      Knee/Hip Exercises: Sidelying   Hip ABduction Both;10 reps    Hip ABduction Limitations arcs      Manual Therapy   Manual therapy comments IASTM Rt VL & edu on self rolling      Kinesiotix   Edema Rt knee 2 strips                    PT Short Term Goals - 09/13/19 1637      PT SHORT TERM GOAL #1   Title Patient will be I with initial HEP to progress with PT    Time 4    Period Weeks    Status New    Target Date 10/11/19  PT SHORT TERM GOAL #2   Title Patient will exhibit improvement in knee swelling to </= 41 cm to improve knee motion.    Time 4    Period Weeks    Status New    Target Date 10/11/19      PT SHORT TERM GOAL #3   Title Patient will exhibit improve knee motion >/= 110 deg knee flexion to improve gait    Time 4    Period Weeks    Status New    Target Date 10/11/19      PT SHORT TERM GOAL #4   Title Patient will report </= 4/10 pain level to reduce functional limitation    Time 4    Period Weeks    Status New    Target Date 10/11/19             PT Long Term Goals - 09/13/19 1638      PT LONG TERM GOAL #1   Title Patient will be I with final HEP to maintain progress with PT    Time 8    Period Weeks    Status New    Target Date 11/08/19      PT LONG TERM GOAL #2   Title Patient will report no limitation with walking or work related activities    Time 8    Period Weeks    Status New    Target Date 11/08/19      PT LONG TERM GOAL #3   Title Patient will exhibit normalized knee AROM to equal opposite side in order allow for improved movement quality    Time 8    Period Weeks      Status New    Target Date 11/08/19      PT LONG TERM GOAL #4   Title Patient will exhibit improved right knee strength to grossly = 5/5 MMT to improve stair negotiation and squatting    Time 8    Period Weeks    Status New    Target Date 11/08/19      PT LONG TERM GOAL #5   Title Patient will report </= 2/10 pain level with activity to be able to perform ADLs without limitation    Time 8    Period Weeks    Status New    Target Date 11/08/19                 Plan - 09/27/19 1708    Clinical Impression Statement Limited inferior glide of patella noted with singificant tightness of VL. Attempted low step today and worked on quick extension rather than compensating which she still reported pain.    PT Treatment/Interventions ADLs/Self Care Home Management;Cryotherapy;Electrical Stimulation;Moist Heat;Iontophoresis 4mg /ml Dexamethasone;Neuromuscular re-education;Balance training;Therapeutic exercise;Therapeutic activities;Functional mobility training;Stair training;Gait training;Patient/family education;Manual techniques;Dry needling;Passive range of motion;Taping;Vasopneumatic Device;Joint Manipulations    PT Next Visit Plan try steps again, outcome of IASTM & roller?    PT Home Exercise Plan [id number]    [id number] and Agree with Plan of Care Patient           Patient will benefit from skilled therapeutic intervention in order to improve the following deficits and impairments:  Abnormal gait, Decreased range of motion, Increased edema, Decreased strength, Pain, Decreased activity tolerance, Difficulty walking  Visit Diagnosis: Acute pain of right knee  Muscle weakness (generalized)  Localized edema     Problem List Patient Active Problem List   Diagnosis Date Noted  . Acute pain of right knee  08/20/2019  . Fatigue 03/28/2019  . Abnormal Pap smear of cervix 11/10/2018  . Preventative health care 11/04/2018  . Iron deficiency anemia 10/27/2018  . Lichen planus  46/05/7996  . Seborrheic keratoses 06/11/2017  . History of anaphylaxis 03/04/2017  . Vitamin B 12 deficiency 01/14/2016  . Unspecified sinusitis (chronic) 08/12/2012  . Back pain 01/13/2012  . Headache(784.0) 10/01/2010  . 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  . Breast cancer screening 09/13/2010  . Polyp of colon, adenomatous 01/29/2007  . GERD (gastroesophageal reflux disease) 01/29/2007  . Osteoarthritis, multiple sites 04/09/2006    Butch Otterson C. Haseeb Fiallos PT, DPT 09/27/19 5:10 PM    [place], [place], [address] Phone: [phone]   Fax:  [phone]  Name: Meghan [name] MRN: [id number] Date of Birth: 05-15-1960

## 2019-09-29 ENCOUNTER — Ambulatory Visit: Payer: 59 | Admitting: Physical Therapy

## 2019-09-30 ENCOUNTER — Telehealth: Payer: Self-pay

## 2019-09-30 NOTE — Telephone Encounter (Signed)
Yes, monthly - or when she shows up.

## 2019-09-30 NOTE — Telephone Encounter (Signed)
Attempted to contact patient to schedule. Please schedule a nurse visit for monthly b12.

## 2019-09-30 NOTE — Telephone Encounter (Signed)
Patient calls nurse line requesting B12 injection. Her last B12 injection was last month. Is she doing monthly? Please advise.

## 2019-10-04 ENCOUNTER — Other Ambulatory Visit: Payer: Self-pay

## 2019-10-04 ENCOUNTER — Ambulatory Visit (INDEPENDENT_AMBULATORY_CARE_PROVIDER_SITE_OTHER): Payer: 59

## 2019-10-04 ENCOUNTER — Ambulatory Visit: Payer: 59 | Admitting: Physical Therapy

## 2019-10-04 ENCOUNTER — Encounter: Payer: Self-pay | Admitting: Physical Therapy

## 2019-10-04 DIAGNOSIS — E538 Deficiency of other specified B group vitamins: Secondary | ICD-10-CM | POA: Diagnosis not present

## 2019-10-04 DIAGNOSIS — M6281 Muscle weakness (generalized): Secondary | ICD-10-CM

## 2019-10-04 DIAGNOSIS — M25561 Pain in right knee: Secondary | ICD-10-CM

## 2019-10-04 DIAGNOSIS — R600 Localized edema: Secondary | ICD-10-CM

## 2019-10-04 MED ORDER — CYANOCOBALAMIN 1000 MCG/ML IJ SOLN
1000.0000 ug | INTRAMUSCULAR | Status: AC
  Administered 2019-10-04 – 2020-05-08 (×7): 1000 ug via INTRAMUSCULAR

## 2019-10-04 NOTE — Progress Notes (Signed)
Pt is here for a b12 injection today.    Date of last office visit that b12 was discussed 08/19/2019  Last injection was 08/22/2019  Injection given in right deltoid, pt tolerated well and scheduled for next injection 11/11/2019.  Talbot Grumbling, RN

## 2019-10-05 ENCOUNTER — Other Ambulatory Visit: Payer: Self-pay

## 2019-10-05 ENCOUNTER — Ambulatory Visit: Payer: No Typology Code available for payment source

## 2019-10-05 NOTE — Therapy (Signed)
[place], [place], [address] Phone: [phone]   Fax:  [phone]  Physical Therapy Treatment  Patient Details  Name: Meghan [name] MRN: [id number] Date of Birth: Aug 04, 1960 Referring Provider (PT): McDiarmid, Blane Ohara, MD   Encounter Date: 10/04/2019   PT End of Session - 10/05/19 0818    Visit Number 5    Number of Visits 17    Date for PT Re-Evaluation 11/08/19    Authorization Type [employer]    PT Start Time 1630    PT Stop Time 1700    PT Time Calculation (min) 30 min    Activity Tolerance Patient tolerated treatment well    Behavior During Therapy [facility] for tasks assessed/performed           Past Medical History:  Diagnosis Date  . Angio-edema   . Asthma   . Lichen planus   . Malignant hyperthermia    1988, 1987  . Urticaria     Past Surgical History:  Procedure Laterality Date  . ADENOIDECTOMY    . ESOPHAGOGASTRODUODENOSCOPY (EGD) WITH PROPOFOL N/A 02/25/2019   Procedure: ESOPHAGOGASTRODUODENOSCOPY (EGD) WITH PROPOFOL;  Surgeon: Carol Ada, MD;  [facility];  Service: Endoscopy;  Laterality: N/A;  . HEMOSTASIS CLIP PLACEMENT  02/25/2019   Procedure: HEMOSTASIS CLIP PLACEMENT;  Surgeon: Carol Ada, MD;  [facility]: [facility];  Service: Endoscopy;;  . POLYPECTOMY  02/25/2019   Procedure: POLYPECTOMY;  Surgeon: Carol Ada, MD;  Location: [facility];  Service: Endoscopy;;  . TONSILLECTOMY  1987  . TUBAL LIGATION  1988  . UTERINE ARTERY EMBOLIZATION  2000   for fibroids     There were no vitals filed for this visit.   Subjective Assessment - 10/04/19 1633    Subjective Patient reports she is feeling better and she can tell the swelling is going down.    Patient Stated Goals Patient would like to get rid of the pain    Currently in Pain? Yes    Pain Score 4     Pain Location Knee    Pain Orientation Right    Pain Descriptors / Indicators Pressure     Pain Type Acute pain    Pain Onset More than a month ago    Pain Frequency Constant              [facility] PT Assessment - 10/05/19 0001      AROM   Right Knee Extension 0    Right Knee Flexion 118   tightness reported at end range                        [facility] Adult PT Treatment/Exercise - 10/05/19 0001      Exercises   Exercises Knee/Hip      Knee/Hip Exercises: Stretches   Sports administrator 2 reps;30 seconds    Quad Stretch Limitations prone    Hip Flexor Stretch 2 reps;30 seconds    Hip Flexor Stretch Limitations thomas edge of mat      Knee/Hip Exercises: Supine   Straight Leg Raises 2 sets;10 reps      Knee/Hip Exercises: Sidelying   Hip ABduction 2 sets;10 reps    Hip ABduction Limitations arcs      Manual Therapy   Manual Therapy Soft tissue mobilization;Taping;Joint mobilization    Joint Mobilization PF mobs all directions, tibiofemoral A/P mobs in supine    Soft tissue mobilization Roller to quad while patient  in [place] position edge of bed    Kinesiotex Edema      Kinesiotix   Edema Rt knee 2 strips                  PT Education - 10/05/19 0818    Education Details HEP    Person(s) Educated Patient    Methods Explanation    Comprehension Verbalized understanding            PT Short Term Goals - 09/13/19 1637      PT SHORT TERM GOAL #1   Title Patient will be I with initial HEP to progress with PT    Time 4    Period Weeks    Status New    Target Date 10/11/19      PT SHORT TERM GOAL #2   Title Patient will exhibit improvement in knee swelling to </= 41 cm to improve knee motion.    Time 4    Period Weeks    Status New    Target Date 10/11/19      PT SHORT TERM GOAL #3   Title Patient will exhibit improve knee motion >/= 110 deg knee flexion to improve gait    Time 4    Period Weeks    Status New    Target Date 10/11/19      PT SHORT TERM GOAL #4   Title Patient will report </= 4/10 pain level to reduce functional  limitation    Time 4    Period Weeks    Status New    Target Date 10/11/19             PT Long Term Goals - 09/13/19 1638      PT LONG TERM GOAL #1   Title Patient will be I with final HEP to maintain progress with PT    Time 8    Period Weeks    Status New    Target Date 11/08/19      PT LONG TERM GOAL #2   Title Patient will report no limitation with walking or work related activities    Time 8    Period Weeks    Status New    Target Date 11/08/19      PT LONG TERM GOAL #3   Title Patient will exhibit normalized knee AROM to equal opposite side in order allow for improved movement quality    Time 8    Period Weeks    Status New    Target Date 11/08/19      PT LONG TERM GOAL #4   Title Patient will exhibit improved right knee strength to grossly = 5/5 MMT to improve stair negotiation and squatting    Time 8    Period Weeks    Status New    Target Date 11/08/19      PT LONG TERM GOAL #5   Title Patient will report </= 2/10 pain level with activity to be able to perform ADLs without limitation    Time 8    Period Weeks    Status New    Target Date 11/08/19                 Plan - 10/05/19 0819    Clinical Impression Statement Therapy limited secondary to patient arriving late. Continue with taping for swelling as patient reports this is helping and performed manual to improve knee motion. Patient does exhibit improved knee flexion but continues to report  tightness at end range. She would benefit from continued skilled PT to improve motion and strength to maximize functional level.    PT Treatment/Interventions ADLs/Self Care Home Management;Cryotherapy;Electrical Stimulation;Moist Heat;Iontophoresis 4mg /ml Dexamethasone;Neuromuscular re-education;Balance training;Therapeutic exercise;Therapeutic activities;Functional mobility training;Stair training;Gait training;Patient/family education;Manual techniques;Dry needling;Passive range of  motion;Taping;Vasopneumatic Device;Joint Manipulations    PT Next Visit Plan Continue tape, manual, progress strengthening    PT Home Exercise Plan [email or web address]    Consulted and Agree with Plan of Care Patient           Patient will benefit from skilled therapeutic intervention in order to improve the following deficits and impairments:  Abnormal gait, Decreased range of motion, Increased edema, Decreased strength, Pain, Decreased activity tolerance, Difficulty walking  Visit Diagnosis: Acute pain of right knee  Muscle weakness (generalized)  Localized edema     Problem List Patient Active Problem List   Diagnosis Date Noted  . Acute pain of right knee 08/20/2019  . Fatigue 03/28/2019  . Abnormal Pap smear of cervix 11/10/2018  . Preventative health care 11/04/2018  . Iron deficiency anemia 10/27/2018  . Lichen planus 09/73/5329  . Seborrheic keratoses 06/11/2017  . History of anaphylaxis 03/04/2017  . Vitamin B 12 deficiency 01/14/2016  . Unspecified sinusitis (chronic) 08/12/2012  . Back pain 01/13/2012  . Headache(784.0) 10/01/2010  . 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  . Breast cancer screening 09/13/2010  . Polyp of colon, adenomatous 01/29/2007  . GERD (gastroesophageal reflux disease) 01/29/2007  . Osteoarthritis, multiple sites 04/09/2006    Hilda Blades, PT, DPT, LAT, ATC 10/05/19  8:21 AM Phone: [phone] Fax: [facility] [address] [place], [place], [address] Phone: [phone]   Fax:  [phone]  Name: Meghan [name] MRN: [id number] Date of Birth: [birth year]

## 2019-10-06 ENCOUNTER — Encounter: Payer: Self-pay | Admitting: Physical Therapy

## 2019-10-06 ENCOUNTER — Ambulatory Visit: Payer: 59 | Admitting: Physical Therapy

## 2019-10-06 ENCOUNTER — Other Ambulatory Visit: Payer: Self-pay

## 2019-10-06 DIAGNOSIS — M25561 Pain in right knee: Secondary | ICD-10-CM

## 2019-10-06 DIAGNOSIS — M6281 Muscle weakness (generalized): Secondary | ICD-10-CM

## 2019-10-06 DIAGNOSIS — R600 Localized edema: Secondary | ICD-10-CM

## 2019-10-06 NOTE — Therapy (Signed)
[place], [place], [address] Phone: [phone]   Fax:  [phone]  Physical Therapy Treatment  Patient Details  Name: Meghan [name] MRN: [id number] Date of Birth: [birth year] Referring Provider (PT): McDiarmid, Blane Ohara, MD   Encounter Date: 10/06/2019   PT End of Session - 10/06/19 1618    Visit Number 6    Number of Visits 17    Date for PT Re-Evaluation 11/08/19    Authorization Type [employer]    PT Start Time 1613    PT Stop Time 1655    PT Time Calculation (min) 42 min    Activity Tolerance Patient tolerated treatment well    Behavior During Therapy [facility] for tasks assessed/performed           Past Medical History:  Diagnosis Date  . Angio-edema   . Asthma   . Lichen planus   . Malignant hyperthermia    1988, 1987  . Urticaria     Past Surgical History:  Procedure Laterality Date  . ADENOIDECTOMY    . ESOPHAGOGASTRODUODENOSCOPY (EGD) WITH PROPOFOL N/A 02/25/2019   Procedure: ESOPHAGOGASTRODUODENOSCOPY (EGD) WITH PROPOFOL;  Surgeon: Carol Ada, MD;  Location: [facility];  Service: Endoscopy;  Laterality: N/A;  . HEMOSTASIS CLIP PLACEMENT  02/25/2019   Procedure: HEMOSTASIS CLIP PLACEMENT;  Surgeon: Carol Ada, MD;  Location: [facility];  Service: Endoscopy;;  . POLYPECTOMY  02/25/2019   Procedure: POLYPECTOMY;  Surgeon: Carol Ada, MD;  Location: [facility];  Service: Endoscopy;;  . TONSILLECTOMY  1987  . TUBAL LIGATION  1988  . UTERINE ARTERY EMBOLIZATION  2000   for fibroids     There were no vitals filed for this visit.   Subjective Assessment - 10/06/19 1613    Subjective Patient reports she is a little tight today, she has been up on it quite a bit and she forgot to wear her brace.    Patient Stated Goals Patient would like to get rid of the pain    Currently in Pain? Yes    Pain Score 6     Pain Location Knee    Pain Orientation Right    Pain Descriptors  / Indicators Other (Comment)   "pinch"   Pain Type Acute pain    Pain Onset More than a month ago    Pain Frequency Intermittent    Aggravating Factors  Bending the knee              [facility] PT Assessment - 10/06/19 0001      AROM   Right Knee Flexion 120                         [facility] Adult PT Treatment/Exercise - 10/06/19 0001      Exercises   Exercises Knee/Hip      Knee/Hip Exercises: Stretches   Hip Flexor Stretch 2 reps;30 seconds    Hip Flexor Stretch Limitations thomas edge of mat      Knee/Hip Exercises: Aerobic   Recumbent Bike L1 x 5 min      Knee/Hip Exercises: Standing   Step Down 2 sets;5 reps    Step Down Limitations lateral heel tap on 2" step - focus on slow eccenctric control      Knee/Hip Exercises: Supine   Straight Leg Raises 10 reps    Straight Leg Raises Limitations focus on quad set      Knee/Hip Exercises: Sidelying  Hip ABduction 2 sets;10 reps    Hip ABduction Limitations arcs      Manual Therapy   Manual Therapy Soft tissue mobilization;Taping;Joint mobilization    Joint Mobilization PF mobs all directions, tibiofemoral A/P mobs    Soft tissue mobilization Roller to quad while patient in thomas position edge of bed    Kinesiotex Edema      Kinesiotix   Edema Right knee 2 strips                  PT Education - 10/06/19 1617    Education Details HEP    Person(s) Educated Patient    Methods Explanation    Comprehension Verbalized understanding;Need further instruction            PT Short Term Goals - 09/13/19 1637      PT SHORT TERM GOAL #1   Title Patient will be I with initial HEP to progress with PT    Time 4    Period Weeks    Status New    Target Date 10/11/19      PT SHORT TERM GOAL #2   Title Patient will exhibit improvement in knee swelling to </= 41 cm to improve knee motion.    Time 4    Period Weeks    Status New    Target Date 10/11/19      PT SHORT TERM GOAL #3   Title Patient  will exhibit improve knee motion >/= 110 deg knee flexion to improve gait    Time 4    Period Weeks    Status New    Target Date 10/11/19      PT SHORT TERM GOAL #4   Title Patient will report </= 4/10 pain level to reduce functional limitation    Time 4    Period Weeks    Status New    Target Date 10/11/19             PT Long Term Goals - 09/13/19 1638      PT LONG TERM GOAL #1   Title Patient will be I with final HEP to maintain progress with PT    Time 8    Period Weeks    Status New    Target Date 11/08/19      PT LONG TERM GOAL #2   Title Patient will report no limitation with walking or work related activities    Time 8    Period Weeks    Status New    Target Date 11/08/19      PT LONG TERM GOAL #3   Title Patient will exhibit normalized knee AROM to equal opposite side in order allow for improved movement quality    Time 8    Period Weeks    Status New    Target Date 11/08/19      PT LONG TERM GOAL #4   Title Patient will exhibit improved right knee strength to grossly = 5/5 MMT to improve stair negotiation and squatting    Time 8    Period Weeks    Status New    Target Date 11/08/19      PT LONG TERM GOAL #5   Title Patient will report </= 2/10 pain level with activity to be able to perform ADLs without limitation    Time 8    Period Weeks    Status New    Target Date 11/08/19  Plan - 10/06/19 1618    Clinical Impression Statement Patient tolerated therapy well with no adverse effects. She continues to exhibit improved knee flexion and is tolerating strengthening exercises well. Swelling still present so continued to use K-tape for edema control as patient reports this is helpful. She does continue to have tightness at end ranges of motion and exhibits quad weakness/fatigue with exercise and activity. She would benefit from continued skilled PT to improve motion and strength to maximize functional level.    PT  Treatment/Interventions ADLs/Self Care Home Management;Cryotherapy;Electrical Stimulation;Moist Heat;Iontophoresis 4mg /ml Dexamethasone;Neuromuscular re-education;Balance training;Therapeutic exercise;Therapeutic activities;Functional mobility training;Stair training;Gait training;Patient/family education;Manual techniques;Dry needling;Passive range of motion;Taping;Vasopneumatic Device;Joint Manipulations    PT Next Visit Plan Continue tape, manual, progress strengthening    PT Home Exercise Plan [email or web address]    Consulted and Agree with Plan of Care Patient           Patient will benefit from skilled therapeutic intervention in order to improve the following deficits and impairments:  Abnormal gait, Decreased range of motion, Increased edema, Decreased strength, Pain, Decreased activity tolerance, Difficulty walking  Visit Diagnosis: Acute pain of right knee  Muscle weakness (generalized)  Localized edema     Problem List Patient Active Problem List   Diagnosis Date Noted  . Acute pain of right knee 08/20/2019  . Fatigue 03/28/2019  . Abnormal Pap smear of cervix 11/10/2018  . Preventative health care 11/04/2018  . Iron deficiency anemia 10/27/2018  . Lichen planus 67/28/9791  . Seborrheic keratoses 06/11/2017  . History of anaphylaxis 03/04/2017  . Vitamin B 12 deficiency 01/14/2016  . Unspecified sinusitis (chronic) 08/12/2012  . Back pain 01/13/2012  . Headache(784.0) 10/01/2010  . 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  . Breast cancer screening 09/13/2010  . Polyp of colon, adenomatous 01/29/2007  . GERD (gastroesophageal reflux disease) 01/29/2007  . Osteoarthritis, multiple sites 04/09/2006    Hilda Blades, PT, DPT, LAT, ATC 10/06/19  5:03 PM Phone: [phone] Fax: [facility] [address] [place], [place], [address] Phone: [phone]   Fax:  [phone]  Name: Meghan [name] MRN:  [id number] Date of Birth: [birth year]

## 2019-10-11 ENCOUNTER — Ambulatory Visit: Payer: 59 | Admitting: Physical Therapy

## 2019-10-12 ENCOUNTER — Telehealth: Payer: Self-pay | Admitting: Physical Therapy

## 2019-10-12 NOTE — Telephone Encounter (Signed)
Contacted patient due to missed PT appointment. She states that she forgot to call to cancel the appointment. Patient was reminded of next scheduled PT appointment and informed of attendance policy. Patient expressed understanding.   Hilda Blades, PT, DPT, LAT, ATC 10/12/19  11:10 AM Phone: [phone] Fax: [phone]

## 2019-10-13 ENCOUNTER — Other Ambulatory Visit: Payer: Self-pay

## 2019-10-13 ENCOUNTER — Encounter: Payer: Self-pay | Admitting: Physical Therapy

## 2019-10-13 ENCOUNTER — Ambulatory Visit: Payer: 59 | Attending: Family Medicine | Admitting: Physical Therapy

## 2019-10-13 DIAGNOSIS — M25561 Pain in right knee: Secondary | ICD-10-CM | POA: Diagnosis not present

## 2019-10-13 DIAGNOSIS — R600 Localized edema: Secondary | ICD-10-CM | POA: Diagnosis present

## 2019-10-13 DIAGNOSIS — M6281 Muscle weakness (generalized): Secondary | ICD-10-CM | POA: Insufficient documentation

## 2019-10-13 NOTE — Therapy (Signed)
[name], [place], [address] Phone: [phone]   Fax:  [phone]  Physical Therapy Treatment  Patient Details  Name: Meghan [name] MRN: [id number] Date of Birth: November 12, 1960 Referring Provider (PT): McDiarmid, Blane Ohara, MD   Encounter Date: 10/13/2019   PT End of Session - 10/13/19 1622    Visit Number 7    Number of Visits 17    Date for PT Re-Evaluation 11/08/19    Authorization Type [employer]    PT Start Time 2458    PT Stop Time 1658    PT Time Calculation (min) 41 min    Activity Tolerance Patient tolerated treatment well    Behavior During Therapy [facility] for tasks assessed/performed           Past Medical History:  Diagnosis Date  . Angio-edema   . Asthma   . Lichen planus   . Malignant hyperthermia    1988, 1987  . Urticaria     Past Surgical History:  Procedure Laterality Date  . ADENOIDECTOMY    . ESOPHAGOGASTRODUODENOSCOPY (EGD) WITH PROPOFOL N/A 02/25/2019   Procedure: ESOPHAGOGASTRODUODENOSCOPY (EGD) WITH PROPOFOL;  Surgeon: Carol Ada, MD;  [facility];  Service: Endoscopy;  Laterality: N/A;  . HEMOSTASIS CLIP PLACEMENT  02/25/2019   Procedure: HEMOSTASIS CLIP PLACEMENT;  Surgeon: Carol Ada, MD;  [facility];  Service: Endoscopy;;  . POLYPECTOMY  02/25/2019   Procedure: POLYPECTOMY;  Surgeon: Carol Ada, MD;  [facility];  Service: Endoscopy;;  . TONSILLECTOMY  1987  . TUBAL LIGATION  1988  . UTERINE ARTERY EMBOLIZATION  2000   for fibroids     There were no vitals filed for this visit.   Subjective Assessment - 10/13/19 1620    Subjective Patient reports she is doing well. States the knee is still a little tight with some pinching when she bends, but states it is still continuing to loosen up.    Patient Stated Goals Patient would like to get rid of the pain    Currently in Pain? Yes    Pain Score 5     Pain Location Knee     Pain Orientation Right    Pain Descriptors / Indicators Tightness    Pain Type Acute pain    Pain Onset More than a month ago    Pain Frequency Intermittent    Aggravating Factors  Bending the knee              [facility] PT Assessment - 10/13/19 0001      Circumferential Edema   Circumferential - Right 43 cm    Circumferential - Left  40 cm      AROM   Right Knee Extension 0    Right Knee Flexion 117                         [facility] Adult PT Treatment/Exercise - 10/13/19 0001      Exercises   Exercises Knee/Hip      Knee/Hip Exercises: Stretches   Hip Flexor Stretch 2 reps;30 seconds    Hip Flexor Stretch Limitations thomas edge of mat      Knee/Hip Exercises: Aerobic   Recumbent Bike L1 x 4 min      Knee/Hip Exercises: Standing   Step Down 2 sets;5 reps    Step Down Limitations lateral heel tap on 2" step - focus on slow eccenctric control  Knee/Hip Exercises: Seated   Sit to Sand 10 reps   2 sets     Knee/Hip Exercises: Supine   Quad Sets 5 reps   3 seconds   Straight Leg Raises 2 sets;10 reps    Straight Leg Raises Limitations focus on quad set, partial range due to anterior knee pain      Manual Therapy   Manual Therapy Soft tissue mobilization;Taping;Joint mobilization;Passive ROM    Joint Mobilization PF mobs all directions, tibiofemoral A/P mobs    Soft tissue mobilization Roller to quad while patient in thomas position edge of bed    Passive ROM Knee flexion to tolerance    Kinesiotex Edema      Kinesiotix   Edema Right knee 2 strips                  PT Education - 10/13/19 1622    Education Details HEP    Person(s) Educated Patient    Methods Explanation    Comprehension Verbalized understanding;Need further instruction            PT Short Term Goals - 10/13/19 1623      PT SHORT TERM GOAL #1   Title Patient will be I with initial HEP to progress with PT    Time 4    Period Weeks    Status On-going    Target Date  10/11/19      PT SHORT TERM GOAL #2   Title Patient will exhibit improvement in knee swelling to </= 41 cm to improve knee motion.    Time 4    Period Weeks    Status On-going    Target Date 10/11/19      PT SHORT TERM GOAL #3   Title Patient will exhibit improve knee motion >/= 110 deg knee flexion to improve gait    Time 4    Period Weeks    Status Achieved    Target Date 10/11/19      PT SHORT TERM GOAL #4   Title Patient will report </= 4/10 pain level to reduce functional limitation    Time 4    Period Weeks    Status On-going    Target Date 10/11/19             PT Long Term Goals - 09/13/19 1638      PT LONG TERM GOAL #1   Title Patient will be I with final HEP to maintain progress with PT    Time 8    Period Weeks    Status New    Target Date 11/08/19      PT LONG TERM GOAL #2   Title Patient will report no limitation with walking or work related activities    Time 8    Period Weeks    Status New    Target Date 11/08/19      PT LONG TERM GOAL #3   Title Patient will exhibit normalized knee AROM to equal opposite side in order allow for improved movement quality    Time 8    Period Weeks    Status New    Target Date 11/08/19      PT LONG TERM GOAL #4   Title Patient will exhibit improved right knee strength to grossly = 5/5 MMT to improve stair negotiation and squatting    Time 8    Period Weeks    Status New    Target Date 11/08/19  PT LONG TERM GOAL #5   Title Patient will report </= 2/10 pain level with activity to be able to perform ADLs without limitation    Time 8    Period Weeks    Status New    Target Date 11/08/19                 Plan - 10/13/19 1623    Clinical Impression Statement Patient tolerated therapy well with no adverse effects. She exhibits improvement in motion and strength compared to her evaluation but she continues to be limited and also with persistent swelling. Continued therapy focus on improving knee  motion and progressing quad strengthening. She reports anterior knee pain and medial joint line pinching with knee extension, possibly meniscal related vs OA. Continued with taping for edema as patient reports this is beneficial. She would benefit from continued skilled PT to improve motion and strength to maximize functional level.    PT Treatment/Interventions ADLs/Self Care Home Management;Cryotherapy;Electrical Stimulation;Moist Heat;Iontophoresis 4mg /ml Dexamethasone;Neuromuscular re-education;Balance training;Therapeutic exercise;Therapeutic activities;Functional mobility training;Stair training;Gait training;Patient/family education;Manual techniques;Dry needling;Passive range of motion;Taping;Vasopneumatic Device;Joint Manipulations    PT Next Visit Plan Continue tape, manual, progress strengthening    PT Home Exercise Plan [id number]    Consulted and Agree with Plan of Care Patient           Patient will benefit from skilled therapeutic intervention in order to improve the following deficits and impairments:  Abnormal gait, Decreased range of motion, Increased edema, Decreased strength, Pain, Decreased activity tolerance, Difficulty walking  Visit Diagnosis: Acute pain of right knee  Muscle weakness (generalized)  Localized edema     Problem List Patient Active Problem List   Diagnosis Date Noted  . Acute pain of right knee 08/20/2019  . Fatigue 03/28/2019  . Abnormal Pap smear of cervix 11/10/2018  . Preventative health care 11/04/2018  . Iron deficiency anemia 10/27/2018  . Lichen planus 54/10/8117  . Seborrheic keratoses 06/11/2017  . History of anaphylaxis 03/04/2017  . Vitamin B 12 deficiency 01/14/2016  . Unspecified sinusitis (chronic) 08/12/2012  . Back pain 01/13/2012  . Headache(784.0) 10/01/2010  . 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  . Breast cancer screening 09/13/2010  . Polyp of colon, adenomatous 01/29/2007  . GERD (gastroesophageal reflux disease)  01/29/2007  . Osteoarthritis, multiple sites 04/09/2006    Hilda Blades, PT, DPT, LAT, ATC 10/13/19  5:11 PM Phone: [phone] Fax: [place] [facility] [address] [place], [place], [address] Phone: [phone]   Fax:  [phone]  Name: RALYNN SAN MRN: [id number] Date of Birth: 1960-07-01

## 2019-10-19 ENCOUNTER — Telehealth: Payer: Self-pay

## 2019-10-19 ENCOUNTER — Ambulatory Visit: Payer: No Typology Code available for payment source | Admitting: Family Medicine

## 2019-10-19 NOTE — Telephone Encounter (Signed)
Patient calls nurse line requesting Quantiferon TB results. Informed patient that last documented results for this test from our office was 12/08/2017.   Patient states that she will check with employer for more recent results and will call office back if she needs additional lab work.   Talbot Grumbling, RN

## 2019-10-25 ENCOUNTER — Ambulatory Visit: Payer: 59 | Admitting: Physical Therapy

## 2019-10-26 ENCOUNTER — Other Ambulatory Visit: Payer: Self-pay

## 2019-10-26 ENCOUNTER — Ambulatory Visit: Payer: 59

## 2019-10-26 DIAGNOSIS — M25561 Pain in right knee: Secondary | ICD-10-CM

## 2019-10-26 DIAGNOSIS — M6281 Muscle weakness (generalized): Secondary | ICD-10-CM

## 2019-10-26 DIAGNOSIS — R600 Localized edema: Secondary | ICD-10-CM

## 2019-10-26 NOTE — Therapy (Signed)
[place], [place], [address] Phone: [phone]   Fax:  [phone]  Physical Therapy Treatment  Patient Details  Name: Meghan [name] MRN: [id number] Date of Birth: [birth year] Referring Provider (PT): McDiarmid, Blane Ohara, MD   Encounter Date: 10/26/2019   PT End of Session - 10/26/19 1927    Visit Number 8    Number of Visits 17    Date for PT Re-Evaluation 11/08/19    Authorization Type [employer]    PT Start Time Kathaleen Maser    PT Stop Time 1925    PT Time Calculation (min) 50 min    Activity Tolerance Patient tolerated treatment well    Behavior During Therapy [facility] for tasks assessed/performed           Past Medical History:  Diagnosis Date  . Angio-edema   . Asthma   . Lichen planus   . Malignant hyperthermia    1988, 1987  . Urticaria     Past Surgical History:  Procedure Laterality Date  . ADENOIDECTOMY    . ESOPHAGOGASTRODUODENOSCOPY (EGD) WITH PROPOFOL N/A 02/25/2019   Procedure: ESOPHAGOGASTRODUODENOSCOPY (EGD) WITH PROPOFOL;  Surgeon: Carol Ada, MD;  [facility];  Service: Endoscopy;  Laterality: N/A;  . HEMOSTASIS CLIP PLACEMENT  02/25/2019   Procedure: HEMOSTASIS CLIP PLACEMENT;  Surgeon: Carol Ada, MD;  [facility];  Service: Endoscopy;;  . POLYPECTOMY  02/25/2019   Procedure: POLYPECTOMY;  Surgeon: Carol Ada, MD;  [facility];  Service: Endoscopy;;  . TONSILLECTOMY  1987  . TUBAL LIGATION  1988  . UTERINE ARTERY EMBOLIZATION  2000   for fibroids     There were no vitals filed for this visit.   Subjective Assessment - 10/26/19 1840    Subjective Pt reports the swelling is better. She still is experiencing Intermittent pinches of her R knee along the lateral aspect of the patella.    Currently in Pain? Yes    Pain Score 5     Pain Location Knee    Pain Orientation Right    Pain Descriptors / Indicators Tightness;Other (Comment)    pinch   Pain Type Acute pain    Pain Onset More than a month ago    Pain Frequency Intermittent    Aggravating Factors  Bending the knee    Pain Relieving Factors Elevating the leg    Effect of Pain on Daily Activities Patient limited with weight bearing tasks                             [facility] Adult PT Treatment/Exercise - 10/26/19 0001      Knee/Hip Exercises: Stretches   Hip Flexor Stretch 2 reps;30 seconds    Hip Flexor Stretch Limitations thomas edge of mat      Knee/Hip Exercises: Aerobic   Nustep 5 mins; L1: arms and legs      Knee/Hip Exercises: Seated   Sit to [employer] 10 reps      Knee/Hip Exercises: Supine   Quad Sets 5 reps   3 seconds   Straight Leg Raises 2 sets;10 reps    Straight Leg Raises Limitations focus on quad set, partial range due to anterior knee pain      Knee/Hip Exercises: Sidelying   Hip ABduction 2 sets;10 reps    Hip ABduction Limitations arcs      Manual Therapy   Manual Therapy Soft tissue mobilization;Taping;Joint mobilization;Passive ROM  Joint Mobilization PF mobs all directions, tibiofemoral A/P mobs    Soft tissue mobilization Roller to quad while patient in thomas position edge of bed    Passive ROM Knee flexion to tolerance    Kinesiotex Edema      Kinesiotix   Edema Right knee 2 strips                    PT Short Term Goals - 10/13/19 1623      PT SHORT TERM GOAL #1   Title Patient will be I with initial HEP to progress with PT    Time 4    Period Weeks    Status On-going    Target Date 10/11/19      PT SHORT TERM GOAL #2   Title Patient will exhibit improvement in knee swelling to </= 41 cm to improve knee motion.    Time 4    Period Weeks    Status On-going    Target Date 10/11/19      PT SHORT TERM GOAL #3   Title Patient will exhibit improve knee motion >/= 110 deg knee flexion to improve gait    Time 4    Period Weeks    Status Achieved    Target Date 10/11/19      PT SHORT TERM  GOAL #4   Title Patient will report </= 4/10 pain level to reduce functional limitation    Time 4    Period Weeks    Status On-going    Target Date 10/11/19             PT Long Term Goals - 09/13/19 1638      PT LONG TERM GOAL #1   Title Patient will be I with final HEP to maintain progress with PT    Time 8    Period Weeks    Status New    Target Date 11/08/19      PT LONG TERM GOAL #2   Title Patient will report no limitation with walking or work related activities    Time 8    Period Weeks    Status New    Target Date 11/08/19      PT LONG TERM GOAL #3   Title Patient will exhibit normalized knee AROM to equal opposite side in order allow for improved movement quality    Time 8    Period Weeks    Status New    Target Date 11/08/19      PT LONG TERM GOAL #4   Title Patient will exhibit improved right knee strength to grossly = 5/5 MMT to improve stair negotiation and squatting    Time 8    Period Weeks    Status New    Target Date 11/08/19      PT LONG TERM GOAL #5   Title Patient will report </= 2/10 pain level with activity to be able to perform ADLs without limitation    Time 8    Period Weeks    Status New    Target Date 11/08/19                 Plan - 10/26/19 1933    Clinical Impression Statement Pt focused on strengthening of the R LE with both open and closed kinetic chain therex, ROM of the R knee, and edema management. Pt participated with good effort with tolerating joint line and anterior knee pain. Edema is noted of the medial  and alteral joint spaces.    Personal Factors and Comorbidities Time since onset of injury/illness/exacerbation;Fitness;Profession    Examination-Activity Limitations Bend;Stand;Stairs;Squat;Sit;Locomotion Level    Examination-Participation Restrictions Cleaning;Driving;Meal Prep;Occupation;Shop;Yard Work    Stability/Clinical Decision Making Stable/Uncomplicated    Rehab Potential Good    PT Frequency 2x / week      PT Duration 8 weeks    PT Treatment/Interventions ADLs/Self Care Home Management;Cryotherapy;Electrical Stimulation;Moist Heat;Iontophoresis 4mg /ml Dexamethasone;Neuromuscular re-education;Balance training;Therapeutic exercise;Therapeutic activities;Functional mobility training;Stair training;Gait training;Patient/family education;Manual techniques;Dry needling;Passive range of motion;Taping;Vasopneumatic Device;Joint Manipulations    PT Next Visit Plan Continue tape, manual, progress strengthening    PT Home Exercise Plan [id number]    Consulted and Agree with Plan of Care Patient           Patient will benefit from skilled therapeutic intervention in order to improve the following deficits and impairments:  Abnormal gait, Decreased range of motion, Increased edema, Decreased strength, Pain, Decreased activity tolerance, Difficulty walking  Visit Diagnosis: Acute pain of right knee  Muscle weakness (generalized)  Localized edema     Problem List Patient Active Problem List   Diagnosis Date Noted  . Acute pain of right knee 08/20/2019  . Fatigue 03/28/2019  . Abnormal Pap smear of cervix 11/10/2018  . Preventative health care 11/04/2018  . Iron deficiency anemia 10/27/2018  . Lichen planus 92/92/4462  . Seborrheic keratoses 06/11/2017  . History of anaphylaxis 03/04/2017  . Vitamin B 12 deficiency 01/14/2016  . Unspecified sinusitis (chronic) 08/12/2012  . Back pain 01/13/2012  . Headache(784.0) 10/01/2010  . 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  . Breast cancer screening 09/13/2010  . Polyp of colon, adenomatous 01/29/2007  . GERD (gastroesophageal reflux disease) 01/29/2007  . Osteoarthritis, multiple sites 04/09/2006   Gar Ponto MS, PT 10/26/19 7:39 PM  [place] [facility] [address] [place], [place], [address] Phone: [phone]   Fax:  [phone]  Name: CANDIA KINGSBURY MRN: [id number] Date of Birth:  09/14/1960

## 2019-10-27 ENCOUNTER — Ambulatory Visit: Payer: 59 | Admitting: Physical Therapy

## 2019-11-01 ENCOUNTER — Telehealth: Payer: Self-pay | Admitting: Family Medicine

## 2019-11-01 NOTE — Telephone Encounter (Signed)
Patient called the after-hours line due to the new onset back spasms.  She reports that she has had problems with back spasms in the past and she knows exactly what they are.  She reports the pain radiates up and down her back and over her shoulders.  Does not radiate down her arms.  Denies any chest pain.  Does acknowledge shortness of breath when her back is spasming because she reports she is holding her breath through the pain.  She has taken 200 mg ibuprofen and has had an ice pack on her back with mild relief.  Patient was contemplating calling 911 to go be treated but does not want to have to go to the emergency department at this time.  I offered patient an appointment tomorrow and she would like that.  Appointment scheduled with Dr. Higinio Plan at 8:30 AM.  Strict ED precautions were discussed with the patient regarding shortness of breath, chest pain, pain radiating down her arms, nausea, vomiting.  Patient voiced clear understanding and reported if she got any worse she would call 911.

## 2019-11-02 ENCOUNTER — Ambulatory Visit: Payer: 59 | Admitting: Family Medicine

## 2019-11-02 ENCOUNTER — Encounter: Payer: Self-pay | Admitting: Family Medicine

## 2019-11-02 ENCOUNTER — Ambulatory Visit (INDEPENDENT_AMBULATORY_CARE_PROVIDER_SITE_OTHER): Payer: 59 | Admitting: Family Medicine

## 2019-11-02 ENCOUNTER — Other Ambulatory Visit: Payer: Self-pay

## 2019-11-02 VITALS — BP 162/99 | HR 57 | Ht 66.5 in | Wt 188.8 lb

## 2019-11-02 DIAGNOSIS — M549 Dorsalgia, unspecified: Secondary | ICD-10-CM | POA: Diagnosis not present

## 2019-11-02 DIAGNOSIS — M62838 Other muscle spasm: Secondary | ICD-10-CM | POA: Diagnosis not present

## 2019-11-02 MED ORDER — KETOROLAC TROMETHAMINE 30 MG/ML IJ SOLN
30.0000 mg | Freq: Once | INTRAMUSCULAR | Status: AC
  Administered 2019-11-02: 30 mg via INTRAMUSCULAR

## 2019-11-02 MED ORDER — MELOXICAM 15 MG PO TABS: 15.0000 mg | ORAL_TABLET | Freq: Every day | ORAL | 0 refills | Status: DC

## 2019-11-02 MED ORDER — CYCLOBENZAPRINE HCL 10 MG PO TABS: 10.0000 mg | ORAL_TABLET | Freq: Three times a day (TID) | ORAL | 0 refills | Status: DC | PRN

## 2019-11-02 NOTE — Progress Notes (Signed)
    SUBJECTIVE:   CHIEF COMPLAINT / HPI: Muscle spasms  Meghan [name] is a 59 year old female presenting discussed the following:  Spasms: Present on upper/middle back and upper shoulders since yesterday evening.  She has had this before and similar to previous episodes.  She had a flareup with spasms few months ago that resolved on their own, otherwise had not been this bad for several years.  Flexeril helped at that time.  Reports she was bringing in her groceries and then it "hit her ".  No preceding trauma or injury.  She denies any numbness/tingling, weakness, bowel/bladder changes.  Do the spasms feels like she is curling forward.  She has had some improvement with ibuprofen and a warm bath last night.  Currently being treated for iron and B12 deficiency, labs just checked 2 months ago.  PERTINENT  PMH / PSH: GERD, multiple sites of osteoarthritis, iron/B12 deficiency, history of back pain  OBJECTIVE:   BP (!) 162/99   Pulse (!) 57   Ht 5' 6.5" (1.689 m)   Wt 188 lb 12.8 oz (85.6 kg)   SpO2 100%   BMI 30.02 kg/m   General: Alert, NAD HEENT: NCAT, MMM Lungs: No increased WOB  Neck/Back: - Inspection: no gross deformity or asymmetry, swelling or ecchymosis - Palpation: Very tender to palpation diffusely throughout paracervical and thoracic musculature, bilateral trapezius, and bilateral arms.  - ROM: Limited active ROM through cervical and thoracic spine and shoulder joints bilaterally due to pain.  - Strength: 5/5 strength in biceps flexion/triceps extension, additional testing limited due to spasm/pain.  - Neuro: sensation intact in the C5-C8 nerve root distribution b/l, 2/4 biceps reflexes bilaterally. Can walk with normal stride, however slower gait and bent over - Special testing: Negative Spurling's bilaterally.  ASSESSMENT/PLAN:   Muscle spasms of neck Acute and debilitating for patient, atraumatic.  Unclear etiology, similar to previous episodes.  Reassuringly appears  neurologically intact, however limited physical exam due to spasms with associated pain.  Will provide symptomatic management with Flexeril PRN and meloxicam daily, given Toradol injection during evaluation today.  Recommended follow-up in 2 days to reassess for more in-depth physical exam as hopefully spasms will be significantly improved. Could assess with BMP/CBC on next visit, but presentation appears most consistent with MSK related.    Follow-up in about 2 days as discussed above or sooner if any associated numbness/tingling, weakness, changes in bowel/bladder function, or inability to maintain pain control.  Patriciaann Clan, [place]

## 2019-11-02 NOTE — Patient Instructions (Signed)
It was wonderful to see you today.  I am so sorry you are in so much discomfort.  We have given you a pain/anti-inflammatory injection in the office today, you can start taking the Mobic tomorrow.  I have also sent in a antispasm medication to help, do not use this medication if you are going to be driving soon as it can make you drowsy.  Do not take any ibuprofen with the Mobic, however you can take Tylenol 650 mg every 4-6 hours in addition to the Mobic.  Try to use heat on your back and shoulders at 20 minutes at a time several times a day.  Be sure that you are drinking plenty of water to stay hydrated.  Follow-up in our office on Friday or early next week for a reevaluation.

## 2019-11-02 NOTE — Progress Notes (Deleted)
    SUBJECTIVE:   CHIEF COMPLAINT / HPI: Muscle spasms  Meghan [name] is a 59 year old female presenting to discuss the following:  Spasms: Called into the after-hours line last night due to new back spasms.  She has had this several times before and similar to previous episodes.  PERTINENT  PMH / PSH: Malignant hyperthermia, iron deficiency/B12 anemia, multiple sites of osteoarthritis  OBJECTIVE:   There were no vitals taken for this visit.  ***  ASSESSMENT/PLAN:   No problem-specific Assessment & Plan notes found for this encounter.     Patriciaann Clan, [place]

## 2019-11-06 ENCOUNTER — Encounter: Payer: Self-pay | Admitting: Family Medicine

## 2019-11-06 DIAGNOSIS — M62838 Other muscle spasm: Secondary | ICD-10-CM | POA: Insufficient documentation

## 2019-11-06 NOTE — Assessment & Plan Note (Addendum)
Acute and debilitating for patient, atraumatic.  Unclear etiology, similar to previous episodes.  Reassuringly appears neurologically intact, however limited physical exam due to spasms with associated pain.  Will provide symptomatic management with Flexeril PRN and meloxicam daily, given Toradol injection during evaluation today.  Recommended follow-up in 2 days to reassess for more in-depth physical exam as hopefully spasms will be significantly improved. Could assess with BMP/CBC on next visit, but presentation appears most consistent with MSK related.

## 2019-11-07 ENCOUNTER — Other Ambulatory Visit: Payer: Self-pay

## 2019-11-07 ENCOUNTER — Ambulatory Visit (INDEPENDENT_AMBULATORY_CARE_PROVIDER_SITE_OTHER): Payer: 59 | Admitting: Family Medicine

## 2019-11-07 ENCOUNTER — Encounter: Payer: Self-pay | Admitting: Family Medicine

## 2019-11-07 VITALS — BP 145/80 | HR 78 | Wt 187.2 lb

## 2019-11-07 DIAGNOSIS — M62838 Other muscle spasm: Secondary | ICD-10-CM

## 2019-11-07 DIAGNOSIS — D509 Iron deficiency anemia, unspecified: Secondary | ICD-10-CM | POA: Diagnosis not present

## 2019-11-07 DIAGNOSIS — R030 Elevated blood-pressure reading, without diagnosis of hypertension: Secondary | ICD-10-CM | POA: Diagnosis not present

## 2019-11-07 DIAGNOSIS — R5383 Other fatigue: Secondary | ICD-10-CM

## 2019-11-07 MED ORDER — BLOOD PRESSURE CUFF MISC: 0 refills | Status: DC

## 2019-11-07 NOTE — Patient Instructions (Signed)
It was wonderful seeing you today.  I am so glad to hear that your muscle spasms have gotten better.  You can continue the meloxicam through the course and use your Flexeril as needed.  I also encourage you to continue using the heating pads and massage would likely be very beneficial.  If it is not significantly proving in the next several weeks we can consider getting x-rays of your back.  We will be getting labs today.  I have also printed her prescription to get a blood pressure cuff.  If it can be covered, I would like you to start checking her blood pressure about 3 times a week and keeping it in a journal to bring to your follow-up visit.  If you have difficulties getting a blood pressure cuff, please call in and let me know and we can see if we can get you blood pressure monitoring through our pharmacy clinic here.  Please follow-up with your journal with Dr. Andria Frames in the next 1 month or sooner if needed.

## 2019-11-07 NOTE — Assessment & Plan Note (Signed)
Started iron supplementation in addition to B12 injections in 08/2019.  Will recheck CBC today.

## 2019-11-07 NOTE — Assessment & Plan Note (Addendum)
Substantial improvement since visit on 9/22.  Mild residual soreness of bilateral rhomboid musculature, L>R, otherwise neurologically intact with appropriate strength throughout.  Recommended completing meloxicam course, Flexeril as needed, heat, and massage. ROM stretching as tolerated. If continued spasm presence on follow-up, consider obtaining a cervical/thoracic XRs.

## 2019-11-07 NOTE — Assessment & Plan Note (Addendum)
BP 145/80 today, and through chart review note multiple elevated pressures in the past.  Asymptomatic.  Provided printed prescription for blood pressure cuff to start taking BP at least 3 times weekly to keep journal for follow-up visit.  If not covered by insurance, could consider ambulatory BP monitoring through our pharmacy clinic.  Check BMP.

## 2019-11-07 NOTE — Progress Notes (Signed)
    SUBJECTIVE:   CHIEF COMPLAINT / HPI: F/u spasms   Meghan [name] is a 59 year old female presenting for follow-up of back muscle spasms.  She reports she is doing significantly better today and started to feel better on Thursday/Friday after visit.  Still having some residual soreness around her bilateral scapula.  Taking the Flexeril as needed and has been using the meloxicam.  Denies any numbness/tingling, weakness, clicking/popping of the shoulder joints.  No falls and has had no difficulty with range of motion now.  PERTINENT  PMH / PSH: GERD, multiple sites of osteoarthritis, iron/B12 deficiency, history of back pain  OBJECTIVE:   BP (!) 145/80   Pulse 78   Wt 187 lb 3.2 oz (84.9 kg)   SpO2 99%   BMI 29.76 kg/m   General: Alert, NAD, smiling  HEENT: NCAT, MMM Lungs: No increased WOB  Msk: Moves all extremities spontaneously  Ext: Warm, dry, 2+ radial pulses b/l Neck/Back: - Inspection: no gross deformity or asymmetry, swelling or ecchymosis - Palpation: Non-TTP cervical/thoracic spinous process, Some tenderness around rhomboids bilaterally (L>R) with tense trapezius bilaterally.   - ROM: full active ROM of the cervical spine with neck extension, rotation, flexion without pain, and bilateral shoulder joints  - Strength: 5/5 upper extremity strength bilaterally through wrist, elbow, shoulder joints, and cervical spine - Neuro: sensation intact in the C5-C8 nerve root distribution b/l, 2+ C5-C7 reflexes - Special testing: Negative spurling's bilaterally  - Normal gait   ASSESSMENT/PLAN:   Muscle spasms of neck Substantial improvement since visit on 9/22.  Mild residual soreness of bilateral rhomboid musculature, L>R, otherwise neurologically intact with appropriate strength throughout.  Recommended completing meloxicam course, Flexeril as needed, heat, and massage. ROM stretching as tolerated. If continued spasm presence on follow-up, consider obtaining a cervical/thoracic XRs.    Elevated blood pressure reading without diagnosis of hypertension BP 145/80 today, and through chart review note multiple elevated pressures in the past.  Asymptomatic.  Provided printed prescription for blood pressure cuff to start taking BP at least 3 times weekly to keep journal for follow-up visit.  If not covered by insurance, could consider ambulatory BP monitoring through our pharmacy clinic.  Check BMP.  Iron deficiency anemia Started iron supplementation in addition to B12 injections in 08/2019.  Will recheck CBC today.    Follow-up in 1 month for above with Dr. Andria Frames or sooner if needed.  Patriciaann Clan, [place]

## 2019-11-08 ENCOUNTER — Encounter: Payer: Self-pay | Admitting: Family Medicine

## 2019-11-08 ENCOUNTER — Ambulatory Visit: Payer: 59 | Admitting: Physical Therapy

## 2019-11-08 LAB — BASIC METABOLIC PANEL
BUN/Creatinine Ratio: 12 (ref 9–23)
BUN: 9 mg/dL (ref 6–24)
CO2: 26 mmol/L (ref 20–29)
Calcium: 9.6 mg/dL (ref 8.7–10.2)
Chloride: 106 mmol/L (ref 96–106)
Creatinine, Ser: 0.75 mg/dL (ref 0.57–1.00)
GFR calc Af Amer: 101 mL/min/{1.73_m2} (ref >59)
GFR calc non Af Amer: 88 mL/min/{1.73_m2} (ref >59)
Glucose: 107 mg/dL — ABNORMAL HIGH (ref 65–99)
Potassium: 3.8 mmol/L (ref 3.5–5.2)
Sodium: 143 mmol/L (ref 134–144)

## 2019-11-08 LAB — CBC WITH DIFFERENTIAL/PLATELET
Basophils Absolute: 0 10*3/uL (ref 0.0–0.2)
Basos: 1 %
EOS (ABSOLUTE): 0.2 10*3/uL (ref 0.0–0.4)
Eos: 5 %
Hematocrit: 35.6 % (ref 34.0–46.6)
Hemoglobin: 11.3 g/dL (ref 11.1–15.9)
Immature Grans (Abs): 0 10*3/uL (ref 0.0–0.1)
Immature Granulocytes: 0 %
Lymphocytes Absolute: 1.5 10*3/uL (ref 0.7–3.1)
Lymphs: 32 %
MCH: 27.6 pg (ref 26.6–33.0)
MCHC: 31.7 g/dL (ref 31.5–35.7)
MCV: 87 fL (ref 79–97)
Monocytes Absolute: 0.3 10*3/uL (ref 0.1–0.9)
Monocytes: 7 %
Neutrophils Absolute: 2.6 10*3/uL (ref 1.4–7.0)
Neutrophils: 55 %
Platelets: 311 10*3/uL (ref 150–450)
RBC: 4.09 x10E6/uL (ref 3.77–5.28)
RDW: 15.5 % — ABNORMAL HIGH (ref 11.7–15.4)
WBC: 4.7 10*3/uL (ref 3.4–10.8)

## 2019-11-09 ENCOUNTER — Ambulatory Visit: Payer: 59 | Admitting: Physical Therapy

## 2019-11-10 ENCOUNTER — Telehealth: Payer: Self-pay | Admitting: Physical Therapy

## 2019-11-10 NOTE — Telephone Encounter (Signed)
Patient contacted due to recent missed and canceled PT appointment. She stated she had an emergency come up but would present at her next appointments on 11/15/19 and 11/17/19. Patient reminded of attendance policy and she expressed understanding.  Hilda Blades, PT, DPT, LAT, ATC 11/10/19  4:59 PM Phone: [phone] Fax: [phone]

## 2019-11-11 ENCOUNTER — Other Ambulatory Visit: Payer: Self-pay

## 2019-11-11 ENCOUNTER — Ambulatory Visit: Payer: No Typology Code available for payment source

## 2019-11-11 ENCOUNTER — Ambulatory Visit (INDEPENDENT_AMBULATORY_CARE_PROVIDER_SITE_OTHER): Payer: 59

## 2019-11-11 DIAGNOSIS — E538 Deficiency of other specified B group vitamins: Secondary | ICD-10-CM

## 2019-11-11 NOTE — Progress Notes (Signed)
Pt is here for a b12 injection today.    Date of last office visit that b12 was discussed 08/22/2019  Last injection was 10/04/2019  Injection given in left deltoid, pt tolerated well and will stop at front desk to schedule next appt. Talbot Grumbling, RN

## 2019-11-15 ENCOUNTER — Ambulatory Visit: Payer: 59 | Attending: Family Medicine | Admitting: Physical Therapy

## 2019-11-15 ENCOUNTER — Other Ambulatory Visit: Payer: Self-pay

## 2019-11-15 ENCOUNTER — Encounter: Payer: Self-pay | Admitting: Physical Therapy

## 2019-11-15 DIAGNOSIS — M25561 Pain in right knee: Secondary | ICD-10-CM | POA: Insufficient documentation

## 2019-11-15 DIAGNOSIS — R600 Localized edema: Secondary | ICD-10-CM | POA: Insufficient documentation

## 2019-11-15 DIAGNOSIS — M6281 Muscle weakness (generalized): Secondary | ICD-10-CM | POA: Diagnosis present

## 2019-11-15 NOTE — Patient Instructions (Signed)
Access Code: [email or web address] URL: [email or web address] Date: 11/15/2019 Prepared by: Hilda Blades  Exercises Supine Quad Set - 3 x weekly - 10 reps - 5 seconds hold Supine Active Straight Leg Raise - 3 x weekly - 2 sets - 10 reps Short Arc Quad with Ankle Weight - 3 x weekly - 3 sets - 10 reps Seated Long Arc Quad with Ankle Weight - 3 x weekly - 3 sets - 10 reps Sidelying Over and Back - 3 x weekly - 3 sets - 10 reps Prone Hamstring Curl with Ankle Weight - 3 x weekly - 3 sets - 10 reps

## 2019-11-15 NOTE — Therapy (Signed)
[place], [place], [address] Phone: [phone]   Fax:  [phone]  Physical Therapy Treatment / Discharge  Patient Details  Name: Meghan [name] MRN: [id number] Date of Birth: 12-Mar-1960 Referring Provider (PT): McDiarmid, Blane Ohara, MD   Encounter Date: 11/15/2019   PT End of Session - 11/15/19 1621    Visit Number 9    Number of Visits --    Date for PT Re-Evaluation --    Authorization Type [employer]    PT Start Time 4619    PT Stop Time 1655    PT Time Calculation (min) 38 min    Activity Tolerance Patient tolerated treatment well    Behavior During Therapy [facility] for tasks assessed/performed           Past Medical History:  Diagnosis Date  . Angio-edema   . Asthma   . Back pain 01/13/2012  . Lichen planus   . Malignant hyperthermia    1988, 1987  . Unspecified sinusitis (chronic) 08/12/2012  . Urticaria     Past Surgical History:  Procedure Laterality Date  . ADENOIDECTOMY    . ESOPHAGOGASTRODUODENOSCOPY (EGD) WITH PROPOFOL N/A 02/25/2019   Procedure: ESOPHAGOGASTRODUODENOSCOPY (EGD) WITH PROPOFOL;  Surgeon: Carol Ada, MD;  Location: [facility];  Service: Endoscopy;  Laterality: N/A;  . HEMOSTASIS CLIP PLACEMENT  02/25/2019   Procedure: HEMOSTASIS CLIP PLACEMENT;  Surgeon: Carol Ada, MD;  Location: [facility];  Service: Endoscopy;;  . POLYPECTOMY  02/25/2019   Procedure: POLYPECTOMY;  Surgeon: Carol Ada, MD;  Location: [facility];  Service: Endoscopy;;  . TONSILLECTOMY  1987  . TUBAL LIGATION  1988  . UTERINE ARTERY EMBOLIZATION  2000   for fibroids     There were no vitals filed for this visit.   Subjective Assessment - 11/15/19 1620    Subjective Patient reports knee is feeling better and she feels the swelling has resolved. Notes just a little tightness across the top of the knee. She notes that she has been walking more around a track and doesn't feel like she  has any limitation with walking or stairs.    How long can you sit comfortably? No limitation    How long can you walk comfortably? No limitation    Patient Stated Goals Patient would like to get rid of the pain    Currently in Pain? No/denies              [facility] PT Assessment - 11/15/19 0001      Assessment   Medical Diagnosis Acute pain of right knee    Referring Provider (PT) McDiarmid, Blane Ohara, MD      Precautions   Precautions None      Restrictions   Weight Bearing Restrictions No      Balance Screen   Has the patient fallen in the past 6 months No    Has the patient had a decrease in activity level because of a fear of falling?  No    Is the patient reluctant to leave their home because of a fear of falling?  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, bending, running      Observation/Other Assessments   Observations Patient appears in no apparent distress    Focus on Therapeutic Outcomes (FOTO)  23% limitation  Circumferential Edema   Circumferential - Right 41    Circumferential - Left  40      AROM   Right Knee Extension 0    Right Knee Flexion 126    Left Knee Extension 0    Left Knee Flexion 128      Strength   Right Knee Flexion 5/5    Right Knee Extension 5/5    Left Knee Flexion 5/5    Left Knee Extension 5/5      Transfers   Transfers Independent with all Transfers      Ambulation/Gait   Gait Pattern Within Functional Limits                         [facility] Adult PT Treatment/Exercise - 11/15/19 0001      Exercises   Exercises Knee/Hip      Knee/Hip Exercises: Aerobic   Recumbent Bike L1 x 4 min      Knee/Hip Exercises: Seated   Long Arc Quad 2 sets;10 reps    Long Arc Quad Weight 5 lbs.      Knee/Hip Exercises: Supine   Quad Sets 5 reps   5 sec   Short Arc Quad Sets 2 sets;10 reps    Short Arc Quad  Sets Limitations 5#    Straight Leg Raises 2 sets;10 reps      Knee/Hip Exercises: Sidelying   Hip ABduction 2 sets;10 reps    Hip ABduction Limitations arcs      Knee/Hip Exercises: Prone   Hamstring Curl 2 sets;10 reps    Hamstring Curl Limitations 5#                  PT Education - 11/15/19 1621    Education Details HEP, walking progress    Person(s) Educated Patient    Methods Explanation;Handout    Comprehension Verbalized understanding;Need further instruction;Returned demonstration            PT Short Term Goals - 11/15/19 1629      PT SHORT TERM GOAL #1   Title Patient will be I with initial HEP to progress with PT    Time 4    Period Weeks    Status Achieved    Target Date 10/11/19      PT SHORT TERM GOAL #2   Title Patient will exhibit improvement in knee swelling to </= 41 cm to improve knee motion.    Time 4    Period Weeks    Status Achieved    Target Date 10/11/19      PT SHORT TERM GOAL #3   Title Patient will exhibit improve knee motion >/= 110 deg knee flexion to improve gait    Time 4    Period Weeks    Status Achieved    Target Date 10/11/19      PT SHORT TERM GOAL #4   Title Patient will report </= 4/10 pain level to reduce functional limitation    Time 4    Period Weeks    Status Achieved    Target Date 10/11/19             PT Long Term Goals - 11/15/19 1630      PT LONG TERM GOAL #1   Title Patient will be I with final HEP to maintain progress with PT    Time 8    Period Weeks    Status Achieved      PT  LONG TERM GOAL #2   Title Patient will report no limitation with walking or work related activities    Time 8    Period Weeks    Status Achieved      PT LONG TERM GOAL #3   Title Patient will exhibit normalized knee AROM to equal opposite side in order allow for improved movement quality    Time 8    Period Weeks    Status Achieved      PT LONG TERM GOAL #4   Title Patient will exhibit improved right knee  strength to grossly = 5/5 MMT to improve stair negotiation and squatting    Time 8    Period Weeks    Status Achieved      PT LONG TERM GOAL #5   Title Patient will report </= 2/10 pain level with activity to be able to perform ADLs without limitation    Time 8    Period Weeks    Status Achieved                 Plan - 11/15/19 1623    Clinical Impression Statement Patient has acheived all established goals and is independent with her HEP. She has made great progress with her motion and strength, and exhibits a reduction in knee swelling. She will be discharged from formal PT and she was educated on progressing activity level and walking. She was instructed on follow-up instructions if needed.    PT Next Visit Plan NA - discharge    PT Home Exercise Plan [place] and Agree with Plan of Care Patient              Visit Diagnosis: Acute pain of right knee  Muscle weakness (generalized)  Localized edema     Problem List Patient Active Problem List   Diagnosis Date Noted  . Elevated blood pressure reading without diagnosis of hypertension 11/07/2019  . Muscle spasms of neck 11/06/2019  . Fatigue 03/28/2019  . Abnormal Pap smear of cervix 11/10/2018  . Iron deficiency anemia 10/27/2018  . Lichen planus 53/00/5110  . Seborrheic keratoses 06/11/2017  . History of anaphylaxis 03/04/2017  . Vitamin B 12 deficiency 01/14/2016  . Headache(784.0) 10/01/2010  . 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  . Polyp of colon, adenomatous 01/29/2007  . GERD (gastroesophageal reflux disease) 01/29/2007  . Osteoarthritis, multiple sites 04/09/2006     PHYSICAL THERAPY DISCHARGE SUMMARY  Visits from Start of Care: 9  Current functional level related to goals / functional outcomes: See above   Remaining deficits: See above   Education / Equipment: HEP Plan: Patient agrees to discharge.  Patient goals were met. Patient is being discharged due to meeting the stated  rehab goals.  ?????      Hilda Blades, PT, DPT, LAT, ATC 11/15/19  5:01 PM Phone: [phone] Fax: [place] [facility] [address] [place], [place], [address] Phone: [phone]   Fax:  [phone]  Name: Meghan [name] MRN: [id number] Date of Birth: [birth year]

## 2019-11-17 ENCOUNTER — Ambulatory Visit: Payer: 59 | Admitting: Physical Therapy

## 2019-11-23 ENCOUNTER — Telehealth: Payer: Self-pay

## 2019-11-23 NOTE — Telephone Encounter (Signed)
Patient called and left message requesting a return call regarding scheduling a mammogram.  Returned call and spoke with Patient, prefers to do mammography scholarship (for December 28, 8946) , application mailed. Addressed verified.

## 2019-11-23 NOTE — Telephone Encounter (Signed)
Error

## 2019-12-06 ENCOUNTER — Other Ambulatory Visit: Payer: Self-pay | Admitting: Family Medicine

## 2019-12-06 DIAGNOSIS — Z1231 Encounter for screening mammogram for malignant neoplasm of breast: Secondary | ICD-10-CM

## 2019-12-12 ENCOUNTER — Other Ambulatory Visit: Payer: Self-pay

## 2019-12-12 ENCOUNTER — Ambulatory Visit: Payer: No Typology Code available for payment source

## 2019-12-12 ENCOUNTER — Ambulatory Visit (INDEPENDENT_AMBULATORY_CARE_PROVIDER_SITE_OTHER): Payer: 59

## 2019-12-12 DIAGNOSIS — E538 Deficiency of other specified B group vitamins: Secondary | ICD-10-CM

## 2019-12-13 ENCOUNTER — Ambulatory Visit
Admission: RE | Admit: 2019-12-13 | Discharge: 2019-12-13 | Disposition: A | Discharge status: Home / Self Care | Payer: No Typology Code available for payment source | Source: Ambulatory Visit | Attending: Family Medicine | Admitting: Family Medicine

## 2019-12-13 DIAGNOSIS — Z1231 Encounter for screening mammogram for malignant neoplasm of breast: Secondary | ICD-10-CM

## 2019-12-14 NOTE — Progress Notes (Signed)
Pt is here for a b12 injection today.    Date of last office visit that b12 was discussed 11/07/2019  Last injection was 11/11/2019  Injection given in RD deltoid, pt tolerated well and will scheduled for next B12 injection on 12/1, appointment card given.    Talbot Grumbling, RN

## 2020-01-11 ENCOUNTER — Other Ambulatory Visit: Payer: Self-pay

## 2020-01-11 ENCOUNTER — Ambulatory Visit (INDEPENDENT_AMBULATORY_CARE_PROVIDER_SITE_OTHER): Payer: 59

## 2020-01-11 ENCOUNTER — Ambulatory Visit: Payer: No Typology Code available for payment source

## 2020-01-11 DIAGNOSIS — E538 Deficiency of other specified B group vitamins: Secondary | ICD-10-CM | POA: Diagnosis not present

## 2020-01-11 NOTE — Progress Notes (Signed)
Pt is here for a b12 injection today.    Date of last office visit that b12 was discussed 11/07/2019  Last injection was 12/12/2019  Injection given in left deltoid, pt tolerated well and will stop at front desk to schedule next appt.   Talbot Grumbling, RN

## 2020-01-23 NOTE — Telephone Encounter (Signed)
This encounter was created in error - please disregard.

## 2020-01-25 ENCOUNTER — Ambulatory Visit: Payer: No Typology Code available for payment source

## 2020-01-25 NOTE — Chronic Care Management (AMB) (Signed)
   RNCM Care Management Collaboration 01/25/2020 Name: Meghan [name] MRN: [id number] DOB: October 22, 1960   Meghan [name] is a 59 y.o. year old female who sees Hensel, Jamal Collin, MD for primary care. RNCM was consulted by Patton Salles to assist the patient with  Care Coordination for an appointment to have a pap smear.     Intervention: RNCM reached out to the patient and confirmed what days and times were best for her. The patient was scheduled an appointment on 02/02/20 2:30 pm with Dr. Jeani Hawking.  Review of patient status, including review of consultants reports, relevant laboratory and other test results, and collaboration with appropriate care team members and the patient's provider was performed as part of comprehensive patient evaluation and provision of chronic care management services.      Plan:  1. No further follow up required by RN care manager at this time    [place], BSN, [place] Phone: [phone] I Fax: [phone]

## 2020-02-02 ENCOUNTER — Other Ambulatory Visit: Payer: Self-pay

## 2020-02-02 ENCOUNTER — Encounter: Payer: Self-pay | Admitting: Family Medicine

## 2020-02-02 ENCOUNTER — Ambulatory Visit (INDEPENDENT_AMBULATORY_CARE_PROVIDER_SITE_OTHER): Payer: 59 | Admitting: Family Medicine

## 2020-02-02 ENCOUNTER — Other Ambulatory Visit (HOSPITAL_COMMUNITY)
Admission: RE | Admit: 2020-02-02 | Discharge: 2020-02-02 | Disposition: A | Discharge status: Home / Self Care | Payer: 59 | Source: Ambulatory Visit | Attending: Family Medicine | Admitting: Family Medicine

## 2020-02-02 VITALS — BP 168/92 | HR 88 | Ht 67.0 in | Wt 191.6 lb

## 2020-02-02 DIAGNOSIS — R87618 Other abnormal cytological findings on specimens from cervix uteri: Secondary | ICD-10-CM | POA: Diagnosis not present

## 2020-02-02 DIAGNOSIS — R030 Elevated blood-pressure reading, without diagnosis of hypertension: Secondary | ICD-10-CM | POA: Diagnosis not present

## 2020-02-02 NOTE — Progress Notes (Signed)
    SUBJECTIVE:   CHIEF COMPLAINT / HPI:   Patient presents today for repeat Pap smear.  Pap smear 10/2018 had normal cytology but high risk HPV; recommend repeat in 1 year.  Patient has no complaints.  Patient not experiencing any vaginal irritation, rashes, lesions, discomfort or bleeding/spotting.  Also denies any urinary symptoms such as hematuria or dysuria.  PERTINENT  PMH / PSH: History of abnormal Pap smear, adenomatous polyp of colon, GERD, seborrheic keratoses, lichen planus, iron deficiency anemia, B12 deficiency, elevated blood pressure reading without diagnosis of hypertension  OBJECTIVE:   BP (!) 168/92   Pulse 88   Ht 5\' 7"  (1.702 m)   Wt 191 lb 9.6 oz (86.9 kg)   SpO2 99%   BMI 30.01 kg/m    General: well-developed, awake, alert, oriented, no acute distress Respiratory: Normal work of breathing, no respiratory distress Extremities: Moving all extremities spontaneously Neuro: Cranial nerves II through X grossly intact Vulva: normal appearing vulva without rashes, lesions, or deformities Vagina: normal vaginal tissue, pale pink, physiologic white-ish yellow-ish discharge, small red linear lesion on vaginal wall (patient's left) without active bleeding Cervix: normal appearance without lesions  ASSESSMENT/PLAN:   Abnormal Pap smear of cervix Repeated PAP smear today with HPV testing.   Elevated blood pressure reading without diagnosis of hypertension BP today 168/92. Recommended follow up with PCP for potential diagnosis of HTN and start of anti-hypertensives.      Ezequiel Essex, MD [place]

## 2020-02-02 NOTE — Assessment & Plan Note (Signed)
BP today 168/92. Recommended follow up with PCP for potential diagnosis of HTN and start of anti-hypertensives.

## 2020-02-02 NOTE — Assessment & Plan Note (Signed)
Repeated PAP smear today with HPV testing.

## 2020-02-02 NOTE — Patient Instructions (Addendum)
It was wonderful to meet you today. Thank you for allowing me to be a part of your care. Below is a short summary of what we discussed at your visit today:  Your repeat PAP smear Thank you for coming in today for your repeat PAP smear. As soon as we have the results, we will send them to you. If everything is normal, we will send you a letter and/or a MyChart message. If anything comes back abnormal, we will give you a call with the results and a plan going forward.   Blood pressure Your blood pressure today was elevated at 168/92. At your last check three months ago, it was also elevated. Please make an appointment to follow up with your PCP about a possible diagnosis of high blood pressure (hypertension) and treatment options.   If you have any questions or concerns, please do not hesitate to contact us via phone or MyChart message.   Ezequiel Essex, MD

## 2020-02-07 LAB — CYTOLOGY - PAP
Comment: NEGATIVE
Diagnosis: UNDETERMINED — AB
High risk HPV: POSITIVE — AB

## 2020-02-08 ENCOUNTER — Telehealth: Payer: Self-pay | Admitting: *Deleted

## 2020-02-08 NOTE — Progress Notes (Signed)
Called patient regarding abnormal results, explained that these results do not tell us anything definitively. Only that we need to do further testing, discussed with patient that the next appropriate step would be a colposcopy and explained what a colposcopy entails. Patient voices clear understanding and agrees with plan, she does not have any concerns or further questions at this time. Contacted team regarding scheduling patient within the next month in colposcopy clinic, patient prefers 12/30 or MLK holiday given her work schedule, explained to her that due to the holiday schedule 12/30 would be less likely. Will try to schedule for around 02/27/2020.

## 2020-02-08 NOTE — Telephone Encounter (Signed)
Contacted pt and colpo appt scheduled for 02/16/2020. Meghan [name], CMA

## 2020-02-08 NOTE — Telephone Encounter (Signed)
-----   Message from Reece Leader, DO sent at 02/08/2020  9:15 AM EST ----- Called patient regarding abnormal results, patient understands and agrees that next step is colposcopy. Would please appreciate assistance in scheduling this patient in colposcopy clinic within the next month. Due to her work schedule, prefers around Jan 17th for the [facility] holiday when she is off. Thank you

## 2020-02-09 ENCOUNTER — Ambulatory Visit (INDEPENDENT_AMBULATORY_CARE_PROVIDER_SITE_OTHER): Payer: 59

## 2020-02-09 ENCOUNTER — Other Ambulatory Visit: Payer: Self-pay

## 2020-02-09 DIAGNOSIS — E538 Deficiency of other specified B group vitamins: Secondary | ICD-10-CM

## 2020-02-15 NOTE — Progress Notes (Signed)
Pt is here for a b12 injection today.    Date of last office visit that b12 was discussed 11/07/2019  Last injection was 12//02/2019  Injection given in right deltoid, pt tolerated well and scheduled for next monthly injection on 03/13/20    Veronda Prude, RN

## 2020-02-16 ENCOUNTER — Other Ambulatory Visit: Payer: Self-pay

## 2020-02-16 ENCOUNTER — Ambulatory Visit (INDEPENDENT_AMBULATORY_CARE_PROVIDER_SITE_OTHER): Payer: 59 | Admitting: Family Medicine

## 2020-02-16 ENCOUNTER — Other Ambulatory Visit: Payer: Self-pay | Admitting: Family Medicine

## 2020-02-16 VITALS — BP 134/76 | HR 81 | Ht 66.5 in | Wt 190.8 lb

## 2020-02-16 DIAGNOSIS — B373 Candidiasis of vulva and vagina: Secondary | ICD-10-CM

## 2020-02-16 DIAGNOSIS — R87610 Atypical squamous cells of undetermined significance on cytologic smear of cervix (ASC-US): Secondary | ICD-10-CM | POA: Diagnosis not present

## 2020-02-16 DIAGNOSIS — R87810 Cervical high risk human papillomavirus (HPV) DNA test positive: Secondary | ICD-10-CM | POA: Diagnosis not present

## 2020-02-16 DIAGNOSIS — N898 Other specified noninflammatory disorders of vagina: Secondary | ICD-10-CM

## 2020-02-16 DIAGNOSIS — B3731 Acute candidiasis of vulva and vagina: Secondary | ICD-10-CM

## 2020-02-16 LAB — POCT WET PREP (WET MOUNT)
Clue Cells Wet Prep Whiff POC: NEGATIVE
Trichomonas Wet Prep HPF POC: ABSENT

## 2020-02-16 MED ORDER — FLUCONAZOLE 150 MG PO TABS: 150.0000 mg | ORAL_TABLET | Freq: Once | ORAL | 0 refills | Status: AC

## 2020-02-16 NOTE — Patient Instructions (Signed)
Colposcopy, Care After This sheet gives you information about how to care for yourself after your procedure. Your doctor may also give you more specific instructions. If you have problems or questions, contact your doctor. What can I expect after the procedure? If you did not have a tissue sample removed (did not have a biopsy), you may only have some spotting for a few days. You can go back to your normal activities. If you had a tissue sample removed, it is common to have:  Soreness and pain. This may last for a few days.  Light-headedness.  Mild bleeding from your vagina or dark-colored, grainy discharge from your vagina. This may last for a few days. You may need to wear a sanitary pad.  Spotting for at least 48 hours after the procedure. Follow these instructions at home:   Take over-the-counter and prescription medicines only as told by your doctor. Ask your doctor what medicines you can start taking again. This is very important if you take blood-thinning medicine.  Do not drive or use heavy machinery while taking prescription pain medicine.  For 3 days, or as long as your doctor tells you, avoid: ? Douching. ? Using tampons. ? Having sex.  If you use birth control (contraception), keep using it.  Limit activity for the first day after the procedure. Ask your doctor what activities are safe for you.  It is up to you to get the results of your procedure. Ask your doctor when your results will be ready.  Keep all follow-up visits as told by your doctor. This is important. Contact a doctor if:  You get a skin rash. Get help right away if:  You are bleeding a lot from your vagina. It is a lot of bleeding if you are using more than one pad an hour for 2 hours in a row.  You have clumps of blood (blood clots) coming from your vagina.  You have a fever.  You have chills  You have pain in your lower belly (pelvic area).  You have signs of infection, such as vaginal  discharge that is: ? Different than usual. ? Yellow. ? Bad-smelling.  You have very pain or cramps in your lower belly that do not get better with medicine.  You feel light-headed.  You feel dizzy.  You pass out (faint). Summary  If you did not have a tissue sample removed (did not have a biopsy), you may only have some spotting for a few days. You can go back to your normal activities.  If you had a tissue sample removed, it is common to have mild pain and spotting for 48 hours.  For 3 days, or as long as your doctor tells you, avoid douching, using tampons and having sex.  Get help right away if you have bleeding, very bad pain, or signs of infection. This information is not intended to replace advice given to you by your health care provider. Make sure you discuss any questions you have with your health care provider. Document Revised: 01/09/2017 Document Reviewed: 10/17/2015 Elsevier Patient Education  2020 [employer].  

## 2020-02-16 NOTE — Progress Notes (Signed)
Patient ID: Meghan [name], female   DOB: [birth year], 60 y.o.   MRN: [id number]  Chief Complaint  Patient presents with  . Abnormal Pap Smear    HPI Meghan [name] is a 60 y.o. female.  ASCUS with + HPV HPI  Indications: Pap smear on December 2021 showed: ASCUS with POSITIVE high risk HPV. Previous colposcopy: NA. Prior cervical treatment: NA.  Past Medical History:  Diagnosis Date  . Angio-edema   . Asthma   . Back pain 01/13/2012  . Lichen planus   . Malignant hyperthermia    1988, 1987  . Unspecified sinusitis (chronic) 08/12/2012  . Urticaria     Past Surgical History:  Procedure Laterality Date  . ADENOIDECTOMY    . ESOPHAGOGASTRODUODENOSCOPY (EGD) WITH PROPOFOL N/A 02/25/2019   Procedure: ESOPHAGOGASTRODUODENOSCOPY (EGD) WITH PROPOFOL;  Surgeon: Carol Ada, MD;  Location: [facility];  Service: Endoscopy;  Laterality: N/A;  . HEMOSTASIS CLIP PLACEMENT  02/25/2019   Procedure: HEMOSTASIS CLIP PLACEMENT;  Surgeon: Carol Ada, MD;  Location: [facility];  Service: Endoscopy;;  . POLYPECTOMY  02/25/2019   Procedure: POLYPECTOMY;  Surgeon: Carol Ada, MD;  Location: [facility];  Service: Endoscopy;;  . TONSILLECTOMY  1987  . TUBAL LIGATION  1988  . UTERINE ARTERY EMBOLIZATION  2000   for fibroids     Family History  Problem Relation Age of Onset  . Hypertension Mother   . Kidney disease Mother   . Cholecystitis Mother   . Hypertension Father   . Diabetes Father   . Kidney disease Daughter     Social History Social History   Tobacco Use  . Smoking status: Former Smoker    Quit date: 09/13/1995    Years since quitting: 24.4  . Smokeless tobacco: Never Used  Vaping Use  . Vaping Use: Never used  Substance Use Topics  . Alcohol use: Yes    Alcohol/week: 0.5 standard drinks    Types: 1 drink(s) per week  . Drug use: No    Allergies  Allergen Reactions  . Shellfish Allergy Itching and Swelling  . Codeine Hives and Itching  . Latex Swelling     Per allergist  . Lidocaine Swelling    Disorientation, shocks system  OK to use mepivacaine   . Metronidazole Hives and Itching  . Morphine     Shuts system down  . Other     malignant hyperthermia  . Oxycodone-Acetaminophen     Shuts system down  . Sulfonamide Derivatives Hives and Itching    Current Outpatient Medications  Medication Sig Dispense Refill  . Blood Pressure Monitoring (BLOOD PRESSURE CUFF) MISC Measure BP at home 3 times weekly. 1 each 0  . Cyanocobalamin (B-12 COMPLIANCE INJECTION) 1000 MCG/ML KIT Inject 1,000 mcg as directed every 30 (thirty) days.    . cyclobenzaprine (FLEXERIL) 10 MG tablet Take 1 tablet (10 mg total) by mouth 3 (three) times daily as needed for muscle spasms. 30 tablet 0  . EPINEPHrine 0.3 mg/0.3 mL IJ SOAJ injection Inject 0.3 mLs (0.3 mg total) into the muscle as needed for anaphylaxis. 2 each 1  . ferrous sulfate 325 (65 FE) MG tablet Take 325 mg by mouth 2 (two) times daily.    . meloxicam (MOBIC) 15 MG tablet Take 1 tablet (15 mg total) by mouth daily. 15 tablet 0  . Multiple Vitamin (MULTIVITAMIN WITH MINERALS) TABS tablet Take 1 tablet by mouth daily.    Marland Kitchen triamcinolone cream (KENALOG) 0.5 % Apply 1 application topically 3 (  three) times daily. 454 g 1   Current Facility-Administered Medications  Medication Dose Route Frequency Provider Last Rate Last Admin  . cyanocobalamin ((VITAMIN B-12)) injection 1,000 mcg  1,000 mcg Intramuscular Q30 days [name], MD   1,000 mcg at 02/09/20 1500    Review of Systems Review of Systems  Genitourinary:       Abnormal PAP, vaginal discharge  All other systems reviewed and are negative.   Blood pressure 134/76, pulse 81, height 5' 6.5" (1.689 m), weight 190 lb 12.8 oz (86.5 kg), SpO2 96 %.  Physical Exam Physical Exam Vitals and nursing note reviewed. Exam conducted with a chaperone present Jarrett Soho Pipkins).  Genitourinary:    Vagina: Vaginal discharge present.     Comments:  Satisfactory cervical exam, no abnormal vascularization. Neg aceto white lesion. Normal lugols uptake.  + creamy vaginal discharge     Data Reviewed 02/16/20  Assessment    Procedure Details  The risks and benefits of the procedure and Written informed consent obtained.  Speculum placed in vagina and excellent visualization of cervix achieved, cervix swabbed x 3 with acetic acid solution.  Specimens: ECC. Biopsy not needed  Complications: none.     Plan  Vaginitis: Wet prep completed. I will call her with the result.  NB: I called and discussed wet prep result with her: + Candida infection. Treat with Diflucan which I escribed to her pharmacy of choice. No further questions.  ECC collected Specimens labelled and sent to Pathology. Return to discuss Pathology results in 2 weeks.      Andrena Mews 02/16/2020

## 2020-02-22 ENCOUNTER — Telehealth: Payer: Self-pay | Admitting: Family Medicine

## 2020-02-22 NOTE — Telephone Encounter (Signed)
I called pathology lab and the pathologist stated that some of the lab request was sent out because they are back logged. I informed the patient and I will contact her as soon as I have a result. She agreed with the plan and was appreciative of the update.

## 2020-02-24 ENCOUNTER — Telehealth: Payer: Self-pay | Admitting: Family Medicine

## 2020-02-24 NOTE — Telephone Encounter (Signed)
Result discussed with her. Repeat PAP in 1 yr

## 2020-03-13 ENCOUNTER — Other Ambulatory Visit: Payer: Self-pay

## 2020-03-13 ENCOUNTER — Ambulatory Visit (INDEPENDENT_AMBULATORY_CARE_PROVIDER_SITE_OTHER): Payer: 59

## 2020-03-13 DIAGNOSIS — E538 Deficiency of other specified B group vitamins: Secondary | ICD-10-CM

## 2020-03-13 MED ORDER — CYANOCOBALAMIN 1000 MCG/ML IJ SOLN
1000.0000 ug | Freq: Once | INTRAMUSCULAR | Status: AC
  Administered 2020-03-13: 1000 ug via INTRAMUSCULAR

## 2020-03-13 NOTE — Progress Notes (Signed)
Pt is here for a b12 injection today.    Date of last office visit that b12 was discussed 11/07/2019.  Last injection was 02/09/2020.  Injection administered LD without complication.   Next monthly injection scheduled for 04/10/2020.

## 2020-04-10 ENCOUNTER — Ambulatory Visit (INDEPENDENT_AMBULATORY_CARE_PROVIDER_SITE_OTHER): Payer: 59

## 2020-04-10 ENCOUNTER — Other Ambulatory Visit: Payer: Self-pay

## 2020-04-10 DIAGNOSIS — E538 Deficiency of other specified B group vitamins: Secondary | ICD-10-CM

## 2020-04-10 NOTE — Progress Notes (Signed)
Pt is here for a b12 injection today.    Date of last office visit that b12 was discussed 11/07/2019  Last injection was 03/13/2020  Injection given in RD deltoid, pt tolerated well. Scheduled patient with Dr. Andria Frames on 3/30 as patient reports increased fatigue and feeling that B12 injections are not having the same effect.    Talbot Grumbling, RN

## 2020-05-08 ENCOUNTER — Other Ambulatory Visit: Payer: Self-pay

## 2020-05-08 ENCOUNTER — Ambulatory Visit (INDEPENDENT_AMBULATORY_CARE_PROVIDER_SITE_OTHER): Payer: 59

## 2020-05-08 DIAGNOSIS — E538 Deficiency of other specified B group vitamins: Secondary | ICD-10-CM | POA: Diagnosis not present

## 2020-05-08 NOTE — Progress Notes (Signed)
Pt is here for a b12 injection today.    Date of last office visit that b12 was discussed 11/07/2019. Patient had reported at last visit that she was having increased fatigue. Patient reports that since changing the timing of her iron and magnesium, this has improved.   Last injection was 04/10/2020. Spoke with Dr. Andria Frames who approved patient receiving injection 2 days early.   Injection given in LD deltoid, pt tolerated well and scheduled for next injection on 5/2.   Talbot Grumbling, RN

## 2020-05-09 ENCOUNTER — Ambulatory Visit: Payer: No Typology Code available for payment source | Admitting: Family Medicine

## 2020-06-06 ENCOUNTER — Telehealth: Payer: Self-pay

## 2020-06-06 DIAGNOSIS — Z111 Encounter for screening for respiratory tuberculosis: Secondary | ICD-10-CM

## 2020-06-06 NOTE — Telephone Encounter (Signed)
Patient calls nurse line requesting a QuantiFERON-TB Gold test for her employer. Patient has an apt on 5/2 for B12 injection and would like this test done then. Please place order if appropriate.

## 2020-06-08 NOTE — Telephone Encounter (Signed)
Pt informed. Meghan [name] Meghan [name], CMA  

## 2020-06-08 NOTE — Telephone Encounter (Signed)
Please let patient know an order for the Quantiferon Gold test has been placed.  She may come by the lab starting today to get the blood test drawn.

## 2020-06-11 ENCOUNTER — Other Ambulatory Visit: Payer: No Typology Code available for payment source

## 2020-06-11 ENCOUNTER — Other Ambulatory Visit: Payer: Self-pay

## 2020-06-11 ENCOUNTER — Ambulatory Visit (INDEPENDENT_AMBULATORY_CARE_PROVIDER_SITE_OTHER): Payer: 59

## 2020-06-11 DIAGNOSIS — R5383 Other fatigue: Secondary | ICD-10-CM

## 2020-06-11 DIAGNOSIS — E538 Deficiency of other specified B group vitamins: Secondary | ICD-10-CM

## 2020-06-11 DIAGNOSIS — Z111 Encounter for screening for respiratory tuberculosis: Secondary | ICD-10-CM

## 2020-06-11 MED ORDER — CYANOCOBALAMIN 1000 MCG/ML IJ SOLN
1000.0000 ug | Freq: Once | INTRAMUSCULAR | Status: AC
  Administered 2020-06-11: 1000 ug via INTRAMUSCULAR

## 2020-06-11 NOTE — Telephone Encounter (Signed)
Noted and agree. 

## 2020-06-11 NOTE — Progress Notes (Signed)
Pt is here for a b12 injection today.    Date of last office visit that b12 was discussed 11/07/2019.   Last injection was 05/08/2020.   Injection given in RD deltoid, pt tolerated well and scheduled for next injection on 6/6.  Patient walked to the lab for Quantiferon Gold test.

## 2020-06-13 LAB — QUANTIFERON-TB GOLD PLUS
QuantiFERON Mitogen Value: 5.64 IU/mL
QuantiFERON Nil Value: 0.01 IU/mL
QuantiFERON TB1 Ag Value: 0 IU/mL
QuantiFERON TB2 Ag Value: 0 IU/mL
QuantiFERON-TB Gold Plus: NEGATIVE

## 2020-06-14 ENCOUNTER — Encounter: Payer: Self-pay | Admitting: Family Medicine

## 2020-07-16 ENCOUNTER — Other Ambulatory Visit: Payer: Self-pay

## 2020-07-16 ENCOUNTER — Ambulatory Visit (INDEPENDENT_AMBULATORY_CARE_PROVIDER_SITE_OTHER): Payer: 59

## 2020-07-16 DIAGNOSIS — E538 Deficiency of other specified B group vitamins: Secondary | ICD-10-CM | POA: Diagnosis not present

## 2020-07-16 MED ORDER — CYANOCOBALAMIN 1000 MCG/ML IJ SOLN
1000.0000 ug | Freq: Once | INTRAMUSCULAR | Status: AC
  Administered 2020-07-16: 1000 ug via INTRAMUSCULAR

## 2020-07-16 NOTE — Progress Notes (Signed)
Pt is here for a b12 injection today.    Date of last office visit that b12 was discussed 11/07/2019  Last injection was 06/11/2020  Injection given in LD deltoid, pt tolerated well and scheduled for next injection on 08/20/2020.  Talbot Grumbling, RN

## 2020-08-10 ENCOUNTER — Other Ambulatory Visit: Payer: Self-pay

## 2020-08-10 ENCOUNTER — Ambulatory Visit (INDEPENDENT_AMBULATORY_CARE_PROVIDER_SITE_OTHER): Payer: 59

## 2020-08-10 DIAGNOSIS — E538 Deficiency of other specified B group vitamins: Secondary | ICD-10-CM | POA: Diagnosis not present

## 2020-08-10 MED ORDER — CYANOCOBALAMIN 1000 MCG/ML IJ SOLN
1000.0000 ug | Freq: Once | INTRAMUSCULAR | Status: AC
  Administered 2020-08-10: 1000 ug via INTRAMUSCULAR

## 2020-08-10 NOTE — Progress Notes (Signed)
Pt is here for a b12 injection today.     Date of last office visit that b12 was discussed 11/07/2019.   Last injection was 07/16/2020.   Injection given in RD deltoid, pt tolerated well and scheduled for next injection on 09/10/2020.

## 2020-08-20 ENCOUNTER — Ambulatory Visit: Payer: No Typology Code available for payment source

## 2020-09-10 ENCOUNTER — Other Ambulatory Visit: Payer: Self-pay

## 2020-09-10 ENCOUNTER — Ambulatory Visit (INDEPENDENT_AMBULATORY_CARE_PROVIDER_SITE_OTHER): Payer: 59

## 2020-09-10 DIAGNOSIS — E538 Deficiency of other specified B group vitamins: Secondary | ICD-10-CM | POA: Diagnosis not present

## 2020-09-10 MED ORDER — CYANOCOBALAMIN 1000 MCG/ML IJ SOLN
1000.0000 ug | Freq: Once | INTRAMUSCULAR | Status: AC
  Administered 2020-09-10: 1000 ug via INTRAMUSCULAR

## 2020-09-10 NOTE — Progress Notes (Signed)
Pt is here for a b12 injection today.    Date of last office visit that b12 was discussed 11/07/2019  Last injection was 08/10/2020  Injection given in left deltoid, pt tolerated well and scheduled for next injection on 10/11/20.   Talbot Grumbling, RN

## 2020-09-14 ENCOUNTER — Telehealth: Payer: Self-pay | Admitting: Allergy

## 2020-09-14 NOTE — Telephone Encounter (Signed)
Please call patient.  She has not been seen since 2019 and this is a new complaint. She may or may not need patch testing will need to talk to her first.   I will have to review the sheet that she dropped off. She may have to bring in samples of the items that were used in her mouth to do the testing with.

## 2020-09-14 NOTE — Telephone Encounter (Signed)
Patient came into office to schedule an appointment due to a reaction after getting invisalign. Patient had tingling in her mouth, lip swelling and a knot in her mouth. Patient was seen by Dr. [name] at [facility]. Patient is scheduled for an appointment on Monday, 09/17/2020. Patient is needing a metal test and maybe testing for the material they used. Patient dropped off safety data sheets for the doctor to review. If needed, I can contact the dentist on Monday to get a sample of the material used.

## 2020-09-17 ENCOUNTER — Ambulatory Visit (INDEPENDENT_AMBULATORY_CARE_PROVIDER_SITE_OTHER): Payer: 59 | Admitting: Allergy

## 2020-09-17 ENCOUNTER — Telehealth: Payer: Self-pay | Admitting: Allergy

## 2020-09-17 ENCOUNTER — Encounter: Payer: Self-pay | Admitting: Allergy

## 2020-09-17 ENCOUNTER — Other Ambulatory Visit: Payer: Self-pay

## 2020-09-17 VITALS — BP 150/90 | HR 56 | Temp 97.6°F | Resp 16 | Ht 66.5 in | Wt 186.4 lb

## 2020-09-17 DIAGNOSIS — T50995D Adverse effect of other drugs, medicaments and biological substances, subsequent encounter: Secondary | ICD-10-CM | POA: Insufficient documentation

## 2020-09-17 DIAGNOSIS — T783XXD Angioneurotic edema, subsequent encounter: Secondary | ICD-10-CM

## 2020-09-17 DIAGNOSIS — T783XXA Angioneurotic edema, initial encounter: Secondary | ICD-10-CM | POA: Insufficient documentation

## 2020-09-17 DIAGNOSIS — J4520 Mild intermittent asthma, uncomplicated: Secondary | ICD-10-CM | POA: Diagnosis not present

## 2020-09-17 DIAGNOSIS — R030 Elevated blood-pressure reading, without diagnosis of hypertension: Secondary | ICD-10-CM

## 2020-09-17 NOTE — Assessment & Plan Note (Addendum)
Lip angioedema with lip/tongue tingling after procedure at orthodontist putting invisalign. Denies any itching, rash or trouble breathing. Symptoms resolved after 1 day with taking Benadryl. History of multiple adverse reactions to medications and swelling with latex.  Clinical history and timeline concerning if patient developed these symptoms due to pressure/trauma to the area and not due to an "allergic" reaction.   Reviewed documents that patient brought in about items used during the procedure.  Discussed with patient that we do not have those items available for patch testing.  Will contact orthodontist regarding what items to patch test for and if they can provide sample.  We do have nickel in the metal patch test panel that we can test for.   Return for patch testing - metal panel and if orthodontist can provide additional items then will patch test for those items as well.

## 2020-09-17 NOTE — Patient Instructions (Addendum)
We will contact your orthodontist regarding what items to patch test for.  We only have nickel at our office.  If you don't hear back from [place] by next Monday - please call the office to follow up.  Patches are best placed on Monday with return to office on Wednesday and Friday of same week for readings.  Patches once placed should not get wet.  You do not have to stop any medications for patch testing but should not be on oral prednisone.   Astma: May use albuterol rescue inhaler 2 puffs every 4 to 6 hours as needed for shortness of breath, chest tightness, coughing, and wheezing. Monitor frequency of use.   Follow up for patch testing - wait until you hear back from our office to schedule.

## 2020-09-17 NOTE — Assessment & Plan Note (Signed)
   Follow up with PCP regarding elevated blood pressure.

## 2020-09-17 NOTE — Progress Notes (Signed)
Follow Up Note  RE: Meghan [name] MRN: [id number] DOB: 08-Mar-1960 Date of Office Visit: 09/17/2020  Referring provider: [name], MD Primary care provider: [name], MD  Chief Complaint: Asthma (No issues.) and Allergic Reaction Meghan [name] to orthodontist last Tuesday and something caused an allergic reaction. Says they used a plastic piece to help hold her mouth open, also used some solution or pate for her teeth. She has the invisalign. Says the reactions was lip swelling and bump or knot on her lips, face swelling. Tingling on tongue and lips)  History of Present Illness: I had the pleasure of seeing Meghan [name] for a follow up visit at the [facility] [place] [facility] [place] on 09/17/2020. She is a 60 y.o. female, who is being followed for adverse drug reaction and asthma. Her previous allergy office visit was on 02/08/2018 with Dr. Maudie Mercury. Today is a new complaint visit of reaction to invisalign .  Reaction Patient was at an orthodontist and was getting prepped to put in invisalign on 09/11/2020. She noticed some tingling of the lips/tongue during the procedure. She then noticed some headaches afterwards when she went home.  She had some swelling of the lips at night and the following morning the swelling was worse but denies any other symptoms such as itching, rash or trouble breathing.   She called her orthodontist and the symptoms resolved after taking Benadryl within 1 day and taking the invisalign out. She still has some of the brackets/composite on her teeth.   This was the first time she had this type of orthodontic work done. Question if she had reaction to invisalign and if so then want to get tested for nickel before starting the process for the traditional braces using the metal brackets which contain nickel.   Patient does not wear jewelry and not sure if she had any reaction to any metals in the past.  Some irritation from belts or buttons on pants.  Prior joint  replacements or metals in the body: no.  Latex caused swelling in the past - apparently patient was told that no latex items were used during this procedure.   No prior reactions to dental work.   Denies any other changes in meds, personal care products or diet during this time.   Asthma Denies any SOB, coughing, wheezing, chest tightness, nocturnal awakenings, ER/urgent care visits or prednisone use since the last visit.  Orthodontist contact information: Dr. [name] at [facility] [phone]   Assessment and Plan: Oreta is a 60 y.o. female with: Angioedema of lips Lip angioedema with lip/tongue tingling after procedure at orthodontist putting invisalign. Denies any itching, rash or trouble breathing. Symptoms resolved after 1 day with taking Benadryl. History of multiple adverse reactions to medications and swelling with latex. Clinical history and timeline concerning if patient developed these symptoms due to pressure/trauma to the area and not due to an "allergic" reaction.  Reviewed documents that patient brought in about items used during the procedure. Discussed with patient that we do not have those items available for patch testing. Will contact orthodontist regarding what items to patch test for and if they can provide sample. We do have nickel in the metal patch test panel that we can test for.  Return for patch testing - metal panel and if orthodontist can provide additional items then will patch test for those items as well.    Mild intermittent asthma without complication Stable. Today's spirometry showed some mild restriction.  May use albuterol rescue inhaler 2 puffs every 4 to 6 hours as needed for shortness of breath, chest tightness, coughing, and wheezing. Monitor frequency of use.   Elevated blood pressure reading Follow up with PCP regarding elevated blood pressure.  Return for Patch testing.  No orders of the defined types were placed in this  encounter.  Lab Orders  No laboratory test(s) ordered today    Diagnostics: Spirometry:  Tracings reviewed. Her effort: Good reproducible efforts. FVC: 1.70L FEV1: 1.31L, 57% predicted FEV1/FVC ratio: 77% Interpretation: Spirometry consistent with possible restrictive disease.  Please see scanned spirometry results for details.  Medication List:  Current Outpatient Medications  Medication Sig Dispense Refill   albuterol (VENTOLIN HFA) 108 (90 Base) MCG/ACT inhaler      Blood Pressure Monitoring (BLOOD PRESSURE CUFF) MISC Measure BP at home 3 times weekly. 1 each 0   cetirizine (ZYRTEC) 10 MG tablet      Cyanocobalamin (B-12 COMPLIANCE INJECTION) 1000 MCG/ML KIT Inject 1,000 mcg as directed every 30 (thirty) days.     cyclobenzaprine (FLEXERIL) 10 MG tablet Take 1 tablet (10 mg total) by mouth 3 (three) times daily as needed for muscle spasms. 30 tablet 0   EPINEPHrine 0.3 mg/0.3 mL IJ SOAJ injection Inject 0.3 mLs (0.3 mg total) into the muscle as needed for anaphylaxis. 2 each 1   ferrous sulfate 325 (65 FE) MG tablet Take 325 mg by mouth 2 (two) times daily.     ibuprofen (ADVIL) 800 MG tablet Take 800 mg by mouth every 8 (eight) hours as needed.     meloxicam (MOBIC) 15 MG tablet Take 1 tablet (15 mg total) by mouth daily. 15 tablet 0   Multiple Vitamin (MULTIVITAMIN WITH MINERALS) TABS tablet Take 1 tablet by mouth daily.     triamcinolone cream (KENALOG) 0.5 % Apply 1 application topically 3 (three) times daily. 454 g 1   No current facility-administered medications for this visit.   Allergies: Allergies  Allergen Reactions   Epinephrine Other (See Comments)    Shuts body down   Shellfish Allergy Itching and Swelling   Codeine Hives and Itching    Other reaction(s): Unknown   Latex Swelling and Hives    Per allergist   Lidocaine Swelling    Disorientation, shocks system  OK to use mepivacaine  Other reaction(s): Unknown   Metronidazole Hives and Itching    Other  reaction(s): Unknown   Morphine     Shuts system down Other reaction(s): Unknown   Other     malignant hyperthermia   Oxycodone     Other reaction(s): Unknown   Oxycodone-Acetaminophen     Shuts system down   Sulfonamide Derivatives Hives and Itching   I reviewed her past medical history, social history, family history, and environmental history and no significant changes have been reported from her previous visit.  Review of Systems  Constitutional:  Negative for appetite change, chills, fever and unexpected weight change.  HENT:  Negative for congestion and rhinorrhea.   Eyes:  Negative for itching.  Respiratory:  Negative for cough, chest tightness, shortness of breath and wheezing.   Gastrointestinal:  Negative for abdominal pain.  Skin:  Negative for rash.  Neurological:  Negative for headaches.   Objective: BP (!) 150/90   Pulse (!) 56   Temp 97.6 F (36.4 C) (Temporal)   Resp 16   Ht 5' 6.5" (1.689 m)   Wt 186 lb 6.4 oz (84.6 kg)   SpO2 98%  BMI 29.64 kg/m  Body mass index is 29.64 kg/m. Physical Exam Vitals and nursing note reviewed.  Constitutional:      Appearance: Normal appearance. She is well-developed.  HENT:     Head: Normocephalic and atraumatic.     Right Ear: Tympanic membrane and external ear normal.     Left Ear: Tympanic membrane and external ear normal.     Nose: Nose normal.     Mouth/Throat:     Mouth: Mucous membranes are moist.     Pharynx: Oropharynx is clear.     Comments: No rash/swelling/ulcers noted of the gum/oral cavity. Eyes:     Conjunctiva/sclera: Conjunctivae normal.  Cardiovascular:     Rate and Rhythm: Normal rate and regular rhythm.     Heart sounds: Normal heart sounds. No murmur heard. Pulmonary:     Effort: Pulmonary effort is normal.     Breath sounds: Normal breath sounds. No wheezing, rhonchi or rales.  Musculoskeletal:     Cervical back: Neck supple.  Skin:    General: Skin is warm.     Findings: No rash.   Neurological:     Mental Status: She is alert and oriented to person, place, and time.  Psychiatric:        Behavior: Behavior normal.  Previous notes and tests were reviewed. The plan was reviewed with the patient/family, and all questions/concerned were addressed.  It was my pleasure to see Shalayne today and participate in her care. Please feel free to contact me with any questions or concerns.  Sincerely,  Rexene Alberts, DO Allergy & Immunology  [facility] office: [place] office: [phone]

## 2020-09-17 NOTE — Telephone Encounter (Signed)
Please call orthodontist - Dr. [name] at [facility] [phone]  I received information about the items that were used on the patient - I am unable to patch test for those things as I don't have those items in the office. If they can provide samples then I can use them for the patch testing.  They also wanted to check for nickel to see if she can just have the traditional braces - I have a metal patch test panel that we can test patient for if that would be helpful.  Please ask how they would like to proceed? Metal patch test only or wait for the samples of what were used as well.  Thank you.

## 2020-09-17 NOTE — Assessment & Plan Note (Signed)
Stable.  Today's spirometry showed some mild restriction.  May use albuterol rescue inhaler 2 puffs every 4 to 6 hours as needed for shortness of breath, chest tightness, coughing, and wheezing. Monitor frequency of use.

## 2020-09-18 NOTE — Telephone Encounter (Signed)
I have contacted the orthodontist and am waiting on a call back from the clinical manger that can gave the samples. Would metal testing & the custom samples be done at the same time?

## 2020-09-18 NOTE — Telephone Encounter (Signed)
Yes. I would do at the same time.

## 2020-09-18 NOTE — Telephone Encounter (Signed)
Per Dr. Maudie Mercury,   "Please call orthodontist - Dr. [name] at [facility] [phone]   I received information about the items that were used on the patient - I am unable to patch test for those things as I don't have those items in the office. If they can provide samples then I can use them for the patch testing.   They also wanted to check for nickel to see if she can just have the traditional braces - I have a metal patch test panel that we can test patient for if that would be helpful.   Please ask how they would like to proceed? Metal patch test only or wait for the samples of what were used as well.   Thank you."

## 2020-09-18 NOTE — Telephone Encounter (Signed)
Contacted orthodontist and will pick up samples of gloves, l-pop, and composite from their office. I will contact patient tomorrow to schedule an appointment for patch testing fro metal and custom samples. Per Dr. Maudie Mercury, patient can see her or a NP.

## 2020-09-18 NOTE — Telephone Encounter (Signed)
Yes can be done at same time

## 2020-09-19 NOTE — Telephone Encounter (Addendum)
Patient is scheduled 8/15 at 1:30pm in [place] office

## 2020-09-19 NOTE — Telephone Encounter (Signed)
Picked up samples from [facility]. Samples have been placed in Dr. Josephina Gip office. I will contact patient to schedule a patch test later today in hopes of a cancellation, as we do not have any openings until the end of August.

## 2020-09-24 ENCOUNTER — Encounter: Payer: Self-pay | Admitting: Allergy

## 2020-09-24 ENCOUNTER — Other Ambulatory Visit: Payer: Self-pay

## 2020-09-24 ENCOUNTER — Ambulatory Visit (INDEPENDENT_AMBULATORY_CARE_PROVIDER_SITE_OTHER): Payer: 59 | Admitting: Allergy

## 2020-09-24 VITALS — BP 126/82 | HR 74 | Temp 97.8°F | Ht 66.5 in | Wt 175.0 lb

## 2020-09-24 DIAGNOSIS — L239 Allergic contact dermatitis, unspecified cause: Secondary | ICD-10-CM | POA: Insufficient documentation

## 2020-09-24 NOTE — Progress Notes (Signed)
   Follow Up Note  RE: [name] MRN: [id number] DOB: 1960-08-07 Date of Office Visit: 09/24/2020  Referring provider: [name], MD Primary care provider: [name], MD  History of Present Illness: I had the pleasure of seeing Meghan [name] for a follow up visit at the [facility] [place] [facility] [place] on 09/24/2020. She is a 60 y.o. female, who is being followed for possible dermatitis. Today she is here for patch test placement, given suspected history of contact dermatitis.   Samples of bond ESPE, etch and composite universal flo obtained from orthodontist office.   Diagnostics: Metal Test patches and others placed.   Assessment and Plan: Meghan [name] is a 60 y.o. female with: Allergic contact dermatitis Past history - Lip angioedema with lip/tongue tingling after procedure at orthodontist putting invisalign. Denies any itching, rash or trouble breathing. Symptoms resolved after 1 day with taking Benadryl. History of multiple adverse reactions to medications and swelling with latex. Metal patches, bond ESPE, etch and composite universal flo samples placed today. Bond ESPE, etch and composite universal flo dilutions were also placed.  Please avoid strenuous physical activities and do not get the patches on the back wet. No showering until final patch reading done. Okay to take antihistamines for itching but avoid placing any creams on the back where the patches are. We will remove the patches on Wednesday and will do our initial read. Then you will come back on Friday for a final read.  It was my pleasure to see Meghan [name] today and participate in her care. Please feel free to contact me with any questions or concerns.  Sincerely,  Rexene Alberts, DO Allergy & Immunology  [facility] office: [phone] [facility] office: [place] office: [phone]

## 2020-09-24 NOTE — Assessment & Plan Note (Signed)
Past history - Lip angioedema with lip/tongue tingling after procedure at orthodontist putting invisalign. Denies any itching, rash or trouble breathing. Symptoms resolved after 1 day with taking Benadryl. History of multiple adverse reactions to medications and swelling with latex. . Metal patches, bond ESPE, etch and composite universal flo samples placed today. o Bond ESPE, etch and composite universal flo dilutions were also placed.  . Please avoid strenuous physical activities and do not get the patches on the back wet. No showering until final patch reading done. Faythe Ghee to take antihistamines for itching but avoid placing any creams on the back where the patches are. . We will remove the patches on Wednesday and will do our initial read. . Then you will come back on Friday for a final read.

## 2020-09-24 NOTE — Patient Instructions (Signed)
Patches placed today. Please avoid strenuous physical activities and do not get the patches on the back wet. No showering until final patch reading done. Okay to take antihistamines for itching but avoid placing any creams on the back where the patches are. We will remove the patches on Wednesday and will do our initial read. Then you will come back on Friday for a final read. 

## 2020-09-25 NOTE — Progress Notes (Signed)
   Follow Up Note  RE: Meghan [name] MRN: [id number] DOB: 03-23-1960 Date of Office Visit: 09/26/2020  Referring provider: [name], MD Primary care provider: [name], MD  History of Present Illness: I had the pleasure of seeing Meghan [name] for a follow up visit at the [facility] [place] [facility] [place] on 09/26/2020. She is a 60 y.o. female, who is being followed for possible dermatitis. Today she is here for initial patch test interpretation, given suspected history of contact dermatitis.   Diagnostics:  Metal and select others panel 48 hour reading:   Metals Patch - 09/26/20 1400     Time Antigen Placed 1421    Manufacturer --   [employer]   Location Back    Number of Test 17    Lot # [id number]    Reading Interval Day 3    Select Select    Aluminum Hydroxide 10% 0    Chromium chloride 1% 0    Cobalt chloride hexahydrate 1% 0    Molybdenum chloride 0.5% 0    Nickel sulfate hexahydrate 5% 0    Potassium dichromate 0.25% 0    Copper sulfate pentahydrate 2% 0    Tantal 1% 0    Titanium 0.1% 0    Manganese chloride 0.5% 0    Vanadium Pentoxide 10% 0    Other 0    Other 1              Assessment and Plan: Meghan [name] is a 60 y.o. female with: Allergic contact dermatitis Patches removed today. Positive to etch only.  Return in about 2 days (around 09/28/2020) for Patch reading.  It was my pleasure to see Meghan [name] today and participate in her care. Please feel free to contact me with any questions or concerns.  Sincerely,  Rexene Alberts, DO Allergy & Immunology  [facility] office: [phone] [facility] office: [place] office: [phone]

## 2020-09-26 ENCOUNTER — Other Ambulatory Visit: Payer: Self-pay

## 2020-09-26 ENCOUNTER — Encounter: Payer: Self-pay | Admitting: Allergy

## 2020-09-26 ENCOUNTER — Ambulatory Visit: Payer: 59 | Admitting: Allergy

## 2020-09-26 DIAGNOSIS — L239 Allergic contact dermatitis, unspecified cause: Secondary | ICD-10-CM

## 2020-09-26 NOTE — Assessment & Plan Note (Signed)
Patches removed today. Positive to etch only.

## 2020-09-28 ENCOUNTER — Encounter: Payer: Self-pay | Admitting: Allergy

## 2020-09-28 ENCOUNTER — Ambulatory Visit: Payer: 59 | Admitting: Allergy

## 2020-09-28 ENCOUNTER — Other Ambulatory Visit: Payer: Self-pay

## 2020-09-28 ENCOUNTER — Encounter: Payer: 59 | Admitting: Family

## 2020-09-28 DIAGNOSIS — L239 Allergic contact dermatitis, unspecified cause: Secondary | ICD-10-CM

## 2020-09-28 NOTE — Progress Notes (Signed)
   Follow Up Note  RE: Meghan [name]  MRN: [id number] DOB: August 22, 1960 Date of Office Visit: 09/28/2020  Referring provider: [name], MD Primary care provider: [name], MD  History of Present Illness: I had the pleasure of seeing Meghan [name] for a follow up visit at the [facility] [place] [facility] [place] on 09/28/2020. She is a 60 y.o. female, who is being followed for possible dermatitis. Today she is here for final patch test interpretation, given suspected history of contact dermatitis.   Diagnostics:  Metal and select others panel 96 hour reading:     Aluminum Hydroxide 10% 0    Chromium chloride 1% 0    Cobalt chloride hexahydrate 1% 0    Molybdenum chloride 0.5% 0    Nickel sulfate hexahydrate 5% 0    Potassium dichromate 0.25% 0    Copper sulfate pentahydrate 2% 0    Tantal 1% 0    Titanium 0.1% 0    Manganese chloride 0.5% 0    Vanadium Pentoxide 10% 0    Other 0    Other (etch positive)  1    Remains 2+ to etch          Assessment and Plan: Meghan [name] is a 60 y.o. female with: Allergic contact dermatitis  Positive to etch only.  Advise she avoid this product.  Sincerely, Prudy Feeler, MD [facility] [place]

## 2020-10-03 NOTE — Telephone Encounter (Signed)
Patient completed the final patch test on 09/28/2020 and was positive to etch. Patient would like these results sent to her in [place].  Also, patient would like to speak with someone because she developed a knot on her eye lid the Monday that the patch was applied. Her eye was also swollen and had drainage. Patient states her eyes are still draining and her back is still burning and itching. She has applied cortizone cream, which helps a little, but it is still there. Patient would like to know what to do to help this.

## 2020-10-03 NOTE — Telephone Encounter (Signed)
Please call patient.  The patch testing should not have affected anything other than her localized reaction on the back.  The eye issue is most likely not related to the patches.  Keep appointment with Dr. Ernst Bowler. Take pictures of the swelling.   She may apply over the counter hydrocortisone cream on the back.  She can use over the counter refresh/rewetting eye drops.

## 2020-10-04 ENCOUNTER — Ambulatory Visit: Payer: 59 | Admitting: Allergy & Immunology

## 2020-10-04 DIAGNOSIS — J309 Allergic rhinitis, unspecified: Secondary | ICD-10-CM

## 2020-10-09 ENCOUNTER — Other Ambulatory Visit: Payer: Self-pay

## 2020-10-09 ENCOUNTER — Ambulatory Visit (INDEPENDENT_AMBULATORY_CARE_PROVIDER_SITE_OTHER): Payer: 59

## 2020-10-09 DIAGNOSIS — E538 Deficiency of other specified B group vitamins: Secondary | ICD-10-CM

## 2020-10-09 MED ORDER — CYANOCOBALAMIN 1000 MCG/ML IJ SOLN
1000.0000 ug | Freq: Once | INTRAMUSCULAR | Status: AC
Start: 2020-10-09 — End: 2020-10-09
  Administered 2020-10-09: 1000 ug via INTRAMUSCULAR

## 2020-10-09 NOTE — Progress Notes (Signed)
Pt is here for a b12 injection today.     Date of last office visit that b12 was discussed 11/07/2019.   Last injection was 09/10/2020.   Injection in LD without complication.   Patient scheduled for PCP visit on 9/22.

## 2020-10-11 ENCOUNTER — Ambulatory Visit: Payer: No Typology Code available for payment source

## 2020-10-19 ENCOUNTER — Other Ambulatory Visit: Payer: Self-pay

## 2020-10-19 DIAGNOSIS — M549 Dorsalgia, unspecified: Secondary | ICD-10-CM

## 2020-10-22 MED ORDER — CYCLOBENZAPRINE HCL 10 MG PO TABS: 10.0000 mg | ORAL_TABLET | Freq: Three times a day (TID) | ORAL | 0 refills | Status: DC | PRN

## 2020-10-30 ENCOUNTER — Telehealth: Payer: Self-pay | Admitting: Allergy

## 2020-10-30 ENCOUNTER — Telehealth: Payer: Self-pay | Admitting: *Deleted

## 2020-10-30 NOTE — Telephone Encounter (Signed)
Reviewed document.  Called the orthodontist office and left VM to call us back at the OR office.

## 2020-10-30 NOTE — Telephone Encounter (Signed)
Please call the orthodontist office.  Samples of bond ESPE, etch and composite universal flo obtained from orthodontist office.   Patch testing was positive to etch sample only.

## 2020-10-30 NOTE — Telephone Encounter (Signed)
Spoke with Dr. Bridgett Larsson on the phone.  They do have to use the etch for the bonding.  Recommended to avoid using the etch product as it caused contact irritation and it will probably cause more irritation especially in the oral mucosa.  She will call the patient and go over treatment options with her.

## 2020-10-30 NOTE — Telephone Encounter (Signed)
Patient states she had an appointment with her dentist this morning but was unable to have any work done to her. Patient stated the dentist office had faxed over a request to know how severe patient's reaction was to the bond ESPE, etch and composite universal flo samples on 10-05-2020. Dentist office is waiting to hear back from Dr. Maudie Mercury so that they may move forward with patient's treatment. The office would like a call from Dr. Maudie Mercury as soon as possible.   [facility] - [address] [place], [place], [place] [address] [phone]

## 2020-10-30 NOTE — Telephone Encounter (Signed)
I found the document in the faxes and I faxed it over to [facility] for Dr. Maudie Mercury to review.

## 2020-10-30 NOTE — Telephone Encounter (Signed)
Error

## 2020-11-01 ENCOUNTER — Ambulatory Visit: Payer: No Typology Code available for payment source | Admitting: Family Medicine

## 2020-11-12 ENCOUNTER — Ambulatory Visit: Payer: No Typology Code available for payment source | Admitting: Family Medicine

## 2020-11-19 ENCOUNTER — Encounter: Payer: Self-pay | Admitting: Family Medicine

## 2020-11-19 ENCOUNTER — Ambulatory Visit (INDEPENDENT_AMBULATORY_CARE_PROVIDER_SITE_OTHER): Payer: 59 | Admitting: Family Medicine

## 2020-11-19 ENCOUNTER — Other Ambulatory Visit: Payer: Self-pay

## 2020-11-19 VITALS — BP 165/87 | HR 59 | Ht 67.0 in | Wt 183.2 lb

## 2020-11-19 DIAGNOSIS — Z13220 Encounter for screening for lipoid disorders: Secondary | ICD-10-CM

## 2020-11-19 DIAGNOSIS — E538 Deficiency of other specified B group vitamins: Secondary | ICD-10-CM | POA: Diagnosis not present

## 2020-11-19 DIAGNOSIS — D500 Iron deficiency anemia secondary to blood loss (chronic): Secondary | ICD-10-CM

## 2020-11-19 DIAGNOSIS — R030 Elevated blood-pressure reading, without diagnosis of hypertension: Secondary | ICD-10-CM | POA: Diagnosis not present

## 2020-11-19 MED ORDER — CYANOCOBALAMIN 1000 MCG/ML IJ SOLN
1000.0000 ug | Freq: Once | INTRAMUSCULAR | Status: AC
  Administered 2020-11-19: 1000 ug via INTRAMUSCULAR

## 2020-11-19 NOTE — Patient Instructions (Signed)
I recommend vaccinations.  You are due for: COVID series Flu shot every year Shingles 2 shots once and done Please take your blood pressure multiple times at home.  I am looking for an average of less than 140/90,  I will call with blood test results. Good seeing you.  Enjoy those grandkids.

## 2020-11-20 ENCOUNTER — Encounter: Payer: Self-pay | Admitting: Family Medicine

## 2020-11-20 LAB — CBC
Hematocrit: 34.1 % (ref 34.0–46.6)
Hemoglobin: 11.4 g/dL (ref 11.1–15.9)
MCH: 28.9 pg (ref 26.6–33.0)
MCHC: 33.4 g/dL (ref 31.5–35.7)
MCV: 86 fL (ref 79–97)
Platelets: 272 10*3/uL (ref 150–450)
RBC: 3.95 x10E6/uL (ref 3.77–5.28)
RDW: 14.5 % (ref 11.7–15.4)
WBC: 4.9 10*3/uL (ref 3.4–10.8)

## 2020-11-20 LAB — BASIC METABOLIC PANEL
BUN/Creatinine Ratio: 12 (ref 12–28)
BUN: 9 mg/dL (ref 8–27)
CO2: 24 mmol/L (ref 20–29)
Calcium: 9.8 mg/dL (ref 8.7–10.3)
Chloride: 104 mmol/L (ref 96–106)
Creatinine, Ser: 0.77 mg/dL (ref 0.57–1.00)
Glucose: 102 mg/dL — ABNORMAL HIGH (ref 70–99)
Potassium: 4.3 mmol/L (ref 3.5–5.2)
Sodium: 141 mmol/L (ref 134–144)
eGFR: 88 mL/min/{1.73_m2} (ref >59)

## 2020-11-20 LAB — LIPID PANEL
Chol/HDL Ratio: 3.7 ratio (ref 0.0–4.4)
Cholesterol, Total: 125 mg/dL (ref 100–199)
HDL: 34 mg/dL — ABNORMAL LOW (ref >39)
LDL Chol Calc (NIH): 75 mg/dL (ref 0–99)
Triglycerides: 81 mg/dL (ref 0–149)
VLDL Cholesterol Cal: 16 mg/dL (ref 5–40)

## 2020-11-20 MED ORDER — FERROUS SULFATE 325 (65 FE) MG PO TABS: 325.0000 mg | ORAL_TABLET | ORAL | Status: DC

## 2020-11-20 NOTE — Assessment & Plan Note (Signed)
Due for lipids.  74 y ASCVD risk based on rsults are 3.0%,  No statin recommended.

## 2020-11-20 NOTE — Assessment & Plan Note (Signed)
Called with normal hgb.  Recommend decrease iron to qod.

## 2020-11-20 NOTE — Assessment & Plan Note (Signed)
Check home BPs.  If elevated, will need to begin Rx (beyond lifestyle modifications.)

## 2020-11-20 NOTE — Assessment & Plan Note (Signed)
Did not measure levels.  CBC shows normal Hgb and normal indices.  Adaquate replaced.  Continue monthly injections.

## 2020-11-20 NOTE — Progress Notes (Signed)
    SUBJECTIVE:   CHIEF COMPLAINT / HPI:   Needed appointment to continue to receive B12 injections. No complaints.  Issues: BP elevated.  She knows she has borderline high BP.  Has controled at home with supplemental Mg++.  Has not taken home BP recently.  No CP or DOE. Anemia hx.  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  Up to date on colon cancer screen.   Refuses flu and COVID vaccinations.  Has known asthma plus age 60 puts at high risk for serious disease.    OBJECTIVE:   BP (!) 165/87   Pulse (!) 59   Ht 5\' 7"  (1.702 m)   Wt 183 lb 3.2 oz (83.1 kg)   SpO2 99%   BMI 28.69 kg/m   Lungs clear Cardiac RRR without m or g  ASSESSMENT/PLAN:   Vitamin B 12 deficiency Did not measure levels.  CBC shows normal Hgb and normal indices.  Adaquate replaced.  Continue monthly injections.  Screening cholesterol level Due for lipids.  40 y ASCVD risk based on rsults are 3.0%,  No statin recommended.  Iron deficiency anemia Called with normal hgb.  Recommend decrease iron to qod.  Elevated blood pressure reading Check home BPs.  If elevated, will need to begin Rx (beyond lifestyle modifications.)     [name], MD [place]

## 2020-12-18 ENCOUNTER — Ambulatory Visit: Payer: 59 | Admitting: Allergy and Immunology

## 2021-01-02 ENCOUNTER — Other Ambulatory Visit: Payer: Self-pay

## 2021-01-02 ENCOUNTER — Ambulatory Visit (INDEPENDENT_AMBULATORY_CARE_PROVIDER_SITE_OTHER): Payer: 59 | Admitting: Family Medicine

## 2021-01-02 ENCOUNTER — Encounter: Payer: Self-pay | Admitting: Family Medicine

## 2021-01-02 VITALS — BP 143/84 | Ht 67.0 in | Wt 187.5 lb

## 2021-01-02 DIAGNOSIS — M549 Dorsalgia, unspecified: Secondary | ICD-10-CM

## 2021-01-02 DIAGNOSIS — S29012A Strain of muscle and tendon of back wall of thorax, initial encounter: Secondary | ICD-10-CM

## 2021-01-02 MED ORDER — MELOXICAM 7.5 MG PO TABS: ORAL_TABLET | ORAL | 0 refills | Status: DC

## 2021-01-02 MED ORDER — KETOROLAC TROMETHAMINE 30 MG/ML IJ SOLN
15.0000 mg | Freq: Once | INTRAMUSCULAR | Status: AC
  Administered 2021-01-02: 15 mg via INTRAMUSCULAR

## 2021-01-02 NOTE — Patient Instructions (Signed)
We are going to give you an injection of a powerful anti-inflammatory today.  I do not want you to take any ibuprofen or naproxen for the rest of today.  Tomorrow you can start the meloxicam that I sent in and take that for the next 3 to 4 days.  I recommend that you continue to ice and stretch the area as you can.  Do not take any naproxen or ibuprofen while you are taking the meloxicam.  I would like for you to follow-up in 1 week if you are still having significant pain.  I recommend avoiding alcohol while on the meloxicam.  If you develop any loss of bowel or bladder function, numbness to your genital region, fevers, or worsening back pain I would like for you to be seen sooner.

## 2021-01-02 NOTE — Telephone Encounter (Signed)
Noted and agree. 

## 2021-01-02 NOTE — Progress Notes (Signed)
    SUBJECTIVE:   CHIEF COMPLAINT / HPI:   Muscle spasm: 60 year old female presenting for the above.  She states that she had been trying Flexeril but it did not seem to be helping.  Her pain is mostly in her mid back and often travels up leading to tension in her neck but this time it is in her mid-lower back. Started 4 days ago. She denies trauma. She tried 1 ibuprofen this morning.  She denies any loss of bowel or bladder symptoms.  Denies any saddle paresthesias.  She states pain is mostly left-sided and thoracic and lumbar region in the musculature.  Denies midline tenderness.  Denies any falls or traumas. Previously received Toradol injection meloxicam last year for a series of muscle spasms with her neck.  She denies any history of stomach ulcers, heart attack, stroke, heart failure.    PERTINENT  PMH / PSH: None relevant  OBJECTIVE:   BP (!) 143/84   Ht 5\' 7"  (1.702 m)   Wt 187 lb 8 oz (85 kg)   BMI 29.37 kg/m    General: NAD, pleasant, able to participate in exam Respiratory: No respiratory distress MSK: Some pain with palpation of the musculature lateral to the thoracic spine on the right as well as discomfort with palpating musculature lateral to the lumbar spine on both the left and right.  No midline tenderness.  Negative straight leg raise testing. Psych: Normal affect and mood    ASSESSMENT/PLAN:   Thoracic/lumbar sprain: Has been present for about 4 days.  She is only tried 1 dose of ibuprofen which did not improve the symptoms.  No red flag symptoms.  No trauma preceding it.  No need for imaging at this point.  She does not have any history of stomach ulcers, heart failure, heart attack/stroke and recent creatinine was appropriate.  Discussed multiple treatment modalities and we opted for a dose of IM Toradol today followed by 4-day course of meloxicam.  She did have mildly elevated blood pressure today but seem to be in the setting of discomfort.  Recommend she follows  up in 1 week if she is still having significant pain.   Lurline Del, [place]

## 2021-01-02 NOTE — Telephone Encounter (Signed)
Patient calls nurse line regarding muscle spasms. Reports that this has been going on for the last three days and that flexeril has not been working.   Patient is requesting to be seen for different medication or injection. Patient was seen in 10/2019 for similar symptoms and received Toradol.   Scheduled patient for same day appointment this afternoon with Dr. Vanessa Elwood.   Talbot Grumbling, RN

## 2021-01-24 ENCOUNTER — Other Ambulatory Visit: Payer: Self-pay | Admitting: *Deleted

## 2021-01-24 MED ORDER — ALBUTEROL SULFATE HFA 108 (90 BASE) MCG/ACT IN AERS: 2.0000 | INHALATION_SPRAY | Freq: Four times a day (QID) | RESPIRATORY_TRACT | 6 refills | Status: DC | PRN

## 2021-01-24 MED ORDER — MELOXICAM 7.5 MG PO TABS: ORAL_TABLET | ORAL | 3 refills | Status: DC

## 2021-01-24 NOTE — Telephone Encounter (Signed)
Patient is callling for a refill of her inhaler and her meloxicam.  She states that her script from November was only for 4 pills and that wasn't enough for her for the whole month.  Will forward to MD to advise.  Pharmacy confirmed.  Tonnia Bardin,CMA

## 2021-01-28 ENCOUNTER — Telehealth: Payer: Self-pay

## 2021-01-28 MED ORDER — MELOXICAM 7.5 MG PO TABS: 7.5000 mg | ORAL_TABLET | Freq: Every day | ORAL | 3 refills | Status: DC

## 2021-01-28 NOTE — Telephone Encounter (Signed)
Received phone call from pharmacy regarding rx for meloxicam. Current directions state that start on 11/24. Please update rx with directions for frequency of use. Please send new rx to [facility] on [address].   Thanks.   Talbot Grumbling, RN

## 2021-01-28 NOTE — Telephone Encounter (Signed)
Done

## 2021-02-20 ENCOUNTER — Other Ambulatory Visit: Payer: Self-pay

## 2021-02-20 ENCOUNTER — Ambulatory Visit (INDEPENDENT_AMBULATORY_CARE_PROVIDER_SITE_OTHER): Payer: Self-pay

## 2021-02-20 DIAGNOSIS — E538 Deficiency of other specified B group vitamins: Secondary | ICD-10-CM

## 2021-02-21 MED ORDER — CYANOCOBALAMIN 1000 MCG/ML IJ SOLN
1000.0000 ug | Freq: Once | INTRAMUSCULAR | Status: AC
  Administered 2021-02-20: 1000 ug via INTRAMUSCULAR

## 2021-02-21 NOTE — Progress Notes (Signed)
Pt is here for a b12 injection today.    Date of last office visit that b12 was discussed 11/19/2020  Last injection was 11/19/2020  Injection given in RD deltoid, pt tolerated well and scheduled for next nurse visit on 03/25/2021 at 3:30 pm.   Talbot Grumbling, RN

## 2021-03-01 ENCOUNTER — Other Ambulatory Visit: Payer: Self-pay | Admitting: Family Medicine

## 2021-03-01 DIAGNOSIS — Z1231 Encounter for screening mammogram for malignant neoplasm of breast: Secondary | ICD-10-CM

## 2021-03-12 ENCOUNTER — Ambulatory Visit
Admission: RE | Admit: 2021-03-12 | Discharge: 2021-03-12 | Disposition: A | Discharge status: Home / Self Care | Payer: 59 | Source: Ambulatory Visit | Attending: Family Medicine | Admitting: Family Medicine

## 2021-03-12 ENCOUNTER — Other Ambulatory Visit: Payer: Self-pay

## 2021-03-12 DIAGNOSIS — Z1231 Encounter for screening mammogram for malignant neoplasm of breast: Secondary | ICD-10-CM

## 2021-03-14 ENCOUNTER — Other Ambulatory Visit: Payer: Self-pay | Admitting: Family Medicine

## 2021-03-14 DIAGNOSIS — R928 Other abnormal and inconclusive findings on diagnostic imaging of breast: Secondary | ICD-10-CM

## 2021-03-20 ENCOUNTER — Other Ambulatory Visit: Payer: Self-pay

## 2021-03-20 ENCOUNTER — Ambulatory Visit (INDEPENDENT_AMBULATORY_CARE_PROVIDER_SITE_OTHER): Payer: 59

## 2021-03-20 DIAGNOSIS — E538 Deficiency of other specified B group vitamins: Secondary | ICD-10-CM | POA: Diagnosis not present

## 2021-03-20 MED ORDER — CYANOCOBALAMIN 1000 MCG/ML IJ SOLN
1000.0000 ug | Freq: Once | INTRAMUSCULAR | Status: AC
  Administered 2021-03-20: 1000 ug via INTRAMUSCULAR

## 2021-03-20 NOTE — Progress Notes (Signed)
Pt is here for a b12 injection today.    Date of last office visit that b12 was discussed 11/19/2020  Last injection was 02/20/2021  Injection given in LD deltoid, pt tolerated well and scheduled for next monthly injection on 04/17/21.   Talbot Grumbling, RN

## 2021-03-25 ENCOUNTER — Ambulatory Visit: Payer: No Typology Code available for payment source

## 2021-03-30 ENCOUNTER — Ambulatory Visit
Admission: RE | Admit: 2021-03-30 | Discharge: 2021-03-30 | Disposition: A | Discharge status: Home / Self Care | Payer: No Typology Code available for payment source | Source: Ambulatory Visit | Attending: Family Medicine | Admitting: Family Medicine

## 2021-03-30 DIAGNOSIS — R928 Other abnormal and inconclusive findings on diagnostic imaging of breast: Secondary | ICD-10-CM

## 2021-04-17 ENCOUNTER — Ambulatory Visit (INDEPENDENT_AMBULATORY_CARE_PROVIDER_SITE_OTHER): Payer: 59

## 2021-04-17 ENCOUNTER — Other Ambulatory Visit: Payer: Self-pay

## 2021-04-17 DIAGNOSIS — E538 Deficiency of other specified B group vitamins: Secondary | ICD-10-CM

## 2021-04-17 MED ORDER — CYANOCOBALAMIN 1000 MCG/ML IJ SOLN
1000.0000 ug | Freq: Once | INTRAMUSCULAR | Status: AC
  Administered 2021-04-17: 1000 ug via INTRAMUSCULAR

## 2021-04-17 NOTE — Progress Notes (Deleted)
? ? ?  SUBJECTIVE:  ? ?CHIEF COMPLAINT / HPI:  ? ?Right foot pain-  ? ?PERTINENT  PMH / PSH: OA, GERD, asthma ? ?OBJECTIVE:  ? ?There were no vitals taken for this visit.  ?General: alert & oriented, no apparent distress, well groomed ?HEENT: normocephalic, atraumatic, EOM grossly intact, oral mucosa moist, neck supple ?Respiratory: normal respiratory effort ?GI: non-distended ?Skin: no rashes, no jaundice ?Psych: appropriate mood and affect ? ?Right foot:  ? ?ASSESSMENT/PLAN:  ? ?No problem-specific Assessment & Plan notes found for this encounter. ?  ? ? ?Lenoria Chime, MD ?[place]  ? ?

## 2021-04-17 NOTE — Progress Notes (Signed)
Pt is here for a b12 injection today.   ? ?Date of last office visit that b12 was discussed 11/19/2020 ? ?Last injection was 03/20/2021 ? ?Injection given in RD deltoid, pt tolerated well and scheduled for next appointment on 05/20/21. ? ? Talbot Grumbling, RN ? ?

## 2021-04-18 ENCOUNTER — Ambulatory Visit: Payer: No Typology Code available for payment source | Admitting: Family Medicine

## 2021-04-19 ENCOUNTER — Other Ambulatory Visit: Payer: Self-pay | Admitting: Gastroenterology

## 2021-04-19 ENCOUNTER — Ambulatory Visit: Payer: No Typology Code available for payment source

## 2021-05-16 ENCOUNTER — Ambulatory Visit: Payer: No Typology Code available for payment source

## 2021-05-18 ENCOUNTER — Encounter: Payer: Self-pay | Admitting: Family Medicine

## 2021-05-20 ENCOUNTER — Ambulatory Visit (INDEPENDENT_AMBULATORY_CARE_PROVIDER_SITE_OTHER): Payer: 59

## 2021-05-20 DIAGNOSIS — E538 Deficiency of other specified B group vitamins: Secondary | ICD-10-CM

## 2021-05-20 MED ORDER — CYANOCOBALAMIN 1000 MCG/ML IJ SOLN
1000.0000 ug | Freq: Once | INTRAMUSCULAR | Status: AC
  Administered 2021-05-20: 1000 ug via INTRAMUSCULAR

## 2021-05-20 NOTE — Progress Notes (Signed)
Pt is here for a b12 injection today ?  ?Date of last office visit that b12 was discussed 11/19/2020 ?  ?Last injection was 04/17/2021 ?  ?Injection given in LD deltoid without complications ? ?Patient scheduled for May injection ?

## 2021-06-10 ENCOUNTER — Ambulatory Visit: Payer: 59

## 2021-06-13 ENCOUNTER — Ambulatory Visit (INDEPENDENT_AMBULATORY_CARE_PROVIDER_SITE_OTHER): Payer: Self-pay

## 2021-06-13 ENCOUNTER — Ambulatory Visit: Payer: 59

## 2021-06-13 DIAGNOSIS — E538 Deficiency of other specified B group vitamins: Secondary | ICD-10-CM

## 2021-06-13 MED ORDER — CYANOCOBALAMIN 1000 MCG/ML IJ SOLN
1000.0000 ug | Freq: Once | INTRAMUSCULAR | Status: AC
  Administered 2021-06-13: 1000 ug via INTRAMUSCULAR

## 2021-06-13 NOTE — Progress Notes (Signed)
Pt is here for a b12 injection today ?  ?Date of last office visit that b12 was discussed 11/19/2020 ?  ?Last injection was 05/20/2021. ?  ?Injection given in RD deltoid without complications ?  ?Patient reports she feels the injections are no longer working for her and she has had more episodes of excessive fatigue despite monthly injections.  ? ?Patient advised to schedule with PCP to discuss before next injection is due in June.  ?

## 2021-06-18 ENCOUNTER — Telehealth: Payer: Self-pay

## 2021-06-18 NOTE — Telephone Encounter (Signed)
Patient calls nurse line reporting right leg and foot numbness.  ? ?Patient reports she feels this is connected to the B12 shot she received on 5/4. Patient reports symptoms started right after.  ? ?Patient does report some mild swelling to her right ankle.  ? ?Patient denies any pain, redness to the area, SOB or chest pains.  ? ?Patient scheduled for tomorrow for evaluation.  ? ?Red flags discussed with patient in the meantime.  ?

## 2021-06-18 NOTE — Progress Notes (Signed)
    SUBJECTIVE:   CHIEF COMPLAINT / HPI:   Right Leg and Foot Numbness Meghan [name] is a 61 y.o. female who presents to the [facility] clinic today to discuss symptoms of right foot and leg numbness starting last Friday when she was driving. She has a history of B12 deficiency (last level checked 08/2019). Her last B12 shot was last Thursday. She has been getting them monthly for "a while". No injuries to her leg or ankle. Feels like she is unable to lift her foot up- it is causing difficulties walking. No other symptoms. No back pain.   Epi Pen Refill Has shellfish allergy. Other pen has expired.  PERTINENT  PMH / PSH: Osteoarthritis, GERD, B12 deficiency   OBJECTIVE:   BP (!) 172/92   Pulse (!) 58   Ht '5\' 7"'$  (1.702 m)   Wt 182 lb 12.8 oz (82.9 kg)   SpO2 100%   BMI 28.63 kg/m    General: NAD, pleasant, able to participate in exam Cardiac: RRR, no murmurs. Respiratory: CTAB, normal effort, No wheezes, rales or rhonchi Extremities: no edema or cyanosis. Neuro:  Right foot drop. Right foot unable to dorsiflex, minimal eversion. Sensation intact b/l feet, legs and thighs. Reflex symmetrical bilateral knees and ankles. Strength 5/5 in quads, hamstrings, legs b/l. Decreased strength in right foot-unable to dorsiflex.  Psych: Normal affect and mood  ASSESSMENT/PLAN:   Foot drop, right foot Differential is long and includes ALS, motor neuron disease, injury to the common peroneal nerve, diabetes, MS. No inciting injury.  Would benefit from EMG to better characterize.  We will get set up with PT for AFO in the meantime.  We will close follow-up in 2 weeks. Strict return precautions given to return if worsening symptoms or if occurring to other foot.   Shellfish allergy Epinephrine refilled today.  Hypertension Elevated blood pressure readings x2 today.  Asymptomatic.  Not on any antihypertensives.  Will start on amlodipine 5 mg.  Close follow-up in 2 weeks.    Sharion Settler,  [place]

## 2021-06-19 ENCOUNTER — Ambulatory Visit (INDEPENDENT_AMBULATORY_CARE_PROVIDER_SITE_OTHER): Payer: 59 | Admitting: Family Medicine

## 2021-06-19 VITALS — BP 172/92 | HR 58 | Ht 67.0 in | Wt 182.8 lb

## 2021-06-19 DIAGNOSIS — E538 Deficiency of other specified B group vitamins: Secondary | ICD-10-CM | POA: Diagnosis not present

## 2021-06-19 DIAGNOSIS — I10 Essential (primary) hypertension: Secondary | ICD-10-CM | POA: Insufficient documentation

## 2021-06-19 DIAGNOSIS — Z87892 Personal history of anaphylaxis: Secondary | ICD-10-CM | POA: Diagnosis not present

## 2021-06-19 DIAGNOSIS — M21371 Foot drop, right foot: Secondary | ICD-10-CM | POA: Diagnosis not present

## 2021-06-19 DIAGNOSIS — Z91013 Allergy to seafood: Secondary | ICD-10-CM | POA: Insufficient documentation

## 2021-06-19 MED ORDER — EPINEPHRINE 0.3 MG/0.3ML IJ SOAJ: 0.3000 mg | INTRAMUSCULAR | 1 refills | Status: AC | PRN

## 2021-06-19 MED ORDER — AMLODIPINE BESYLATE 5 MG PO TABS
5.0000 mg | ORAL_TABLET | Freq: Every day | ORAL | 3 refills | Status: DC
Start: 2021-06-19 — End: 2022-04-18

## 2021-06-19 NOTE — Assessment & Plan Note (Signed)
Epinephrine refilled today. ?

## 2021-06-19 NOTE — Addendum Note (Signed)
Addended by: [place] Pink on: 06/19/2021 04:01 PM ? ? Modules accepted: Orders ? ?

## 2021-06-19 NOTE — Assessment & Plan Note (Signed)
Elevated blood pressure readings x2 today.  Asymptomatic.  Not on any antihypertensives.  Will start on amlodipine 5 mg.  Close follow-up in 2 weeks. ?

## 2021-06-19 NOTE — Assessment & Plan Note (Signed)
Differential is long and includes ALS, motor neuron disease, injury to the common peroneal nerve, diabetes, MS. No inciting injury.  Would benefit from EMG to better characterize.  We will get set up with PT for AFO in the meantime.  We will close follow-up in 2 weeks. Strict return precautions given to return if worsening symptoms or if occurring to other foot.  ?

## 2021-06-19 NOTE — Patient Instructions (Signed)
It was wonderful to see you today. ? ?Please bring ALL of your medications with you to every visit.  ? ?Today we talked about: ? ?-I I am sending a referral to PT to get you fitted for an ankle-foot orthotic to help you walk. ?-I would like to get you set up for nerve conduction test to further assess this. ?-If at any point this gets worse, or progresses to your other foot I want you to come back immediately. ?-Your blood pressure was quite high today and has been high in the past.  I will start you on a medication called amlodipine.  Take 1 tablet daily. ?-Follow up in 2 weeks. ? ? ?Thank you for choosing [facility].  ? ?Please call [phone] with any questions about today's appointment. ? ?Please be sure to schedule follow up at the front  desk before you leave today.  ? ?Sharion Settler, DO ?PGY-2 Family Medicine   ?

## 2021-06-24 ENCOUNTER — Other Ambulatory Visit: Payer: Self-pay | Admitting: Family Medicine

## 2021-06-24 ENCOUNTER — Telehealth: Payer: Self-pay | Admitting: *Deleted

## 2021-06-24 DIAGNOSIS — M21371 Foot drop, right foot: Secondary | ICD-10-CM

## 2021-06-24 NOTE — Telephone Encounter (Signed)
Patient called and states that [facility] is out of network for [employer].  I was unaware that patient had this insurance and will try and find another place that offers the same services and will be in network.  She is curious if she can be referred to physical therapy in the meantime.  I will forward to the provider who saw patient for this visit.  Patient is still waiting to hear back from the headache clinic on whether or not they will take her insurance for the EMG.  She is aware that if not, she will be on a waitlist for neuro to perform this.  Christyanna Mckeon,CMA ? ?

## 2021-07-09 ENCOUNTER — Ambulatory Visit (INDEPENDENT_AMBULATORY_CARE_PROVIDER_SITE_OTHER): Payer: 59 | Admitting: Podiatry

## 2021-07-09 DIAGNOSIS — Z91199 Patient's noncompliance with other medical treatment and regimen due to unspecified reason: Secondary | ICD-10-CM

## 2021-07-10 NOTE — Progress Notes (Signed)
Patient was no-show for appointment today 

## 2021-07-16 ENCOUNTER — Encounter: Payer: Self-pay | Admitting: *Deleted

## 2021-07-24 ENCOUNTER — Ambulatory Visit: Payer: 59

## 2021-08-02 ENCOUNTER — Ambulatory Visit (HOSPITAL_BASED_OUTPATIENT_CLINIC_OR_DEPARTMENT_OTHER): Payer: 59 | Admitting: Physician Assistant

## 2021-08-02 ENCOUNTER — Other Ambulatory Visit: Payer: Self-pay

## 2021-08-02 ENCOUNTER — Encounter (HOSPITAL_COMMUNITY)
Admission: RE | Disposition: A | Discharge status: Home / Self Care | Payer: Self-pay | Source: Home / Self Care | Attending: Gastroenterology

## 2021-08-02 ENCOUNTER — Ambulatory Visit (HOSPITAL_COMMUNITY): Payer: 59 | Admitting: Physician Assistant

## 2021-08-02 ENCOUNTER — Ambulatory Visit (HOSPITAL_COMMUNITY)
Admission: RE | Admit: 2021-08-02 | Discharge: 2021-08-02 | Disposition: A | Discharge status: Home / Self Care | Payer: 59 | Attending: Gastroenterology | Admitting: Gastroenterology

## 2021-08-02 DIAGNOSIS — Z86010 Personal history of colonic polyps: Secondary | ICD-10-CM | POA: Insufficient documentation

## 2021-08-02 DIAGNOSIS — K449 Diaphragmatic hernia without obstruction or gangrene: Secondary | ICD-10-CM

## 2021-08-02 DIAGNOSIS — K317 Polyp of stomach and duodenum: Secondary | ICD-10-CM

## 2021-08-02 DIAGNOSIS — Z1211 Encounter for screening for malignant neoplasm of colon: Secondary | ICD-10-CM

## 2021-08-02 DIAGNOSIS — K2970 Gastritis, unspecified, without bleeding: Secondary | ICD-10-CM | POA: Insufficient documentation

## 2021-08-02 DIAGNOSIS — K5730 Diverticulosis of large intestine without perforation or abscess without bleeding: Secondary | ICD-10-CM | POA: Diagnosis not present

## 2021-08-02 DIAGNOSIS — D124 Benign neoplasm of descending colon: Secondary | ICD-10-CM | POA: Insufficient documentation

## 2021-08-02 DIAGNOSIS — Z87891 Personal history of nicotine dependence: Secondary | ICD-10-CM | POA: Insufficient documentation

## 2021-08-02 DIAGNOSIS — Z800 Family history of malignant neoplasm of digestive organs: Secondary | ICD-10-CM | POA: Insufficient documentation

## 2021-08-02 HISTORY — PX: HEMOSTASIS CLIP PLACEMENT: SHX6857

## 2021-08-02 HISTORY — PX: ESOPHAGOGASTRODUODENOSCOPY (EGD) WITH PROPOFOL: SHX5813

## 2021-08-02 HISTORY — PX: POLYPECTOMY: SHX5525

## 2021-08-02 HISTORY — PX: COLONOSCOPY WITH PROPOFOL: SHX5780

## 2021-08-02 SURGERY — ESOPHAGOGASTRODUODENOSCOPY (EGD) WITH PROPOFOL
Anesthesia: Monitor Anesthesia Care

## 2021-08-02 MED ORDER — AMISULPRIDE (ANTIEMETIC) 5 MG/2ML IV SOLN
5.0000 mg | Freq: Once | INTRAVENOUS | Status: AC
  Administered 2021-08-02: 5 mg via INTRAVENOUS
  Filled 2021-08-02: qty 2

## 2021-08-02 MED ORDER — PROPOFOL 500 MG/50ML IV EMUL
INTRAVENOUS | Status: AC
  Filled 2021-08-02: qty 50

## 2021-08-02 MED ORDER — SODIUM CHLORIDE 0.9 % IV SOLN: INTRAVENOUS | Status: DC

## 2021-08-02 MED ORDER — LACTATED RINGERS IV SOLN: INTRAVENOUS | Status: DC | PRN

## 2021-08-02 MED ORDER — PROPOFOL 500 MG/50ML IV EMUL
INTRAVENOUS | Status: DC | PRN
  Administered 2021-08-02: 150 ug/kg/min via INTRAVENOUS

## 2021-08-02 MED ORDER — PROPOFOL 10 MG/ML IV BOLUS
INTRAVENOUS | Status: DC | PRN
  Administered 2021-08-02: 25 mg via INTRAVENOUS
  Administered 2021-08-02: 75 mg via INTRAVENOUS
  Administered 2021-08-02: 50 mg via INTRAVENOUS

## 2021-08-02 MED ORDER — IBUPROFEN 600 MG PO TABS
600.0000 mg | ORAL_TABLET | Freq: Once | ORAL | Status: AC
  Administered 2021-08-02: 600 mg via ORAL
  Filled 2021-08-02: qty 1

## 2021-08-02 SURGICAL SUPPLY — 25 items

## 2021-08-02 NOTE — H&P (Signed)
Meghan [name] HPI: This 61 year old black female presents to the office for a 1 year EGD. She had an EGD done 02/18/2019 that revealed a hiatal hernia and multiple gastric fundic  gland polyps. Two of the larger ones were removed by snare polypectomy and pathology revealed these were hyperplastic polyps with focal intestinal metaplasia. She has 3 BM's per day with no obvious blood or mucus in the stool. She has a history of iron deficiency anemia. She is taking an over the counter Iron supplement and is also getting Vitamin B12 injections every month for pernicious anemia. She has good appetite and her weight has been stable. She denies having any complaints of abdominal pain, nausea, vomiting, acid reflux, dysphagia or odynophagia. She denies having a family history of celiac sprue, or IBD. Her brother was diagnosed with colon cancer at the age 67 and died at the age 20 [of complications of colon cancer].  She had an colonoscopy done on 02/18/2019 when 11 tubular adenomas were removed.  Past Medical History:  Diagnosis Date   Angio-edema    Asthma    Back pain 01/13/2012   Lichen planus    Malignant hyperthermia    1988, 1987   Unspecified sinusitis (chronic) 08/12/2012   Urticaria     Past Surgical History:  Procedure Laterality Date   ADENOIDECTOMY     ESOPHAGOGASTRODUODENOSCOPY (EGD) WITH PROPOFOL N/A 02/25/2019   Procedure: ESOPHAGOGASTRODUODENOSCOPY (EGD) WITH PROPOFOL;  Surgeon: Jeani Hawking, MD;  Location: [facility] ENDOSCOPY;  Service: Endoscopy;  Laterality: N/A;   HEMOSTASIS CLIP PLACEMENT  02/25/2019   Procedure: HEMOSTASIS CLIP PLACEMENT;  Surgeon: Jeani Hawking, MD;  Location: [facility] ENDOSCOPY;  Service: Endoscopy;;   POLYPECTOMY  02/25/2019   Procedure: POLYPECTOMY;  Surgeon: Jeani Hawking, MD;  Location: [facility];  Service: Endoscopy;;   TONSILLECTOMY  1987   TUBAL LIGATION  1988   UTERINE ARTERY EMBOLIZATION  2000   for fibroids     Family History  Problem Relation Age of Onset    Hypertension Mother    Kidney disease Mother    Cholecystitis Mother    Hypertension Father    Diabetes Father    Kidney disease Daughter     Social History:  reports that she quit smoking about 25 years ago. Her smoking use included cigarettes. She has never used smokeless tobacco. She reports current alcohol use of about 0.5 standard drinks of alcohol per week. She reports that she does not use drugs.  Allergies:  Allergies  Allergen Reactions   Epinephrine Other (See Comments)    Shuts body down   Shellfish Allergy Itching and Swelling   Codeine Hives and Itching   Latex Swelling and Hives    Per allergist   Lidocaine Swelling    Disorientation, shocks system  OK to use mepivacaine     Metronidazole Hives and Itching   Morphine     Shuts system down    Other     malignant hyperthermia   Oxycodone     Shut system down    Oxycodone-Acetaminophen     Shuts system down   Sulfonamide Derivatives Hives and Itching    Medications: Scheduled: Continuous:  sodium chloride      No results found for this or any previous visit (from the past 24 hour(s)).   No results found.  ROS:  As stated above in the HPI otherwise negative.  Blood pressure (!) 168/85, pulse 71, temperature 98.1 F (36.7 C), temperature source Temporal, resp. rate 14,  height 5\' 6"  (1.676 m), weight 82.6 kg, SpO2 98 %.    PE: Gen: NAD, Alert and Oriented HEENT:  [place]/AT, EOMI Neck: Supple, no LAD Lungs: CTA Bilaterally CV: RRR without M/G/R ABD: Soft, NTND, +BS Ext: No C/C/E  Assessment/Plan: 1) IDA - EGD/colonoscopy.  Hernando Reali D 08/02/2021, 9:34 AM

## 2021-08-02 NOTE — Anesthesia Procedure Notes (Signed)
Procedure Name: MAC Date/Time: 08/02/2021 11:12 AM  Performed by: Adria Dill, CRNAPre-anesthesia Checklist: Patient identified, Emergency Drugs available, Suction available and Patient being monitored Patient Re-evaluated:Patient Re-evaluated prior to induction Oxygen Delivery Method: Simple face mask Preoxygenation: Pre-oxygenation with 100% oxygen Induction Type: IV induction Airway Equipment and Method: Bite block Placement Confirmation: positive ETCO2 and breath sounds checked- equal and bilateral Dental Injury: Teeth and Oropharynx as per pre-operative assessment

## 2021-08-05 ENCOUNTER — Encounter (HOSPITAL_COMMUNITY): Payer: Self-pay | Admitting: Gastroenterology

## 2021-08-06 ENCOUNTER — Ambulatory Visit (INDEPENDENT_AMBULATORY_CARE_PROVIDER_SITE_OTHER): Payer: 59

## 2021-08-06 DIAGNOSIS — E538 Deficiency of other specified B group vitamins: Secondary | ICD-10-CM

## 2021-08-06 MED ORDER — CYANOCOBALAMIN 1000 MCG/ML IJ SOLN
1000.0000 ug | Freq: Once | INTRAMUSCULAR | Status: AC
  Administered 2021-08-06: 1000 ug via INTRAMUSCULAR

## 2021-08-06 NOTE — Progress Notes (Signed)
Pt is here for a b12 injection today   Date of last office visit that b12 was discussed 11/19/2020   Last injection was 06/13/2021.   Injection given in LD deltoid without complications

## 2021-08-07 LAB — SURGICAL PATHOLOGY

## 2021-08-12 NOTE — Anesthesia Postprocedure Evaluation (Signed)
Anesthesia Post Note  Patient: Meghan [name]  Procedure(s) Performed: ESOPHAGOGASTRODUODENOSCOPY (EGD) WITH PROPOFOL COLONOSCOPY WITH PROPOFOL POLYPECTOMY HEMOSTASIS CLIP PLACEMENT     Patient location during evaluation: Endoscopy Anesthesia Type: MAC Level of consciousness: awake and alert Pain management: pain level controlled Vital Signs Assessment: post-procedure vital signs reviewed and stable Respiratory status: spontaneous breathing, nonlabored ventilation, respiratory function stable and patient connected to nasal cannula oxygen Cardiovascular status: blood pressure returned to baseline and stable Postop Assessment: no apparent nausea or vomiting Anesthetic complications: no   No notable events documented.  Last Vitals:  Vitals:   08/02/21 1330 08/02/21 1335  BP: (!) 195/79 (!) 195/84  Pulse: (!) 52 (!) 51  Resp: 10 13  Temp:    SpO2: 100% 100%    Last Pain:  Vitals:   08/02/21 1330  TempSrc:   PainSc: 3                  Barnet Glasgow

## 2021-10-23 ENCOUNTER — Ambulatory Visit (INDEPENDENT_AMBULATORY_CARE_PROVIDER_SITE_OTHER): Payer: Commercial Managed Care - HMO

## 2021-10-23 DIAGNOSIS — E538 Deficiency of other specified B group vitamins: Secondary | ICD-10-CM | POA: Diagnosis not present

## 2021-10-23 MED ORDER — CYANOCOBALAMIN 1000 MCG/ML IJ SOLN
1000.0000 ug | Freq: Once | INTRAMUSCULAR | Status: AC
  Administered 2021-10-23: 1000 ug via INTRAMUSCULAR

## 2021-10-24 NOTE — Progress Notes (Signed)
Pt is here for a b12 injection today.    Date of last office visit that b12 was discussed 06/19/2021  Last injection was 08/06/2021. Precepted with Dr. Andria Frames as it has been several months since last injection. He gave verbal okay to administer injection today.   Injection given in RD deltoid. Scheduled patient for annual exam and follow up on 11/21/21.  Talbot Grumbling, RN

## 2021-11-21 ENCOUNTER — Ambulatory Visit: Payer: No Typology Code available for payment source | Admitting: Family Medicine

## 2021-12-02 ENCOUNTER — Encounter: Payer: Self-pay | Admitting: Family Medicine

## 2021-12-02 ENCOUNTER — Ambulatory Visit (INDEPENDENT_AMBULATORY_CARE_PROVIDER_SITE_OTHER): Payer: Commercial Managed Care - HMO | Admitting: Family Medicine

## 2021-12-02 VITALS — BP 126/68 | HR 58 | Ht 66.0 in | Wt 189.8 lb

## 2021-12-02 DIAGNOSIS — K219 Gastro-esophageal reflux disease without esophagitis: Secondary | ICD-10-CM

## 2021-12-02 DIAGNOSIS — D500 Iron deficiency anemia secondary to blood loss (chronic): Secondary | ICD-10-CM

## 2021-12-02 DIAGNOSIS — Z13220 Encounter for screening for lipoid disorders: Secondary | ICD-10-CM | POA: Diagnosis not present

## 2021-12-02 DIAGNOSIS — Z833 Family history of diabetes mellitus: Secondary | ICD-10-CM | POA: Insufficient documentation

## 2021-12-02 DIAGNOSIS — I10 Essential (primary) hypertension: Secondary | ICD-10-CM

## 2021-12-02 DIAGNOSIS — E538 Deficiency of other specified B group vitamins: Secondary | ICD-10-CM

## 2021-12-02 DIAGNOSIS — R87618 Other abnormal cytological findings on specimens from cervix uteri: Secondary | ICD-10-CM

## 2021-12-02 DIAGNOSIS — Z111 Encounter for screening for respiratory tuberculosis: Secondary | ICD-10-CM

## 2021-12-02 MED ORDER — CYANOCOBALAMIN 1000 MCG/ML IJ SOLN
1000.0000 ug | Freq: Once | INTRAMUSCULAR | Status: AC
  Administered 2021-12-02: 1000 ug via INTRAMUSCULAR

## 2021-12-02 NOTE — Patient Instructions (Addendum)
You need a pap smear soon because your last pap was not perfectly normal.  Please make an appointment. Next time you get a cut or scrape, please get a tetanus booster.   I suggest the shingles vaccine at your convenience.  Please don't put it off too long I will call tomorrow with the blood test results.   It has been a real privilege taking care of you and your family all these years. I am delighted that you are in such a good place right now.

## 2021-12-03 ENCOUNTER — Encounter: Payer: Self-pay | Admitting: Family Medicine

## 2021-12-03 ENCOUNTER — Other Ambulatory Visit: Payer: No Typology Code available for payment source

## 2021-12-03 DIAGNOSIS — Z111 Encounter for screening for respiratory tuberculosis: Secondary | ICD-10-CM

## 2021-12-03 LAB — LIPID PANEL
Chol/HDL Ratio: 3.5 ratio (ref 0.0–4.4)
Cholesterol, Total: 146 mg/dL (ref 100–199)
HDL: 42 mg/dL (ref >39)
LDL Chol Calc (NIH): 90 mg/dL (ref 0–99)
Triglycerides: 70 mg/dL (ref 0–149)
VLDL Cholesterol Cal: 14 mg/dL (ref 5–40)

## 2021-12-03 LAB — CMP14+EGFR
ALT: 15 IU/L (ref 0–32)
AST: 19 IU/L (ref 0–40)
Albumin/Globulin Ratio: 1.9 (ref 1.2–2.2)
Albumin: 4.3 g/dL (ref 3.9–4.9)
Alkaline Phosphatase: 77 IU/L (ref 44–121)
BUN/Creatinine Ratio: 13 (ref 12–28)
BUN: 9 mg/dL (ref 8–27)
Bilirubin Total: 0.4 mg/dL (ref 0.0–1.2)
CO2: 24 mmol/L (ref 20–29)
Calcium: 10 mg/dL (ref 8.7–10.3)
Chloride: 105 mmol/L (ref 96–106)
Creatinine, Ser: 0.71 mg/dL (ref 0.57–1.00)
Globulin, Total: 2.3 g/dL (ref 1.5–4.5)
Glucose: 93 mg/dL (ref 70–99)
Potassium: 4.6 mmol/L (ref 3.5–5.2)
Sodium: 144 mmol/L (ref 134–144)
Total Protein: 6.6 g/dL (ref 6.0–8.5)
eGFR: 97 mL/min/{1.73_m2} (ref >59)

## 2021-12-03 LAB — TSH: TSH: 1.6 u[IU]/mL (ref 0.450–4.500)

## 2021-12-03 LAB — HEMOGLOBIN A1C
Est. average glucose Bld gHb Est-mCnc: 120 mg/dL
Hgb A1c MFr Bld: 5.8 % — ABNORMAL HIGH (ref 4.8–5.6)

## 2021-12-03 LAB — CBC
Hematocrit: 37.2 % (ref 34.0–46.6)
Hemoglobin: 12.4 g/dL (ref 11.1–15.9)
MCH: 29.8 pg (ref 26.6–33.0)
MCHC: 33.3 g/dL (ref 31.5–35.7)
MCV: 89 fL (ref 79–97)
Platelets: 280 10*3/uL (ref 150–450)
RBC: 4.16 x10E6/uL (ref 3.77–5.28)
RDW: 14.1 % (ref 11.7–15.4)
WBC: 4.5 10*3/uL (ref 3.4–10.8)

## 2021-12-03 LAB — FERRITIN: Ferritin: 24 ng/mL (ref 15–150)

## 2021-12-03 NOTE — Assessment & Plan Note (Signed)
Recheck CBC. 

## 2021-12-03 NOTE — Assessment & Plan Note (Signed)
Diabetes screen came back in prediabetic range for the first time.  Will not label as prediabetic since it takes two.  Called and recommended focus on diet and exercise.

## 2021-12-03 NOTE — Assessment & Plan Note (Signed)
Doing quite well on amlodipine 5.  No change.

## 2021-12-03 NOTE — Assessment & Plan Note (Signed)
Check labs 

## 2021-12-03 NOTE — Assessment & Plan Note (Signed)
She will schedule visit soon due to last pap.

## 2021-12-03 NOTE — Assessment & Plan Note (Signed)
Check lipid panel  

## 2021-12-03 NOTE — Progress Notes (Signed)
    SUBJECTIVE:   CHIEF COMPLAINT / HPI:   Multiple problems: Hx of iron deficiency anemia and also B12 deficiency.  Receiving B12 shots monthly.  Due for lab chck.   Hypertension on amlodipine 5 without side effects.  Denies CP and DOE.   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  Prefers blood test. Due for cholesterol screen.  Primary prevention. Fhx of DM.  Would like screened.   Will get flu shot.  Declines COVID booster and shingles vaccine at this time.  I recommended both.   Due for pap - does not want today.  Reminded of mildly abnormal pap on last visit.   Arbadella has entered a nice, smooth phase of her life.  She has low levels of manageable stress, enjoys her grandchildren and still enjoys good health.  She intends to exercise more and lose a few pounds.  She seems to be living her best life.    OBJECTIVE:   BP 126/68   Pulse (!) 58   Ht '5\' 6"'$  (1.676 m)   Wt 189 lb 12.8 oz (86.1 kg)   SpO2 99%   BMI 30.63 kg/m   Lungs clear Cardiac RRR without m or g  ASSESSMENT/PLAN:   Family history of diabetes mellitus Diabetes screen came back in prediabetic range for the first time.  Will not label as prediabetic since it takes two.  Called and recommended focus on diet and exercise.  Hypertension Doing quite well on amlodipine 5.  No change.  Abnormal Pap smear of cervix She will schedule visit soon due to last pap.  Vitamin B 12 deficiency Recheck CBC  Screening cholesterol level Check lipid panel.    Screening-pulmonary TB Check labs     [name], [place]

## 2021-12-06 LAB — QUANTIFERON-TB GOLD PLUS
QuantiFERON Mitogen Value: 4.8 IU/mL
QuantiFERON Nil Value: 0.03 IU/mL
QuantiFERON TB1 Ag Value: 0.03 IU/mL
QuantiFERON TB2 Ag Value: 0.03 IU/mL
QuantiFERON-TB Gold Plus: NEGATIVE

## 2021-12-09 ENCOUNTER — Telehealth: Payer: Self-pay | Admitting: Family Medicine

## 2021-12-09 ENCOUNTER — Encounter: Payer: Self-pay | Admitting: Family Medicine

## 2021-12-09 NOTE — Telephone Encounter (Signed)
Patient dropped off medical evaluation form to be completed. Last DOS was 12/02/21. Placed in [employer]

## 2021-12-10 NOTE — Telephone Encounter (Signed)
Dr Andria Frames has taken care of this form. Ottis Stain, CMA

## 2021-12-31 ENCOUNTER — Telehealth: Payer: Self-pay

## 2021-12-31 NOTE — Telephone Encounter (Signed)
Patient calls nurse line requesting to schedule appointment for B12 injection.   Spoke with Dr. Andria Frames regarding request. Advised that patient could receive injection as she has chronic B12 deficiency.   Per Dr. Andria Frames, patient should receive B12 injection monthly.   Patient scheduled in nurse clinic tomorrow morning.   Talbot Grumbling, RN

## 2022-01-01 ENCOUNTER — Ambulatory Visit (INDEPENDENT_AMBULATORY_CARE_PROVIDER_SITE_OTHER): Payer: Commercial Managed Care - HMO

## 2022-01-01 DIAGNOSIS — E538 Deficiency of other specified B group vitamins: Secondary | ICD-10-CM

## 2022-01-01 MED ORDER — CYANOCOBALAMIN 1000 MCG/ML IJ SOLN
1000.0000 ug | Freq: Once | INTRAMUSCULAR | Status: AC
  Administered 2022-01-01: 1000 ug via INTRAMUSCULAR

## 2022-01-01 NOTE — Progress Notes (Signed)
Pt is here for monthly b12 injection today.    Date of last office visit that b12 was discussed 12/02/2021.  Last injection was 12/02/2021.  Injection given in LD deltoid, pt tolerated well and will stop at front desk to schedule next appt.

## 2022-01-27 ENCOUNTER — Other Ambulatory Visit: Payer: Self-pay | Admitting: Family Medicine

## 2022-03-03 ENCOUNTER — Ambulatory Visit: Payer: Self-pay

## 2022-03-03 ENCOUNTER — Ambulatory Visit: Payer: Commercial Managed Care - HMO

## 2022-03-03 DIAGNOSIS — E538 Deficiency of other specified B group vitamins: Secondary | ICD-10-CM

## 2022-03-04 ENCOUNTER — Telehealth: Payer: Self-pay

## 2022-03-04 MED ORDER — CYANOCOBALAMIN 1000 MCG/ML IJ SOLN
1000.0000 ug | Freq: Once | INTRAMUSCULAR | Status: AC
  Administered 2022-03-03: 1000 ug via INTRAMUSCULAR

## 2022-03-04 NOTE — Progress Notes (Signed)
Pt is here for a b12 injection today.    Date of last office visit that b12 was discussed 12/02/2021  Last injection was 01/01/2022. Precepted with Dr. Nori Riis. Received verbal okay to proceed with monthly injection.   Injection given in RD deltoid, pt tolerated well. Scheduled PCP follow up on 03/20/22.   Talbot Grumbling, RN

## 2022-03-04 NOTE — Telephone Encounter (Signed)
Patient in nurse clinic yesterday for b12 injection. Patient's last dose was 01/01/22. Patient reports increased fatigue. She is asking if there are any other OTC medications/supplements that would help her with this.   Advised patient to follow up for monthly b12 injections and that I would forward message to provider regarding OTC recommendations.   She also asked to schedule follow up appointment to discuss trying to come off of BP medication.   I have scheduled her on 03/20/22.  Please advise.   Talbot Grumbling, RN

## 2022-03-04 NOTE — Telephone Encounter (Signed)
Called and discussed.  She is feeling some better.  Had recent (Oct) exam and blood work - all reassuring.  No focal symptoms which might suggest an alternative etiology.  Discussed that she might cancel the appointment on 2/8 if her symptoms resolve.

## 2022-03-18 ENCOUNTER — Encounter: Payer: Self-pay | Admitting: Family Medicine

## 2022-03-20 ENCOUNTER — Ambulatory Visit: Payer: No Typology Code available for payment source | Admitting: Family Medicine

## 2022-03-27 ENCOUNTER — Ambulatory Visit: Payer: No Typology Code available for payment source | Admitting: Family Medicine

## 2022-04-17 ENCOUNTER — Observation Stay (HOSPITAL_COMMUNITY): Payer: Commercial Managed Care - HMO

## 2022-04-17 ENCOUNTER — Observation Stay (HOSPITAL_COMMUNITY)
Admission: EM | Admit: 2022-04-17 | Discharge: 2022-04-18 | Disposition: A | Discharge status: Home / Self Care | Payer: Commercial Managed Care - HMO | Attending: Family Medicine | Admitting: Family Medicine

## 2022-04-17 ENCOUNTER — Encounter (HOSPITAL_COMMUNITY): Payer: Self-pay

## 2022-04-17 ENCOUNTER — Other Ambulatory Visit: Payer: Self-pay

## 2022-04-17 ENCOUNTER — Telehealth: Payer: Self-pay

## 2022-04-17 ENCOUNTER — Emergency Department (HOSPITAL_COMMUNITY): Payer: Commercial Managed Care - HMO

## 2022-04-17 DIAGNOSIS — E041 Nontoxic single thyroid nodule: Secondary | ICD-10-CM | POA: Diagnosis not present

## 2022-04-17 DIAGNOSIS — R471 Dysarthria and anarthria: Secondary | ICD-10-CM | POA: Diagnosis not present

## 2022-04-17 DIAGNOSIS — J45909 Unspecified asthma, uncomplicated: Secondary | ICD-10-CM | POA: Diagnosis not present

## 2022-04-17 DIAGNOSIS — R2990 Unspecified symptoms and signs involving the nervous system: Secondary | ICD-10-CM

## 2022-04-17 DIAGNOSIS — R609 Edema, unspecified: Secondary | ICD-10-CM | POA: Diagnosis not present

## 2022-04-17 DIAGNOSIS — Z91040 Latex allergy status: Secondary | ICD-10-CM | POA: Insufficient documentation

## 2022-04-17 DIAGNOSIS — I6389 Other cerebral infarction: Secondary | ICD-10-CM | POA: Diagnosis not present

## 2022-04-17 DIAGNOSIS — I10 Essential (primary) hypertension: Secondary | ICD-10-CM | POA: Diagnosis present

## 2022-04-17 DIAGNOSIS — I639 Cerebral infarction, unspecified: Secondary | ICD-10-CM | POA: Diagnosis not present

## 2022-04-17 DIAGNOSIS — R4701 Aphasia: Secondary | ICD-10-CM | POA: Diagnosis present

## 2022-04-17 DIAGNOSIS — Z79899 Other long term (current) drug therapy: Secondary | ICD-10-CM | POA: Diagnosis not present

## 2022-04-17 DIAGNOSIS — Z87891 Personal history of nicotine dependence: Secondary | ICD-10-CM | POA: Insufficient documentation

## 2022-04-17 DIAGNOSIS — R7303 Prediabetes: Secondary | ICD-10-CM | POA: Insufficient documentation

## 2022-04-17 LAB — I-STAT CHEM 8, ED
BUN: 11 mg/dL (ref 8–23)
Calcium, Ion: 1.28 mmol/L (ref 1.15–1.40)
Chloride: 105 mmol/L (ref 98–111)
Creatinine, Ser: 0.8 mg/dL (ref 0.44–1.00)
Glucose, Bld: 99 mg/dL (ref 70–99)
HCT: 39 % (ref 36.0–46.0)
Hemoglobin: 13.3 g/dL (ref 12.0–15.0)
Potassium: 3.5 mmol/L (ref 3.5–5.1)
Sodium: 143 mmol/L (ref 135–145)
TCO2: 29 mmol/L (ref 22–32)

## 2022-04-17 LAB — CBC
HCT: 38.4 % (ref 36.0–46.0)
Hemoglobin: 13.1 g/dL (ref 12.0–15.0)
MCH: 30.4 pg (ref 26.0–34.0)
MCHC: 34.1 g/dL (ref 30.0–36.0)
MCV: 89.1 fL (ref 80.0–100.0)
Platelets: 279 10*3/uL (ref 150–400)
RBC: 4.31 MIL/uL (ref 3.87–5.11)
RDW: 13.9 % (ref 11.5–15.5)
WBC: 5.2 10*3/uL (ref 4.0–10.5)
nRBC: 0 % (ref 0.0–0.2)

## 2022-04-17 LAB — COMPREHENSIVE METABOLIC PANEL
ALT: 20 U/L (ref 0–44)
AST: 23 U/L (ref 15–41)
Albumin: 3.8 g/dL (ref 3.5–5.0)
Alkaline Phosphatase: 73 U/L (ref 38–126)
Anion gap: 10 (ref 5–15)
BUN: 9 mg/dL (ref 8–23)
CO2: 25 mmol/L (ref 22–32)
Calcium: 9.7 mg/dL (ref 8.9–10.3)
Chloride: 106 mmol/L (ref 98–111)
Creatinine, Ser: 0.83 mg/dL (ref 0.44–1.00)
GFR, Estimated: >60 mL/min (ref >60)
Glucose, Bld: 106 mg/dL — ABNORMAL HIGH (ref 70–99)
Potassium: 3.5 mmol/L (ref 3.5–5.1)
Sodium: 141 mmol/L (ref 135–145)
Total Bilirubin: 0.7 mg/dL (ref 0.3–1.2)
Total Protein: 7.8 g/dL (ref 6.5–8.1)

## 2022-04-17 MED ORDER — PROCHLORPERAZINE EDISYLATE 10 MG/2ML IJ SOLN: 10.0000 mg | Freq: Once | INTRAMUSCULAR | Status: AC

## 2022-04-17 MED ORDER — PROCHLORPERAZINE EDISYLATE 10 MG/2ML IJ SOLN
INTRAMUSCULAR | Status: AC
  Administered 2022-04-17: 10 mg via INTRAVENOUS
  Filled 2022-04-17: qty 2

## 2022-04-17 MED ORDER — IOHEXOL 350 MG/ML SOLN
75.0000 mL | Freq: Once | INTRAVENOUS | Status: AC | PRN
  Administered 2022-04-17: 75 mL via INTRAVENOUS

## 2022-04-17 MED ORDER — LORAZEPAM 2 MG/ML IJ SOLN: 2.0000 mg | Freq: Once | INTRAMUSCULAR | Status: DC

## 2022-04-17 MED ORDER — ENOXAPARIN SODIUM 40 MG/0.4ML IJ SOSY
40.0000 mg | PREFILLED_SYRINGE | INTRAMUSCULAR | Status: DC
  Administered 2022-04-17 – 2022-04-18 (×2): 40 mg via SUBCUTANEOUS
  Filled 2022-04-17 (×2): qty 0.4

## 2022-04-17 MED ORDER — CLOPIDOGREL BISULFATE 75 MG PO TABS
75.0000 mg | ORAL_TABLET | Freq: Every day | ORAL | Status: DC
  Administered 2022-04-17 – 2022-04-18 (×2): 75 mg via ORAL
  Filled 2022-04-17 (×2): qty 1

## 2022-04-17 MED ORDER — STROKE: EARLY STAGES OF RECOVERY BOOK
Freq: Once | Status: AC
  Administered 2022-04-18: 1
  Filled 2022-04-17: qty 1

## 2022-04-17 MED ORDER — ASPIRIN 81 MG PO TBEC
81.0000 mg | DELAYED_RELEASE_TABLET | Freq: Every day | ORAL | Status: DC
  Administered 2022-04-17 – 2022-04-18 (×2): 81 mg via ORAL
  Filled 2022-04-17 (×2): qty 1

## 2022-04-17 MED ORDER — LORAZEPAM 2 MG/ML IJ SOLN
INTRAMUSCULAR | Status: AC
  Filled 2022-04-17: qty 1

## 2022-04-17 MED ORDER — LORATADINE 10 MG PO TABS
10.0000 mg | ORAL_TABLET | Freq: Every day | ORAL | Status: DC
  Administered 2022-04-17 – 2022-04-18 (×2): 10 mg via ORAL
  Filled 2022-04-17 (×2): qty 1

## 2022-04-17 NOTE — Assessment & Plan Note (Addendum)
Incidental finding of 1.5 cm left thyroid nodule found on CTA head/neck. Non-tender thyroid on exam. -F/u thyroid US outpatient

## 2022-04-17 NOTE — Assessment & Plan Note (Addendum)
Glu 106 on AM labs, well controlled. -F/u A1c

## 2022-04-17 NOTE — Telephone Encounter (Signed)
Patient calls office requesting to schedule appointment for B12 injection. She spoke with scheduling team, however, it was brought to my attention that patient was having slurred speech and complained of "feeling like mouth is twisted."  Symptoms started shortly after she woke up. Unsure of exact time.   Spoke with patient directly regarding concern. While speaking with patient, speech was slurred.   Advised that patient be evaluated immediately in the ED for stroke like symptoms. Offered to call ambulance for patient multiple times, patient states that she will obtain ride to the hospital and to not call 911. Patient mentioned that she felt fine to drive. Strongly advised that patient do not drive due to these symptoms. She states that she is going to have friend transport her to the hospital.   Advised that she needs to be seen as soon as possible. Preferably within an hour of symptoms starting.   She verbalized understanding.   Talbot Grumbling, RN

## 2022-04-17 NOTE — ED Notes (Signed)
Dr Ashok Cordia made aware of pt

## 2022-04-17 NOTE — Progress Notes (Signed)
Pt admitted to 3W13.  Alert & oriented x 4. Denies pain. Slight slurred speech noted and pt admits to a change in speech. Tele placed on patient, CCMD called and second verification completed.  Bed alarm set, call bell within reach and patient verbalizes understanding to call before attempting to get out of bed.  Family at bedside.

## 2022-04-17 NOTE — Progress Notes (Signed)
Arrived at room for EEG.  Patient still having MRI.   Will attempt at a later time

## 2022-04-17 NOTE — Progress Notes (Signed)
PT Cancellation Note  Patient Details Name: Meghan [name] MRN: [id number] DOB: October 13, 1960   Cancelled Treatment:    Reason Eval/Treat Not Completed: Patient at procedure or test/unavailable   [place]  Office [phone]   Rexanne Mano 04/17/2022, 4:07 PM

## 2022-04-17 NOTE — Consult Note (Addendum)
NEUROLOGY CONSULTATION NOTE   Date of service: April 17, 2022 Patient Name: Meghan [name] MRN:  [id number] DOB:  1960-06-09 Reason for consult: "code stroke" Requesting Provider: Lajean Saver, MD   History of Present Illness  Meghan [name] is a 62 y.o. female with a PMH significant for asthma, malignant hyperthermia post anesthesia, back pain and migraines who presents for evaluation of new complaints of slurred speech and "feeling like my mouth is twisted".  Patient drove herself into the ED after calling her primary care doctor to request an appointment for B12 injection; on the phone with the nurse she complained of having slurred speech and feeling like her mouth was twisted.  RN and physician instructed patient to come straight to the ED, where a Code Stroke was then called.   Patient states that when she got to the ED she did not feel like her mouth was twisted anymore and that her headache had mostly resolved. She states that when the nurse came into the room to do her assessment she started having a headache again and she also had new onset of tingling to the right temple area.  BP was in the 160s,  with CBG WNL. Upon assessment in ED/CT scan patient states headache is a 3/10. She no longer exhibits slurred speech, and no facial asymmetry or mouth twisting are seen on exam.  Denies any recent illnesses or vaccinations.  LKW: last night TNK: no, outside of window/symptoms resolved IR: no, symptoms resolved/no LVO suspected mRs: 0 NIHSS: 0   ROS   Constitutional Denies weight loss, fever and chills.   HEENT Denies changes in vision and hearing.   Respiratory Denies SOB and cough.   CV Denies palpitations and CP   GI Denies abdominal pain, nausea, vomiting and diarrhea.   GU Denies dysuria and urinary frequency.   MSK Denies myalgia and joint pain.   Skin Denies rash and pruritus.   Neurological  + for 3/10 headache, denies syncope.   Psychiatric Denies recent changes in  mood. Denies anxiety and depression.    Past History   Past Medical History:  Diagnosis Date   Angio-edema    Asthma    Back pain [id number]   Lichen planus    Malignant hyperthermia    1988, 1987   Unspecified sinusitis (chronic) 08/12/2012   Urticaria    Past Surgical History:  Procedure Laterality Date   ADENOIDECTOMY     COLONOSCOPY WITH PROPOFOL N/A 08/02/2021   Procedure: COLONOSCOPY WITH PROPOFOL;  Surgeon: Carol Ada, MD;  Location: [facility];  Service: Gastroenterology;  Laterality: N/A;   ESOPHAGOGASTRODUODENOSCOPY (EGD) WITH PROPOFOL N/A 02/25/2019   Procedure: ESOPHAGOGASTRODUODENOSCOPY (EGD) WITH PROPOFOL;  Surgeon: Carol Ada, MD;  Location: [facility];  Service: Endoscopy;  Laterality: N/A;   ESOPHAGOGASTRODUODENOSCOPY (EGD) WITH PROPOFOL N/A 08/02/2021   Procedure: ESOPHAGOGASTRODUODENOSCOPY (EGD) WITH PROPOFOL;  Surgeon: Carol Ada, MD;  Location: [facility];  Service: Gastroenterology;  Laterality: N/A;   HEMOSTASIS CLIP PLACEMENT  02/25/2019   Procedure: HEMOSTASIS CLIP PLACEMENT;  Surgeon: Carol Ada, MD;  Location: [facility];  Service: Endoscopy;;   HEMOSTASIS CLIP PLACEMENT  08/02/2021   Procedure: HEMOSTASIS CLIP PLACEMENT;  Surgeon: Carol Ada, MD;  Location: [facility];  Service: Gastroenterology;;   POLYPECTOMY  02/25/2019   Procedure: POLYPECTOMY;  Surgeon: Carol Ada, MD;  Location: [facility];  Service: Endoscopy;;   POLYPECTOMY  08/02/2021   Procedure: POLYPECTOMY;  Surgeon: Carol Ada, MD;  Location: [facility];  Service: Gastroenterology;;  EGD and  COLON   TONSILLECTOMY  1987   TUBAL LIGATION  1988   UTERINE ARTERY EMBOLIZATION  2000   for fibroids    Family History  Problem Relation Age of Onset   Hypertension Mother    Kidney disease Mother    Cholecystitis Mother    Hypertension Father    Diabetes Father    Kidney disease Daughter    Social History   Socioeconomic History   Marital status: Divorced     Spouse name: Not on file   Number of children: Not on file   Years of education: Not on file   Highest education level: Not on file  Occupational History   Not on file  Tobacco Use   Smoking status: Former    Types: Cigarettes    Quit date: 09/13/1995    Years since quitting: 26.6   Smokeless tobacco: Never  Vaping Use   Vaping Use: Never used  Substance and Sexual Activity   Alcohol use: Yes    Alcohol/week: 0.5 standard drinks of alcohol    Types: 1 drink(s) per week   Drug use: No   Sexual activity: Not Currently  Other Topics Concern   Not on file  Social History Narrative   Not on file   Social Determinants of Health   Financial Resource Strain: Not on file  Food Insecurity: Not on file  Transportation Needs: Not on file  Physical Activity: Not on file  Stress: Not on file  Social Connections: Not on file   Allergies  Allergen Reactions   Epinephrine Other (See Comments)    Shuts body down   Shellfish Allergy Itching and Swelling   Codeine Hives and Itching   Latex Swelling and Hives    Per allergist   Lidocaine Swelling    Disorientation, shocks system  OK to use mepivacaine     Metronidazole Hives and Itching   Morphine     Shuts system down    Other     malignant hyperthermia   Oxycodone     Shut system down    Oxycodone-Acetaminophen     Shuts system down   Sulfonamide Derivatives Hives and Itching    Home Medications    No current facility-administered medications on file prior to encounter.   Current Outpatient Medications on File Prior to Encounter  Medication Sig Dispense Refill   albuterol (VENTOLIN HFA) 108 (90 Base) MCG/ACT inhaler INHALE 2 PUFFS INTO LUNGS EVERY 6 HOURS AS NEEDED FOR WHEEZING FOR SHORTNESS OF BREATH 18 g 0   amLODipine (NORVASC) 5 MG tablet Take 1 tablet (5 mg total) by mouth at bedtime. 90 tablet 3   cetirizine (ZYRTEC) 10 MG tablet Take 10 mg by mouth daily as needed for allergies.     Cyanocobalamin (B-12 COMPLIANCE  INJECTION) 1000 MCG/ML KIT Inject 1,000 mcg as directed every 30 (thirty) days.     cyclobenzaprine (FLEXERIL) 10 MG tablet Take 1 tablet (10 mg total) by mouth 3 (three) times daily as needed for muscle spasms. 30 tablet 0   EPINEPHrine 0.3 mg/0.3 mL IJ SOAJ injection Inject 0.3 mg into the muscle as needed for anaphylaxis. 2 each 1   ferrous sulfate 325 (65 FE) MG tablet Take 1 tablet (325 mg total) by mouth every other day.     Multiple Vitamin (MULTIVITAMIN WITH MINERALS) TABS tablet Take 1 tablet by mouth daily.       Vitals   Vitals:   04/17/22 1026 04/17/22 1027  BP: (!) 150/99  Pulse: 68   Resp: 16   Temp: 98.3 F (36.8 C)   TempSrc: Oral   SpO2: 100%   Weight:  81.6 kg  Height:  '5\' 6"'$  (1.676 m)     Body mass index is 29.05 kg/m.  Physical Exam   General: Laying comfortably in bed; in no acute distress.  HENT: Normal oropharynx and mucosa. Normal external appearance of ears and nose.  Neck: Supple, no pain or tenderness  CV: No JVD. No peripheral edema.  Pulmonary: Symmetric Chest rise. Normal respiratory effort.  Abdomen: Soft to touch, non-tender.  Ext: No cyanosis, edema, or deformity  Skin: No rash. Normal palpation of skin.   Musculoskeletal: Normal digits and nails by inspection.  Neurologic Examination  Mental status/Cognition: Alert, oriented to self, place, month and year, good attention.  Speech/language: Fluent, comprehension intact, object naming intact, repetition intact. Cranial nerves:   CN II Pupils equal and reactive to light, no VF deficits    CN III,IV,VI EOM intact, no gaze preference or deviation, no nystagmus   CN V normal sensation in V1, V2, and V3 segments bilaterally    CN VII no asymmetry, no nasolabial fold flattening    CN VIII normal hearing to speech    CN IX & X normal palatal elevation, no uvular deviation    CN XI 5/5 head turn and 5/5 shoulder shrug bilaterally    CN XII midline tongue protrusion   Motor:  5/5, no drift in  all extremities. Normal bulk and tone. Sensation: Intact and symmetric to light touch and temperature  Coordination/Complex Motor:  - Finger to Nose: intact - Heel to shin: intact  Gait: Deferred  Labs   CBC:  Recent Labs  Lab 04/17/22 1031  WBC 5.2  HGB 13.1  HCT 38.4  MCV 89.1  PLT 123XX123    Basic Metabolic Panel:  Lab Results  Component Value Date   NA 144 12/02/2021   K 4.6 12/02/2021   CO2 24 12/02/2021   GLUCOSE 93 12/02/2021   BUN 9 12/02/2021   CREATININE 0.71 12/02/2021   CALCIUM 10.0 12/02/2021   GFRNONAA 88 11/07/2019   GFRAA 101 11/07/2019   Lipid Panel:  Lab Results  Component Value Date   [place] 90 12/02/2021   HgbA1c:  Lab Results  Component Value Date   HGBA1C 5.8 (H) 12/02/2021   Urine Drug Screen: No results found for: "LABOPIA", "COCAINSCRNUR", "LABBENZ", "AMPHETMU", "THCU", "LABBARB"  Alcohol Level No results found for: "ETH"  CT Head without contrast(Personally reviewed): 1. No evidence of acute intracranial abnormality. 2. ASPECTS is 10.   MRI Brain (Personally reviewed): Small acute infarcts in the high left posterior frontal lobe.     Impression  Meghan [name] is a 62 y.o. female with PMH significant for asthma, back pain, malignant hyperthermia postanesthesia.  Patient also states that she has history of migraines.  Her neurologic examination is notable for no focal deficits after coming in as a code stroke due to complaints of slurred speech, questionable twisted mouth and increased headache in ED with right facial tingling. BP was in the 160s,  with CBG WNL. - NIHSS: 0 on assessment,  - CT negative. - MRI Brain (Personally reviewed): Small acute infarcts in the high left posterior frontal lobe.  - Stroke risk factors: None. - MRI positive for acute infarct. Imaging shows abnormal DWI signal that is cortically based without underlying white matter abnormality, involving a single sulcus of the posterior left frontal lobe.    -  DDx:  - Atypical imaging presentation of acute ischemic stroke - Atypical transient restricted cortical diffusion associated with complicated migraine - Subclinical left motor cortex focal seizure activity with associated focal cortical edema. - Per literature search regarding DDx of migraine, acute migraine with aura can be associated with several unique neuroimaging findings including reversible cortical diffusion restriction, cortical venous engorgement, and a "biphasic" transition from hypoperfusion to hyperperfusion. Imaging findings during migraine with aura tend to span more than one vascular territory.  Recommendations   - Admit for stroke work-up - Compazine given for headache. Patient required Ativan due to claustrophobia. - rEEG - Frequent Neuro checks/NIH per stroke unit protocol - Vascular imaging - CTA - TTE w/bubble - Lipid panel - Statin - will be started if LDL>70 or otherwise medically indicated - A1C - Antithrombotic - ASA '81mg'$  daily + plavix '75mg'$  daily x21 days f/b ASA '81mg'$  daily monotherapy after that  - DVT ppx - lovenox - Smoking cessation - will counsel patient - SBP goal - <220, PRN labetalol if HR>60 and PRN Hydralazine if HR<60. Hold home Norvasc. - Telemetry monitoring for arrhythmia - 72h - Swallow screen - will be performed prior to PO intake - Stroke education - will be given - PT/OT/SLP  Stroke team will follow in AM.   ______________________________________________________________________   I have seen and examined the patient. I have formulated the assessment and recommendations. 62 year old female presenting with acute onset of right facial droop, right facial tingling and left temporal and frontal headache with photophobia. MRI reveals unusual appearance of focal DWI signal abnormality involving the cortex of one sulcus only, with no underlying white matter involvement. The lesion is in the left posterior frontal lobe, corresponding to her  contralateral symptoms. DDx includes atypical imaging presentation of acute ischemic stroke, atypical transient restricted cortical diffusion associated with complicated migraine and subclinical left motor cortex focal seizure activity with associated focal cortical edema. Recommendations include EEG and stroke work up.  Electronically signed: Dr. Kerney Elbe

## 2022-04-17 NOTE — Progress Notes (Addendum)
FMTS Brief Progress Note  S: Patient seen with Dr. Joelyn Oms. Reporting mild headache and stuffiness. Otherwise denying any other complaints. Reports feeling improved speech. Updated patient on MRI findings and reported that stroke team will follow-up in the AM. Daughter present via facetime.   O: BP (!) 159/86 (BP Location: Right Arm)   Pulse 75   Temp 97.8 F (36.6 C) (Oral)   Resp 16   Ht '5\' 6"'$  (1.676 m)   Wt 81.6 kg   SpO2 96%   BMI 29.05 kg/m    GEN: alert, well-appearing, in no acute distress.  CARDIO: regular rate and rhythm. No m/r/g. RESP: CTAB. Normal WOB on RA.  NEURO: 5/5 strength in BUE and BLE. EOM intact. Slight R facial droop. Fluent speech, no aphasia or dysarthria noted.   A/P: L posterior frontal lobe stroke MRI, small acute infarct high L posterior frontal lobe. EEG unremarkable. CTA no LVO, 1.5cm L thyroid nodule.  -pending stroke team follow-up -ASA81+plavix75 x 21 days (3/7-3/28) -> ASA 81 monotherapy  -permissive HTN, SBP goal - <220, PRN labetalol if HR>60 and PRN Hydralazine if HR<60.  -echo pending -f/u lipid panel -f/u A1c   Essential hypertension BP mildly elevated. Compliant on Amlodipine at home. -Allow for permissive hypertension as above.  -Holding home Amlodipine  Headache, nasal congestion Reports mild headache and feeling congested. Suspect allergies.  -Ordered claritin (home med zyrtec)  Rest of plan per day team  - Orders reviewed. Labs for AM ordered, which was adjusted as needed.  - If condition changes, plan includes consult neurology, STAT CT head.   Rolanda Lundborg, MD 04/17/2022, 7:26 PM PGY-1, [facility] Night Resident  Please page [phone] with questions.

## 2022-04-17 NOTE — ED Notes (Addendum)
Pt ambulated to restroom without issue, UA held at bedside

## 2022-04-17 NOTE — ED Triage Notes (Signed)
Pt states she felt like her teeth were crooked around 0730 when she woke up and her speech was starting to slur around 0800. LKW 2330 last night before bed. Pt denies ha, dizziness, numbness and tingling. Pt alert and oriented.

## 2022-04-17 NOTE — Code Documentation (Signed)
Stroke Response Nurse Documentation Code Documentation  Meghan [name] is a 62 y.o. female arriving to [facility]  via [employer] on 3/7 with past medical hx of hypertension. On No antithrombotic. Code stroke was activated by ED.   Patient from home where she was LKW at 2330 last night and now complaining of headache, R sided facial droop, and R facial tinging, and slurred speech. Pt reports waking up this morning and noticing R facial droop, tingling to R face, slurred speech, and then when she arrived to the ED she developed a headache.   Stroke team at the bedside on patient arrival. Labs drawn and patient cleared for CT by Dr. Ashok Cordia. Patient to CT with team. NIHSS 0, see documentation for details and code stroke times. Patient with no neuro deficits on exam. The following imaging was completed:  CT Head and MRI. Patient is not a candidate for IV Thrombolytic due to out of window. Patient is not not a candidate for IR due to no LVO suspected per MD.   Care Plan: MRI, Q2 hr neuro checks.   Bedside handoff with ED RN Meghan.    Lilly Cove  Stroke Response RN

## 2022-04-17 NOTE — Assessment & Plan Note (Addendum)
Normotensive. -Continue to allow for permissive hypertension to 220/120 as needed -Hold home Amlodipine

## 2022-04-17 NOTE — Progress Notes (Signed)
EEG complete - results pending no skin breakdown

## 2022-04-17 NOTE — Assessment & Plan Note (Addendum)
Improving symptoms with only mild slurred speech. EEG normal. Repeat MRI confirmed stroke. TSH normal. LDL 89. Per neuro will place 30 day monitor given occult stroke. -Neuro consulted, appreciate recs -ASA '81mg'$  daily + plavix '75mg'$  daily x21 days f/b ASA '81mg'$  daily monotherapy after that  -Echo -SLP/PT/OT consulted -Atorvastatin '20mg'$  per Neuro -DVT US -Transcranial doppler pending

## 2022-04-17 NOTE — ED Provider Notes (Addendum)
[facility] Provider Note   CSN: [id number] Arrival date & time: 04/17/22  1015  An emergency department physician performed an initial assessment on this suspected stroke patient at 55.  History  Chief Complaint  Patient presents with   Aphasia    Meghan [name] is a 62 y.o. female.  Patient with c/o feeling as if speech was slurred and right facial weakness and dizziness. States had gotten up around 6 am today and hadnt noticed symptoms, but when went to brush teeth around 0730 felt as if mouth was twisted and then noted her speech sounded slurred. Denies hx same symptoms. Denies arm/leg numbness or weakness no problems w balance. No severe headaches. No change in vision. Denies hx same symptoms. No ear pain, tinnitus or hearing loss.   The history is provided by the patient and medical records.       Home Medications Prior to Admission medications   Medication Sig Start Date End Date Taking? Authorizing Provider  albuterol (VENTOLIN HFA) 108 (90 Base) MCG/ACT inhaler INHALE 2 PUFFS INTO LUNGS EVERY 6 HOURS AS NEEDED FOR WHEEZING FOR SHORTNESS OF BREATH 01/27/22   Lenoria Chime, MD  amLODipine (NORVASC) 5 MG tablet Take 1 tablet (5 mg total) by mouth at bedtime. 06/19/21   Sharion Settler, DO  cetirizine (ZYRTEC) 10 MG tablet Take 10 mg by mouth daily as needed for allergies.    [provider]  Cyanocobalamin (B-12 COMPLIANCE INJECTION) 1000 MCG/ML KIT Inject 1,000 mcg as directed every 30 (thirty) days.    [provider]  cyclobenzaprine (FLEXERIL) 10 MG tablet Take 1 tablet (10 mg total) by mouth 3 (three) times daily as needed for muscle spasms. 10/22/20   [name], MD  EPINEPHrine 0.3 mg/0.3 mL IJ SOAJ injection Inject 0.3 mg into the muscle as needed for anaphylaxis. 06/19/21   Sharion Settler, DO  ferrous sulfate 325 (65 FE) MG tablet Take 1 tablet (325 mg total) by mouth every other day.  11/20/20   [name], MD  Multiple Vitamin (MULTIVITAMIN WITH MINERALS) TABS tablet Take 1 tablet by mouth daily.    [provider]      Allergies    Epinephrine, Shellfish allergy, Codeine, Latex, Lidocaine, Metronidazole, Morphine, Other, Oxycodone, Oxycodone-acetaminophen, and Sulfonamide derivatives    Review of Systems   Review of Systems  Constitutional:  Negative for chills and fever.  HENT:  Negative for sore throat and trouble swallowing.   Eyes:  Negative for redness and visual disturbance.  Respiratory:  Negative for cough and shortness of breath.   Cardiovascular:  Negative for chest pain.  Gastrointestinal:  Negative for abdominal pain, nausea and vomiting.  Genitourinary:  Negative for dysuria and flank pain.  Musculoskeletal:  Negative for back pain and neck pain.  Skin:  Negative for rash.  Neurological:  Positive for facial asymmetry, speech difficulty and numbness.  Hematological:  Does not bruise/bleed easily.  Psychiatric/Behavioral:  Negative for confusion.     Physical Exam Updated Vital Signs BP (!) 148/74   Pulse 69   Temp 98.3 F (36.8 C) (Oral)   Resp 15   Ht 1.676 m ('5\' 6"'$ )   Wt 81.6 kg   SpO2 99%   BMI 29.05 kg/m  Physical Exam Vitals and nursing note reviewed.  Constitutional:      Appearance: Normal appearance. She is well-developed.  HENT:     Head: Atraumatic.     Right Ear: Tympanic membrane  and ear canal normal.     Nose: Nose normal.     Mouth/Throat:     Mouth: Mucous membranes are moist.     Comments: Tongue and pharynx not grossly swollen.  Eyes:     General: No scleral icterus.    Conjunctiva/sclera: Conjunctivae normal.     Pupils: Pupils are equal, round, and reactive to light.  Neck:     Vascular: No carotid bruit.     Trachea: No tracheal deviation.  Cardiovascular:     Rate and Rhythm: Normal rate and regular rhythm.     Pulses: Normal pulses.     Heart sounds: Normal heart sounds. No murmur heard.     No friction rub. No gallop.  Pulmonary:     Effort: Pulmonary effort is normal. No respiratory distress.     Breath sounds: Normal breath sounds.  Abdominal:     General: Bowel sounds are normal. There is no distension.     Palpations: Abdomen is soft.     Tenderness: There is no abdominal tenderness.  Genitourinary:    Comments: No cva tenderness.  Musculoskeletal:        General: No swelling or tenderness.     Cervical back: Normal range of motion and neck supple. No rigidity. No muscular tenderness.  Skin:    General: Skin is warm and dry.     Findings: No rash.  Neurological:     Mental Status: She is alert.     Comments: Alert, speech mildly dysarthric. No expressive aphasia. Right facial weakness, but does not appear to involve forehead. Motor intact bil extremities, stre 5/5. Sens grossly intact bil.   Psychiatric:        Mood and Affect: Mood normal.     ED Results / Procedures / Treatments   Labs (all labs ordered are listed, but only abnormal results are displayed) Results for orders placed or performed during the hospital encounter of 04/17/22  CBC  Result Value Ref Range   WBC 5.2 4.0 - 10.5 K/uL   RBC 4.31 3.87 - 5.11 MIL/uL   Hemoglobin 13.1 12.0 - 15.0 g/dL   HCT 38.4 36.0 - 46.0 %   MCV 89.1 80.0 - 100.0 fL   MCH 30.4 26.0 - 34.0 pg   MCHC 34.1 30.0 - 36.0 g/dL   RDW 13.9 11.5 - 15.5 %   Platelets 279 150 - 400 K/uL   nRBC 0.0 0.0 - 0.2 %  Comprehensive metabolic panel  Result Value Ref Range   Sodium 141 135 - 145 mmol/L   Potassium 3.5 3.5 - 5.1 mmol/L   Chloride 106 98 - 111 mmol/L   CO2 25 22 - 32 mmol/L   Glucose, Bld 106 (H) 70 - 99 mg/dL   BUN 9 8 - 23 mg/dL   Creatinine, Ser 0.83 0.44 - 1.00 mg/dL   Calcium 9.7 8.9 - 10.3 mg/dL   Total Protein 7.8 6.5 - 8.1 g/dL   Albumin 3.8 3.5 - 5.0 g/dL   AST 23 15 - 41 U/L   ALT 20 0 - 44 U/L   Alkaline Phosphatase 73 38 - 126 U/L   Total Bilirubin 0.7 0.3 - 1.2 mg/dL   GFR, Estimated >60 >60  mL/min   Anion gap 10 5 - 15  I-stat chem 8, ED  Result Value Ref Range   Sodium 143 135 - 145 mmol/L   Potassium 3.5 3.5 - 5.1 mmol/L   Chloride 105 98 - 111 mmol/L  BUN 11 8 - 23 mg/dL   Creatinine, Ser 0.80 0.44 - 1.00 mg/dL   Glucose, Bld 99 70 - 99 mg/dL   Calcium, Ion 1.28 1.15 - 1.40 mmol/L   TCO2 29 22 - 32 mmol/L   Hemoglobin 13.3 12.0 - 15.0 g/dL   HCT 39.0 36.0 - 46.0 %    EKG EKG Interpretation  Date/Time:  Thursday April 17 2022 10:27:18 EST Ventricular Rate:  70 PR Interval:  166 QRS Duration: 85 QT Interval:  370 QTC Calculation: 400 R Axis:   19 Text Interpretation: Sinus rhythm Nonspecific ST abnormality Baseline wander Confirmed by Lajean Saver [phone]) on 04/17/2022 10:50:44 AM  Radiology CT ANGIO HEAD NECK W WO CM  Result Date: 04/17/2022 CLINICAL DATA:  Stroke/TIA, determine embolic source EXAM: CT ANGIOGRAPHY HEAD AND NECK TECHNIQUE: Multidetector CT imaging of the head and neck was performed using the standard protocol during bolus administration of intravenous contrast. Multiplanar CT image reconstructions and MIPs were obtained to evaluate the vascular anatomy. Carotid stenosis measurements (when applicable) are obtained utilizing NASCET criteria, using the distal internal carotid diameter as the denominator. RADIATION DOSE REDUCTION: This exam was performed according to the departmental dose-optimization program which includes automated exposure control, adjustment of the mA and/or kV according to patient size and/or use of iterative reconstruction technique. CONTRAST:  2m OMNIPAQUE IOHEXOL 350 MG/ML SOLN COMPARISON:  CT head same day. FINDINGS: CTA NECK FINDINGS Aortic arch: Great vessel origins are patent. No significant stenosis. Right carotid system: No evidence of dissection, stenosis (50% or greater), or occlusion. Left carotid system: No evidence of dissection, stenosis (50% or greater), or occlusion. Vertebral arteries: Codominant. No evidence of  dissection, stenosis (50% or greater), or occlusion. Skeleton: No acute findings on limited assessment. Other neck: No acute findings on limited assessment. Approximate 1.5 cm left thyroid nodule. Upper chest: Visualized lung apices are clear. Review of the MIP images confirms the above findings CTA HEAD FINDINGS Anterior circulation: Bilateral intracranial ICAs, MCAs and ACAs are patent without proximal hemodynamically significant stenosis. Posterior circulation: Bilateral intradural vertebral arteries, basilar artery and bilateral posterior cerebral arteries are patent without proximal hemodynamically significant stenosis. Small incidental fenestration of the proximal basilar artery. Venous sinuses: As permitted by contrast timing, patent. Review of the MIP images confirms the above findings IMPRESSION: 1. No large vessel occlusion or proximal hemodynamically significant stenosis. 2. Approximately 1.5 cm left thyroid nodule. Recommend thyroid [place](ref: J Am Coll Radiol. 2015 Feb;12(2): 143-50). Electronically Signed   By: FMargaretha SheffieldM.D.   On: 04/17/2022 13:21   MR BRAIN WO CONTRAST  Result Date: 04/17/2022 CLINICAL DATA:  Neuro deficit, acute, stroke suspected EXAM: MRI HEAD WITHOUT CONTRAST TECHNIQUE: Multiplanar, multiecho pulse sequences of the brain and surrounding structures were obtained without intravenous contrast. COMPARISON:  CT head from the same day. FINDINGS: Brain: Small wispy area of restricted diffusion in the high left posterior lobe with correlate hypointense ADC, compatible with acute infarcts. No significant edema or mass effect. No evidence of acute hemorrhage, mass lesion, midline shift or hydrocephalus. Vascular: Major arterial flow voids are maintained skull base. Skull and upper cervical spine: Normal marrow signal. Sinuses/Orbits: Largely clear sinuses.  No acute orbital findings. Other: No mastoid effusions. IMPRESSION: Small acute infarcts in the high left posterior frontal  lobe. Electronically Signed   By: FMargaretha SheffieldM.D.   On: 04/17/2022 11:58   CT HEAD CODE STROKE WO CONTRAST  Result Date: 04/17/2022 CLINICAL DATA:  Code stroke.  Neuro deficit, acute,  stroke suspected EXAM: CT HEAD WITHOUT CONTRAST TECHNIQUE: Contiguous axial images were obtained from the base of the skull through the vertex without intravenous contrast. RADIATION DOSE REDUCTION: This exam was performed according to the departmental dose-optimization program which includes automated exposure control, adjustment of the mA and/or kV according to patient size and/or use of iterative reconstruction technique. COMPARISON:  None Available. FINDINGS: Brain: No evidence of acute large vascular territory infarction, hemorrhage, hydrocephalus, extra-axial collection or mass lesion/mass effect. Vascular: No hyperdense vessel. Skull: No acute fracture. Sinuses/Orbits: Clear sinuses.  No acute orbital findings. Other: No mastoid effusions. ASPECTS [facility] Stroke Program Early CT Score) total score (0-10 with 10 being normal): 10. IMPRESSION: 1. No evidence of acute intracranial abnormality. 2. ASPECTS is 10. Code stroke imaging results were communicated on 04/17/2022 at 11:13 am to provider Dr. Cheral Marker via secure text paging. Electronically Signed   By: Margaretha Sheffield M.D.   On: 04/17/2022 11:13    Procedures Procedures    Medications Ordered in ED Medications  LORazepam (ATIVAN) injection 2 mg ( Intravenous Not Given 04/17/22 1150)   stroke: early stages of recovery book (has no administration in time range)  prochlorperazine (COMPAZINE) injection 10 mg (10 mg Intravenous Given 04/17/22 1156)  iohexol (OMNIPAQUE) 350 MG/ML injection 75 mL (75 mLs Intravenous Contrast Given 04/17/22 1302)    ED Course/ Medical Decision Making/ A&P                             Medical Decision Making Problems Addressed: Acute CVA (cerebrovascular accident) [facility]): acute illness or injury with systemic symptoms that poses a  threat to life or bodily functions Dysarthria: acute illness or injury Essential hypertension: chronic illness or injury with exacerbation, progression, or side effects of treatment that poses a threat to life or bodily functions  Amount and/or Complexity of Data Reviewed External Data Reviewed: notes. Labs: ordered. Decision-making details documented in ED Course. Radiology: ordered and independent interpretation performed. Decision-making details documented in ED Course. ECG/medicine tests: ordered and independent interpretation performed. Decision-making details documented in ED Course. Discussion of management or test interpretation with external provider(s): Neurology, medicine  Risk Decision regarding hospitalization.   Iv ns. Continuous pulse ox and cardiac monitoring. Labs ordered/sent. Imaging ordered.   Differential diagnosis includes cva, Bells Palsy, etc. Dispo decision including potential need for admission considered - will get labs and imaging and reassess.   Reviewed nursing notes and prior charts for additional history. External reports reviewed.   Code stroke activated - not entirely clear whether last normal this AM around 6 AM, or before bed last evening.   Emergent neurology consult - they indicate not candidate for tpa, but admit medicine re cva and they will follow.   Cardiac monitor: sinus rhythm, rate 68.  Labs reviewed/interpreted by me - wbc and hgb normal. Chem normal.   Neurochecks, no change from initial eval.   CT reviewed/interpreted by me - no hem.  MRI reviewed/interpreted by me - +cva.   Family practice consulted for admission.  Recheck, earlier symptoms improved.     Final Clinical Impression(s) / ED Diagnoses Final diagnoses:  Acute CVA (cerebrovascular accident) [facility])  Essential hypertension  Dysarthria    Rx / DC Orders ED Discharge Orders     None          Lajean Saver, MD 04/17/22 1355

## 2022-04-17 NOTE — Hospital Course (Addendum)
Meghan [name] is a 62 y.o.female with a history of HTN, prediabetes, IDA and B12 deficiency who was admitted to the [facility] Medicine Teaching Service at [facility] for slurred speech. Her hospital course is detailed below:  Acute CVA Onset of slurred speech and R facial droop around 7AM on 3/7, presented as Code Stroke to ED. Evaluated by Neuro, not a TNK candidate. CT head negative. MRI showed small acute infarcts in the high left posterior frontal lobe. Given atypical presentation, repeat MRI confirmed CVAs and EEG negative. CTA negative for vessel stenosis. Started on ASA and Plavix x 3 weeks, then ASA monotherapy after. DVT US was negative and transcranial doppler  reflect possible PFO. LDL mildly elevated, started on Lipitor '10mg'$  per Neuro. At discharge patient was stable and discharged with loop recorder to assess possible cardiac causes with planned follow up with cardiology   Other chronic conditions were medically managed with home medications and formulary alternatives as necessary (HTN, prediabetes)  PCP Follow-up Recommendations:  Recommended thyroid US, with 1.5 cm left thyroid nodule noted on CTA head/neck Statin added during hospitalization Aspirin and plavix for 21 days, then just aspirin.  Ensure follow-up with cardiology

## 2022-04-17 NOTE — Procedures (Signed)
Patient Name: Meghan [name]  MRN: [id number]  Epilepsy Attending: Lora Havens  Referring Physician/Provider: Otelia Santee, NP  Date: 04/17/2022  Duration: 22.59 mins  Patient history: 62yo F with slurred speech, questionable twisted mouth and increased headache in ED with right facial tingling. EEG to evaluate for seizure  Level of alertness: Awake, asleep  AEDs during EEG study: None  Technical aspects: This EEG study was done with scalp electrodes positioned according to the 10-20 International system of electrode placement. Electrical activity was reviewed with band pass filter of 1-'70Hz'$ , sensitivity of 7 uV/mm, display speed of 52m/sec with a '60Hz'$  notched filter applied as appropriate. EEG data were recorded continuously and digitally stored.  Video monitoring was available and reviewed as appropriate.  Description: The posterior dominant rhythm consists of 10 Hz activity of moderate voltage (25-35 uV) seen predominantly in posterior head regions, symmetric and reactive to eye opening and eye closing. Sleep was characterized by vertex waves, sleep spindles (12 to 14 Hz), maximal frontocentral region. Hyperventilation and photic stimulation were not performed.     IMPRESSION: This study is within normal limits. No seizures or epileptiform discharges were seen throughout the recording.  A normal interictal EEG does not exclude the diagnosis of epilepsy.  Andrae Claunch OBarbra Sarks

## 2022-04-17 NOTE — H&P (Addendum)
Hospital Admission History and Physical Service Pager: [phone]  Patient name: Meghan [name] Medical record number: [id number] Date of Birth: 1960/10/13 Age: 62 y.o. Gender: female  Primary Care Provider: Lind Covert, MD Consultants: Neurology Code Status: Full Preferred Emergency Contact: Starleen Arms (daughter) [phone]   Chief Complaint: Slurred speech  Assessment and Plan: Meghan [name] is a 62 y.o. female presenting with slurred speech and facial asymmetry. Differential for this patient's presentation of this includes CVA (MRI confirmed infarcts). PMHx includes HTN, prediabetes, IDA and B12 deficiency.  * Acute CVA (cerebrovascular accident) ([place]) Sudden onset slurred speech at 7AM associated with R facial droop. Code Stroke in the ED, CT head negative for acute bleed. Not a TNK candidate per Neuro. MRI showed small acute infarcts in the high left posterior frontal lobe. CTA negative for vessel stenosis. Admitted for further stroke work up. -Admitted to [facility] med tele, Dr. McDiarmid attending -Neuro consulted, appreciate recs -ASA '81mg'$  daily + plavix '75mg'$  daily x21 days f/b ASA '81mg'$  daily monotherapy after that  -Echo -vEEG per Neuro -Neuro checks q4h -SLP/PT/OT consulted -F/u lipid panel, A1c and TSH -Cardiac monitoring -Vitals per protocol  Thyroid nodule Incidental finding of 1.5 cm left thyroid nodule found on CTA head/neck. Non-tender thyroid on exam. -consider thyroid ultrasound as recommended  Prediabetes A1c 5.8 in 11/2021. Not currently on diabetes medication -F/u A1c  Essential hypertension BP mildly elevated to 140-160s upon admission. Compliant on Amlodipine at home. -Allow for permissive hypertension to 220/120 given CVA -Hold home Amlodipine    FEN/GI: Heart healthy VTE Prophylaxis: Lovenox  Disposition: Med tele  History of Present Illness:  Meghan [name] is a 62 y.o. female presenting with slurred speech  when brushing her teeth that started this morning around 7AM. Associated with feels like her mouth was "all twisted" at the same time. She called the [facility] clinic to schedule appt for B12 shot when she really noticed continued slurred speech. [facility] RN recommended patient go to ED for evaluation. Denies weakness, vision changes, chest pain. She had a headache the night before last. Felt fine in the days leading up to today as well. Denies checking BP at home recently.  Currently, reports that her symptoms feel the same and she has some numbness along her mouth. Denies focal weakness or loss of sensation. Passed bedside swallow and denies difficulty swallowing.  In the ED, presented as Code Stroke and seen by Neurology. CT head negative but MRI showed small acute infarcts in the high left posterior frontal lobe. CTA negative. Admitted for stroke work up.  Review Of Systems: Per HPI above  Pertinent Past Medical History: IDA HTN GERD Asthma Remainder reviewed in history tab.   Pertinent Past Surgical History: Adenoidectomy Tonsillectomy Hemorrhoid surgery  Remainder reviewed in history tab.   Pertinent Social History: Tobacco use: Former smoker (quit in 1997), smoked for 7 years, less than a half pack a day Alcohol use: Occasionally Other Substance use: No Lives alone, completes ADLs independently    Pertinent Family History: Mother: HTN, kidney disease, history of stroke  Father: HTN, DM Remainder reviewed in history tab.   Important Outpatient Medications: Albuterol Amlodipine '5mg'$  Zyrtec '10mg'$  B12 Epi pen Iron Remainder reviewed in medication history.   Objective: BP (!) 159/86 (BP Location: Right Arm)   Pulse 75   Temp 97.8 F (36.6 C) (Oral)   Resp 16   Ht '5\' 6"'$  (1.676 m)   Wt 81.6 kg   SpO2 96%  BMI 29.05 kg/m  Exam: General: Well-appearing, alert, NAD Eyes: PERRLA, mild scleral erythema bilaterally ENTM: Moist mucus membranes. Neck: Supple,  non-tender Cardiovascular: RRR without murmur Respiratory: CTAB. Normal WOB on RA Gastrointestinal: Soft, non-tender, non-distended MSK: No peripheral edema Derm: Warm, dry, no rashes noted Neuro: Slurred speech. Mild R facial droop. Intact visual field. Motor and sensation intact globally. 5/5 grip strength bilaterally. 5/5 hip flexion, dorsiflexion and plantarflexion bilaterally. Normal heel to shin. Normal finger to nose. Psych: Cooperative, pleasant   Labs:  CBC BMET  Recent Labs  Lab 04/17/22 1031 04/17/22 1123  WBC 5.2  --   HGB 13.1 13.3  HCT 38.4 39.0  PLT 279  --    Recent Labs  Lab 04/17/22 1031 04/17/22 1123  NA 141 143  K 3.5 3.5  CL 106 105  CO2 25  --   BUN 9 11  CREATININE 0.83 0.80  GLUCOSE 106* 99  CALCIUM 9.7  --       EKG: NSR   Imaging Studies Performed: CT ANGIO HEAD NECK W WO CM Result Date: 04/17/2022 IMPRESSION: 1. No large vessel occlusion or proximal hemodynamically significant stenosis. 2. Approximately 1.5 cm left thyroid nodule. Recommend thyroid US (ref: J Am Coll Radiol. 2015 Feb;12(2): 143-50).  MR BRAIN WO CONTRAST Result Date: 04/17/2022 IMPRESSION: Small acute infarcts in the high left posterior frontal lobe.  CT HEAD CODE STROKE WO CONTRAST Result Date: 04/17/2022 IMPRESSION: 1. No evidence of acute intracranial abnormality. 2. ASPECTS is 10. Code stroke imaging results were communicated on 04/17/2022 at 11:13 am to provider Dr. Cheral Marker via secure text paging.   Colletta Maryland, MD 04/17/2022, 3:33 PM PGY-1, [place] Intern pager: [phone], text pages welcome Secure chat group [name]   I was personally present and performed or re-performed the history, physical exam and medical decision making activities of this service and have verified that the service and findings are accurately documented in the intern's note. My edits are noted above with the note. Please also see  attending's attestation.   Donney Dice, DO                  04/17/2022, 4:03 PM  PGY-3, [place]

## 2022-04-17 NOTE — Telephone Encounter (Signed)
Called patient and introducted myself. She confirmed that I was speaking with Meghan [name]. She notes since she woke up this morning had been having slurred speech and trouble controlling her mouth. She notes that she is turning in [facility] on [facility] [address] [facility] now and had driven herself to the ED.  I reiterated importance of going straight to the ED.  [name] MD

## 2022-04-18 ENCOUNTER — Other Ambulatory Visit: Payer: Self-pay | Admitting: Physician Assistant

## 2022-04-18 ENCOUNTER — Observation Stay (HOSPITAL_BASED_OUTPATIENT_CLINIC_OR_DEPARTMENT_OTHER): Payer: Commercial Managed Care - HMO

## 2022-04-18 ENCOUNTER — Observation Stay (HOSPITAL_COMMUNITY): Payer: Commercial Managed Care - HMO

## 2022-04-18 DIAGNOSIS — I639 Cerebral infarction, unspecified: Secondary | ICD-10-CM

## 2022-04-18 DIAGNOSIS — I6389 Other cerebral infarction: Secondary | ICD-10-CM | POA: Diagnosis not present

## 2022-04-18 DIAGNOSIS — Q2112 Patent foramen ovale: Secondary | ICD-10-CM

## 2022-04-18 DIAGNOSIS — I10 Essential (primary) hypertension: Secondary | ICD-10-CM | POA: Diagnosis not present

## 2022-04-18 DIAGNOSIS — R471 Dysarthria and anarthria: Secondary | ICD-10-CM | POA: Diagnosis not present

## 2022-04-18 DIAGNOSIS — R944 Abnormal results of kidney function studies: Secondary | ICD-10-CM

## 2022-04-18 LAB — COMPREHENSIVE METABOLIC PANEL
ALT: 16 U/L (ref 0–44)
AST: 17 U/L (ref 15–41)
Albumin: 3.3 g/dL — ABNORMAL LOW (ref 3.5–5.0)
Alkaline Phosphatase: 57 U/L (ref 38–126)
Anion gap: 8 (ref 5–15)
BUN: 11 mg/dL (ref 8–23)
CO2: 24 mmol/L (ref 22–32)
Calcium: 9.3 mg/dL (ref 8.9–10.3)
Chloride: 107 mmol/L (ref 98–111)
Creatinine, Ser: 0.82 mg/dL (ref 0.44–1.00)
GFR, Estimated: >60 mL/min (ref >60)
Glucose, Bld: 106 mg/dL — ABNORMAL HIGH (ref 70–99)
Potassium: 3.7 mmol/L (ref 3.5–5.1)
Sodium: 139 mmol/L (ref 135–145)
Total Bilirubin: 0.6 mg/dL (ref 0.3–1.2)
Total Protein: 6.5 g/dL (ref 6.5–8.1)

## 2022-04-18 LAB — LIPID PANEL
Cholesterol: 147 mg/dL (ref 0–200)
HDL: 35 mg/dL — ABNORMAL LOW (ref >40)
LDL Cholesterol: 89 mg/dL (ref 0–99)
Total CHOL/HDL Ratio: 4.2 RATIO
Triglycerides: 113 mg/dL (ref <150)
VLDL: 23 mg/dL (ref 0–40)

## 2022-04-18 LAB — RAPID URINE DRUG SCREEN, HOSP PERFORMED
Amphetamines: NOT DETECTED
Barbiturates: NOT DETECTED
Benzodiazepines: NOT DETECTED
Cocaine: NOT DETECTED
Opiates: NOT DETECTED
Tetrahydrocannabinol: NOT DETECTED

## 2022-04-18 LAB — HIV ANTIBODY (ROUTINE TESTING W REFLEX): HIV Screen 4th Generation wRfx: NONREACTIVE

## 2022-04-18 LAB — ECHOCARDIOGRAM COMPLETE
Area-P 1/2: 2.8 cm2
Calc EF: 66.6 %
Height: 66 in
S' Lateral: 2.3 cm
Single Plane A2C EF: 70.6 %
Single Plane A4C EF: 64 %
Weight: 2880 oz

## 2022-04-18 LAB — CBC
HCT: 35.6 % — ABNORMAL LOW (ref 36.0–46.0)
Hemoglobin: 11.6 g/dL — ABNORMAL LOW (ref 12.0–15.0)
MCH: 29.4 pg (ref 26.0–34.0)
MCHC: 32.6 g/dL (ref 30.0–36.0)
MCV: 90.4 fL (ref 80.0–100.0)
Platelets: 246 10*3/uL (ref 150–400)
RBC: 3.94 MIL/uL (ref 3.87–5.11)
RDW: 14.2 % (ref 11.5–15.5)
WBC: 4.2 10*3/uL (ref 4.0–10.5)
nRBC: 0 % (ref 0.0–0.2)

## 2022-04-18 LAB — TSH: TSH: 1.257 u[IU]/mL (ref 0.350–4.500)

## 2022-04-18 MED ORDER — ACETAMINOPHEN 325 MG PO TABS
650.0000 mg | ORAL_TABLET | Freq: Once | ORAL | Status: AC
  Administered 2022-04-18: 650 mg via ORAL
  Filled 2022-04-18: qty 2

## 2022-04-18 MED ORDER — PROCHLORPERAZINE MALEATE 5 MG PO TABS
5.0000 mg | ORAL_TABLET | Freq: Once | ORAL | Status: AC
  Administered 2022-04-18: 5 mg via ORAL
  Filled 2022-04-18: qty 1

## 2022-04-18 MED ORDER — ATORVASTATIN CALCIUM 10 MG PO TABS
10.0000 mg | ORAL_TABLET | Freq: Every day | ORAL | Status: DC
  Administered 2022-04-18: 10 mg via ORAL
  Filled 2022-04-18: qty 1

## 2022-04-18 MED ORDER — ASPIRIN 81 MG PO TBEC: 81.0000 mg | DELAYED_RELEASE_TABLET | Freq: Every day | ORAL | 3 refills | Status: AC

## 2022-04-18 MED ORDER — ATORVASTATIN CALCIUM 20 MG PO TABS: 20.0000 mg | ORAL_TABLET | Freq: Every day | ORAL | 0 refills | Status: DC

## 2022-04-18 MED ORDER — CLOPIDOGREL BISULFATE 75 MG PO TABS: 75.0000 mg | ORAL_TABLET | Freq: Every day | ORAL | 0 refills | Status: DC

## 2022-04-18 MED ORDER — ATORVASTATIN CALCIUM 10 MG PO TABS: 20.0000 mg | ORAL_TABLET | Freq: Every day | ORAL | Status: DC

## 2022-04-18 NOTE — Progress Notes (Addendum)
FMTS Brief Progress Note  S: Received page from nurse that patient had pain in her R arm. Went to see patient. Patient reported some mild pain in her R shoulder that radiated down to her hand. She reports this has improved. She denies any numbness or loss in sensation.   O: BP 121/65 (BP Location: Right Arm)   Pulse (!) 59   Temp 98.1 F (36.7 C) (Oral)   Resp 18   Ht '5\' 6"'$  (1.676 m)   Wt 81.6 kg   SpO2 100%   BMI 29.05 kg/m   NEURO: alert, oriented to situation. No dysarthria or aphasia.  EOM intact.  Mild right facial droop.  5/5 strength in BUE and BLE.  No numbness or extinction to DSS. Sensation intact to light touch in arms and legs.   A/P: Stroke: Small left frontal cortical acute infarcts, etiology unclear, PFO related vs. Occult afib vs. Migrainous infarct  Seen by neurology earlier in the day and recommended outpatient TEE and loop recorder. Physical exam with no changes in neurological exam from earlier today. Suspect MSK pain vs cervical radiculopathy. Discussed with patient decreased concern for stroke and to treat pain conservatively and follow-up outpatient. Signs and symptoms concerning for stroke were discussed and patient was advised to present to ED if she had any representation of her symptoms similar to when she was admitted or new focal neurological deficits. Patient continued to desire discharge. Return precautions for ED were discussed with patient and patient agreeable.    Rolanda Lundborg, MD 04/18/2022, 7:32 PM PGY-1, [facility] Night Resident  Please page [phone] with questions.

## 2022-04-18 NOTE — Progress Notes (Signed)
Lower extremity venous bilateral and TCD w/ bubble study completed.   Please see CV Proc for preliminary results.   Jalyssa Fleisher, RDMS, RVT  

## 2022-04-18 NOTE — Discharge Instructions (Addendum)
Dear Meghan [name],   Thank you for letting us participate in your care! In this section, you will find a brief hospital admission summary of why you were admitted to the hospital, what happened during your admission, your diagnosis/diagnoses, and recommended follow up.  Primary diagnosis: Stroke Treatment plan: You were seen by Neurology and treated with Aspirin and Plavix to prevent a future stroke. You also received a work up to determine the cause of your stroke.  Your medications have been sent to your pharmacy. Based on the test done in the hospital the cause of your stroke is likely related to your heart.  You have been referred referred for a loop recorder placement with cardiology outside of the hospital.  They should call you to schedule this.   POST-HOSPITAL & CARE INSTRUCTIONS We recommend following up with your PCP within 1 week from being discharged from the hospital. Please let PCP/Specialists know of any changes in medications that were made which you will be able to see in the medications section of this packet. Please also follow up with Neurology  [facility] Future Appointments  Date Time Provider [employer]  04/18/2022  2:00 PM [facility] ECHO 5 [facility]-ECHOLAB [facility]     Thank you for choosing [facility]! Take care and be well!  [facility]  [place], [place] [address] [phone]

## 2022-04-18 NOTE — Progress Notes (Addendum)
STROKE TEAM PROGRESS NOTE   INTERVAL HISTORY   Yesterday symptoms started before headache. She was brushing teeth and noticed drooling, facial droop, numbness on the right face/lips. Speech was slurred when she called her doctor. Did not have a headache until she was in the ED. Hand numbness started in the ED. Denies palpitations or racing heart. Denies aura with migraines.  Will need 30 day monitor set up. TCD bubble positive for small PFO. Will need cardiology outpatient.   Vitals:   04/17/22 1539 04/17/22 1953 04/17/22 2323 04/18/22 0302  BP: (!) 159/86 (!) 149/79 118/61 108/60  Pulse: 75 72 (!) 57 (!) 54  Resp: '16 16 16 17  '$ Temp: 97.8 F (36.6 C) 98 F (36.7 C) (!) 97.4 F (36.3 C) 97.8 F (36.6 C)  TempSrc: Oral Oral Oral Oral  SpO2: 96% 97% 95% 97%  Weight:      Height:       CBC:  Recent Labs  Lab 04/17/22 1031 04/17/22 1123 04/18/22 0634  WBC 5.2  --  4.2  HGB 13.1 13.3 11.6*  HCT 38.4 39.0 35.6*  MCV 89.1  --  90.4  PLT 279  --  0000000   Basic Metabolic Panel:  Recent Labs  Lab 04/17/22 1031 04/17/22 1123 04/18/22 0634  NA 141 143 139  K 3.5 3.5 3.7  CL 106 105 107  CO2 25  --  24  GLUCOSE 106* 99 106*  BUN '9 11 11  '$ CREATININE 0.83 0.80 0.82  CALCIUM 9.7  --  9.3   Lipid Panel:  Recent Labs  Lab 04/18/22 0634  CHOL 147  TRIG 113  HDL 35*  CHOLHDL 4.2  VLDL 23  LDLCALC 89   HgbA1c: No results for input(s): "HGBA1C" in the last 168 hours. Urine Drug Screen:  Recent Labs  Lab 04/18/22 0029  LABOPIA NONE DETECTED  COCAINSCRNUR NONE DETECTED  LABBENZ NONE DETECTED  AMPHETMU NONE DETECTED  THCU NONE DETECTED  LABBARB NONE DETECTED    Alcohol Level No results for input(s): "ETH" in the last 168 hours.  IMAGING past 24 hours MR BRAIN WO CONTRAST  Result Date: 04/18/2022 CLINICAL DATA:  Slurred speech and facial asymmetry, stroke follow-up EXAM: MRI HEAD WITHOUT CONTRAST TECHNIQUE: Multiplanar, multiecho pulse sequences of the brain and  surrounding structures were obtained without intravenous contrast. COMPARISON:  04/17/2022 FINDINGS: Limited imaging consisting diffusion-weighted sequences and FLAIR sequence. Again noted are small areas of cortical restricted diffusion with ADC correlates in posterior left frontal lobe (series 5, images 91-92 and 96), although the areas involved have decreased in size from the prior exam. These areas are associated with slightly increased T2 hyperintense signal. No evidence of hemorrhage, mass effect, or midline shift. No new area of infarction. IMPRESSION: Limited imaging demonstrates small areas of cortical restricted diffusion and T2 hyperintense signal in the posterior left frontal lobe are again noted, consistent with acute to subacute infarcts, although the areas involved have decreased in size from the prior exam. Electronically Signed   By: Merilyn Baba M.D.   On: 04/18/2022 01:13   EEG adult  Result Date: 04/17/2022 Lora Havens, MD     04/17/2022  4:44 PM Patient Name: Meghan [name] MRN: [id number] Epilepsy Attending: Lora Havens Referring Physician/Provider: Otelia Santee, NP Date: 04/17/2022 Duration: 22.59 mins Patient history: 62yo F with slurred speech, questionable twisted mouth and increased headache in ED with right facial tingling. EEG to evaluate for seizure Level of alertness: Awake, asleep AEDs during  EEG study: None Technical aspects: This EEG study was done with scalp electrodes positioned according to the 10-20 International system of electrode placement. Electrical activity was reviewed with band pass filter of 1-'70Hz'$ , sensitivity of 7 uV/mm, display speed of 42m/sec with a '60Hz'$  notched filter applied as appropriate. EEG data were recorded continuously and digitally stored.  Video monitoring was available and reviewed as appropriate. Description: The posterior dominant rhythm consists of 10 Hz activity of moderate voltage (25-35 uV) seen predominantly in posterior head  regions, symmetric and reactive to eye opening and eye closing. Sleep was characterized by vertex waves, sleep spindles (12 to 14 Hz), maximal frontocentral region. Hyperventilation and photic stimulation were not performed.   IMPRESSION: This study is within normal limits. No seizures or epileptiform discharges were seen throughout the recording. A normal interictal EEG does not exclude the diagnosis of epilepsy. PLora Havens  CT ANGIO HEAD NECK W WO CM  Result Date: 04/17/2022 CLINICAL DATA:  Stroke/TIA, determine embolic source EXAM: CT ANGIOGRAPHY HEAD AND NECK TECHNIQUE: Multidetector CT imaging of the head and neck was performed using the standard protocol during bolus administration of intravenous contrast. Multiplanar CT image reconstructions and MIPs were obtained to evaluate the vascular anatomy. Carotid stenosis measurements (when applicable) are obtained utilizing NASCET criteria, using the distal internal carotid diameter as the denominator. RADIATION DOSE REDUCTION: This exam was performed according to the departmental dose-optimization program which includes automated exposure control, adjustment of the mA and/or kV according to patient size and/or use of iterative reconstruction technique. CONTRAST:  732mOMNIPAQUE IOHEXOL 350 MG/ML SOLN COMPARISON:  CT head same day. FINDINGS: CTA NECK FINDINGS Aortic arch: Great vessel origins are patent. No significant stenosis. Right carotid system: No evidence of dissection, stenosis (50% or greater), or occlusion. Left carotid system: No evidence of dissection, stenosis (50% or greater), or occlusion. Vertebral arteries: Codominant. No evidence of dissection, stenosis (50% or greater), or occlusion. Skeleton: No acute findings on limited assessment. Other neck: No acute findings on limited assessment. Approximate 1.5 cm left thyroid nodule. Upper chest: Visualized lung apices are clear. Review of the MIP images confirms the above findings CTA HEAD  FINDINGS Anterior circulation: Bilateral intracranial ICAs, MCAs and ACAs are patent without proximal hemodynamically significant stenosis. Posterior circulation: Bilateral intradural vertebral arteries, basilar artery and bilateral posterior cerebral arteries are patent without proximal hemodynamically significant stenosis. Small incidental fenestration of the proximal basilar artery. Venous sinuses: As permitted by contrast timing, patent. Review of the MIP images confirms the above findings IMPRESSION: 1. No large vessel occlusion or proximal hemodynamically significant stenosis. 2. Approximately 1.5 cm left thyroid nodule. Recommend thyroid USKorearef: J Am Coll Radiol. 2015 Feb;12(2): 143-50). Electronically Signed   By: FrMargaretha Sheffield.D.   On: 04/17/2022 13:21   MR BRAIN WO CONTRAST  Result Date: 04/17/2022 CLINICAL DATA:  Neuro deficit, acute, stroke suspected EXAM: MRI HEAD WITHOUT CONTRAST TECHNIQUE: Multiplanar, multiecho pulse sequences of the brain and surrounding structures were obtained without intravenous contrast. COMPARISON:  CT head from the same day. FINDINGS: Brain: Small wispy area of restricted diffusion in the high left posterior lobe with correlate hypointense ADC, compatible with acute infarcts. No significant edema or mass effect. No evidence of acute hemorrhage, mass lesion, midline shift or hydrocephalus. Vascular: Major arterial flow voids are maintained skull base. Skull and upper cervical spine: Normal marrow signal. Sinuses/Orbits: Largely clear sinuses.  No acute orbital findings. Other: No mastoid effusions. IMPRESSION: Small acute infarcts in the high left  posterior frontal lobe. Electronically Signed   By: Margaretha Sheffield M.D.   On: 04/17/2022 11:58   CT HEAD CODE STROKE WO CONTRAST  Result Date: 04/17/2022 CLINICAL DATA:  Code stroke.  Neuro deficit, acute, stroke suspected EXAM: CT HEAD WITHOUT CONTRAST TECHNIQUE: Contiguous axial images were obtained from the base of  the skull through the vertex without intravenous contrast. RADIATION DOSE REDUCTION: This exam was performed according to the departmental dose-optimization program which includes automated exposure control, adjustment of the mA and/or kV according to patient size and/or use of iterative reconstruction technique. COMPARISON:  None Available. FINDINGS: Brain: No evidence of acute large vascular territory infarction, hemorrhage, hydrocephalus, extra-axial collection or mass lesion/mass effect. Vascular: No hyperdense vessel. Skull: No acute fracture. Sinuses/Orbits: Clear sinuses.  No acute orbital findings. Other: No mastoid effusions. ASPECTS [facility] Stroke Program Early CT Score) total score (0-10 with 10 being normal): 10. IMPRESSION: 1. No evidence of acute intracranial abnormality. 2. ASPECTS is 10. Code stroke imaging results were communicated on 04/17/2022 at 11:13 am to provider Dr. Cheral Marker via secure text paging. Electronically Signed   By: Margaretha Sheffield M.D.   On: 04/17/2022 11:13    PHYSICAL EXAM  Physical Exam  Constitutional: Appears well-developed and well-nourished.   Cardiovascular: Normal rate and regular rhythm.  Respiratory: Effort normal, non-labored breathing  Neuro: Mental Status: Patient is awake, alert, oriented to person, place, month, year, and situation. Patient is able to give a clear and coherent history. Mild dysarthria  Cranial Nerves: II: Visual Fields are full. Pupils are equal, round, and reactive to light.   III,IV, VI: EOMI without ptosis or diploplia.  V: Facial sensation is symmetric to temperature VII: Mild right right facial droop  VIII: Hearing is intact to voice X: Palate elevates symmetrically XI: Shoulder shrug is symmetric. XII: Tongue protrudes midline without atrophy or fasciculations.  Motor: Tone is normal. Bulk is normal. 5/5 strength was present in all four extremities.  Sensory: Sensation is symmetric to light touch and temperature in  the arms and legs.  Cerebellar: FNF and HKS are intact bilaterally   ASSESSMENT/PLAN Ms. LOYE HAFEY is a 62 y.o. female with history of asthma, malignant hyperthermia post anesthesia, back pain and migraines who presents for evaluation of new complaints of slurred speech and "feeling like my mouth is twisted". She described numbness in her right face that traveled to her right arm.   Stroke: Small left frontal cortical acute infarcts, etiology unclear, PFO related vs. Occult afib vs. Migrainous infarct Code Stroke CT head No acute abnormality. ASPECTS 10.    CTA head & neck No LVO MRI and repeat MRI showed Small acute infarcts in the high left posterior frontal lobe 2D Echo EF 60 to 65% TCD Bubble Study- Positive for small PFO Lower extremity venous no DVT Recommend outpt TEE and loop recorder placement to rule out afib.  LDL 89 HgbA1c 5.8 VTE prophylaxis - Lovenox No antithrombotic prior to admission, now on aspirin 81 mg daily and clopidogrel 75 mg daily for 3 weeks and then ASA alone.  Therapy recommendations:  No follow up Disposition:  home today   PFO TCD bubble study showed positive for small PFO ROPE = 5 Recommend outpt TEE and if loop recorder no afib for 2-3 months, will refer to structural cardiology for second opinion.   Migraines Migraines used to be severe but have become more mild No aura with migraines Never been on maintenance medications Current HA developed after stroke onset  Hypertension Home meds:  Amlodipine Stable Long-term BP goal normotensive  Hyperlipidemia LDL 89, goal < 70 Add Atorvastatin '20mg'$   No high intensity statin given LDL not far from goal Continue statin at discharge  Other Stroke Risk Factors Former smoker quit in [facility] day # 0  Patient seen and examined by NP/APP with MD. MD to update note as needed.   Janine Ores, DNP, FNP-BC [facility] Pager: [phone]  ATTENDING NOTE: I reviewed above  note and agree with the assessment and plan. Pt was seen and examined.   Daughter at bedside.  Patient still complaining of mild headache, more on the right side of the hand, started this morning.  Patient did have headache yesterday after stroke onset but resolved overnight.  Per daughter and patient, she has frequent headache but never had aura.  Does not consistent with migrainous infarction.  Currently neuro intact.  TTE bubble study showed positive small PFO.  No DVT.  Recommend outpatient TEE and loop recorder.  If loop recorder no A-fib for 2 to 3 months, will refer to structural cardiology for second opinion.  Continue DAPT for 3 weeks and then as alone.  Continue statin.  PT and OT no recommendation.  For detailed assessment and plan, please refer to above/below as I have made changes wherever appropriate.   Neurology will sign off. Please call with questions. Pt will follow up with stroke clinic NP at [facility] in about 4 weeks. Thanks for the consult.   Rosalin Hawking, MD PhD Stroke Neurology 04/18/2022 5:15 PM    To contact Stroke Continuity provider, please refer to [email or web address] After hours, contact General Neurology

## 2022-04-18 NOTE — Evaluation (Signed)
Physical Therapy Evaluation & Discharge Patient Details Name: Meghan [name] MRN: [id number] DOB: March 18, 1960 Today's Date: 04/18/2022  History of Present Illness  Pt is a 62 y.o. female who presented 04/17/22 with slurred speech and R facial droop. MRI of brain: "Limited imaging demonstrates small areas of cortical restricted diffusion and T2 hyperintense signal in the posterior left frontal lobe are again noted, consistent with acute to subacute infarcts". PMH: HTN, prediabetes, IDA and B12 deficiency, asthma, malignant hyperthermia post anesthesia, back pain, and migraines   Clinical Impression  Pt presents with condition above and deficits mentioned below, see PT Problem List. PTA, she was independent without DME, living alone in a 2-level house with 1 STE. Her bedroom and shower are upstairs. Currently, pt appears to be functioning at her baseline, ambulating and navigating stairs without LOB, assistance, or UE support. She is independent with all functional mobility and scored a 24/24 on the DGI, further supporting her balance being [facility]. She did display some mild weakness more proximally in her R leg along with dysdiadochokinesia, but these deficits did not appear to affect her mobility. All education completed and questions answered. PT will sign off.     Recommendations for follow up therapy are one component of a multi-disciplinary discharge planning process, led by the attending physician.  Recommendations may be updated based on patient status, additional functional criteria and insurance authorization.  Follow Up Recommendations No PT follow up      Assistance Recommended at Discharge PRN  Patient can return home with the following  Assist for transportation    Equipment Recommendations None recommended by PT  Recommendations for Other Services       Functional Status Assessment Patient has not had a recent decline in their functional status     Precautions / Restrictions  Precautions Precautions: None Restrictions Weight Bearing Restrictions: No      Mobility  Bed Mobility Overal bed mobility: Modified Independent             General bed mobility comments: Able to transition supine > sit R EOB with HOB elevated without assistance    Transfers Overall transfer level: Independent Equipment used: None               General transfer comment: Able to come to stand without LOB or assistance    Ambulation/Gait Ambulation/Gait assistance: Independent Gait Distance (Feet): 250 Feet Assistive device: None Gait Pattern/deviations: WFL(Within Functional Limits) Gait velocity: WFL Gait velocity interpretation: >4.37 ft/sec, indicative of normal walking speed   General Gait Details: No specific gait deviations or asymmetry noted. No LOB, even with DGI challenges.  Stairs Stairs: Yes Stairs assistance: Independent Stair Management: No rails, Alternating pattern Number of Stairs: 6 General stair comments: Ascends and descends without LOB or assistance  Wheelchair Mobility    Modified Rankin (Stroke Patients Only) Modified Rankin (Stroke Patients Only) Pre-Morbid Rankin Score: No symptoms Modified Rankin: No significant disability     Balance Overall balance assessment: No apparent balance deficits (not formally assessed)                               Standardized Balance Assessment Standardized Balance Assessment : Dynamic Gait Index   Dynamic Gait Index Level Surface: Normal Change in Gait Speed: Normal Gait with Horizontal Head Turns: Normal Gait with Vertical Head Turns: Normal Gait and Pivot Turn: Normal Step Over Obstacle: Normal Step Around Obstacles: Normal Steps: Normal Total Score: 24  Pertinent Vitals/Pain Pain Assessment Pain Assessment: 0-10 Pain Score: 7  Pain Location: headache Pain Descriptors / Indicators: Headache Pain Intervention(s): Limited activity within patient's tolerance,  Monitored during session, Repositioned, Patient requesting pain meds-RN notified    Home Living Family/patient expects to be discharged to:: Private residence Living Arrangements: Alone Available Help at Discharge: Family;Friend(s);Available 24 hours/day Type of Home: House Home Access: Stairs to enter Entrance Stairs-Rails: None Entrance Stairs-Number of Steps: 1 Alternate Level Stairs-Number of Steps: 10 Home Layout: Two level;1/2 bath on main level;Bed/bath upstairs;Able to live on main level with bedroom/bathroom (can stay on couch downstairs if needed) Home Equipment: None      Prior Function Prior Level of Function : Independent/Modified Independent;Working/employed;Driving               ADLs Comments: 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     Hand Dominance   Dominant Hand: Left    Extremity/Trunk Assessment   Upper Extremity Assessment Upper Extremity Assessment: Defer to OT evaluation RUE Deficits / Details: some decreased sensation R fingertips RUE Sensation: decreased light touch RUE Coordination: WNL    Lower Extremity Assessment Lower Extremity Assessment: RLE deficits/detail RLE Deficits / Details: weakness noted more proximally with MMT scores of 4 hip flexion, 4+ knee extension, 5 ankle dorsiflexion (5/5 on L grossly); denies numbness/tingling and able to detect light touch throughout; dysdiadochokinesia noted RLE Sensation: WNL RLE Coordination: decreased gross motor;decreased fine motor    Cervical / Trunk Assessment Cervical / Trunk Assessment: Normal  Communication   Communication: Other (comment) (potentially mildly slurred)  Cognition Arousal/Alertness: Awake/alert Behavior During Therapy: WFL for tasks assessed/performed Overall Cognitive Status: Within Functional Limits for tasks assessed                                 General Comments: May need further assessment, but followed simple cues appropriately during PT  session        General Comments General comments (skin integrity, edema, etc.): reviewed "BE FAST"; educated pt on exercises she can perform to strengthen her R leg    Exercises     Assessment/Plan    PT Assessment Patient does not need any further PT services  PT Problem List         PT Treatment Interventions      PT Goals (Current goals can be found in the Care Plan section)  Acute Rehab PT Goals Patient Stated Goal: to go home PT Goal Formulation: All assessment and education complete, DC therapy Time For Goal Achievement: 04/19/22 Potential to Achieve Goals: Good    Frequency       Co-evaluation               AM-PAC PT "6 Clicks" Mobility  Outcome Measure Help needed turning from your back to your side while in a flat bed without using bedrails?: None Help needed moving from lying on your back to sitting on the side of a flat bed without using bedrails?: None Help needed moving to and from a bed to a chair (including a wheelchair)?: None Help needed standing up from a chair using your arms (e.g., wheelchair or bedside chair)?: None Help needed to walk in hospital room?: None Help needed climbing 3-5 steps with a railing? : None 6 Click Score: 24    End of Session Equipment Utilized During Treatment: Gait belt Activity Tolerance: Patient tolerated treatment well Patient left: in chair;with  call bell/phone within reach Nurse Communication: Mobility status;Patient requests pain meds;Other (comment) (pt independent with no chair alarm) PT Visit Diagnosis: Other symptoms and signs involving the nervous system [id number])    [id number] PT Time Calculation (min) (ACUTE ONLY): 21 min   Charges:   PT Evaluation $PT Eval Low Complexity: 1 Low          Moishe Spice, PT, DPT Acute Rehabilitation Services  Office: [facility] 04/18/2022, 11:32 AM

## 2022-04-18 NOTE — Progress Notes (Signed)
Patient returned to Unit from MRI. TELE reapplied.

## 2022-04-18 NOTE — Progress Notes (Signed)
Echocardiogram 2D Echocardiogram has been performed.  Meghan [name] 04/18/2022, 11:46 AM

## 2022-04-18 NOTE — Progress Notes (Addendum)
Daily Progress Note Intern Pager: [phone]  Patient name: Meghan [name] Medical record number: [id number] Date of birth: [birth year] Age: 62 y.o. Gender: female  Primary Care Provider: Lind Covert, MD Consultants: Neurology Code Status: Full code  Pt Overview and Major Events to Date:  3/7: Admitted to FMTS  Assessment and Plan: Meghan [name] is a 62 y.o. female presenting with slurred speech and facial asymmetry. Differential for this patient's presentation of this includes CVA (MRI confirmed infarcts). PMHx includes HTN, prediabetes, IDA and B12 deficiency.   * Acute CVA (cerebrovascular accident) ([place]) Improving symptoms with only mild slurred speech. EEG normal. Repeat MRI confirmed stroke. TSH normal. LDL 89. Per neuro will place 30 day monitor given occult stroke. -Neuro consulted, appreciate recs -ASA '81mg'$  daily + plavix '75mg'$  daily x21 days f/b ASA '81mg'$  daily monotherapy after that  -Echo -SLP/PT/OT consulted -Atorvastatin '20mg'$  per Neuro -DVT US -Transcranial doppler pending  Thyroid nodule Incidental finding of 1.5 cm left thyroid nodule found on CTA head/neck. Non-tender thyroid on exam. -F/u thyroid US outpatient  Prediabetes Glu 106 on AM labs, well controlled. -F/u A1c  Essential hypertension Normotensive. -Continue to allow for permissive hypertension to 220/120 as needed -Hold home Amlodipine   FEN/GI: Heart healthy VTE Prophylaxis: Lovenox Dispo: Home pending neurologic clearance  Subjective:  Patient assessed at bedside, states she feels well. Reports her slurred speech has improved and the R side of her face is just a little numb. Denies focal deficits. Mild HA this AM, willing to try dose of Tylenol for relief. Otherwise would like to go home today if possible and remove IV when able.  Objective: Temp:  [97.4 F (36.3 C)-98 F (36.7 C)] 98 F (36.7 C) (03/08 0735) Pulse Rate:  [54-75] 71 (03/08 0735) Resp:  [11-18] 18 (03/08  0735) BP: (104-164)/(60-117) 116/71 (03/08 0735) SpO2:  [95 %-100 %] 100 % (03/08 0735) Physical Exam: General: Well-appearing female sitting up in bed, NAD Cardiovascular: RRR, no murmur Respiratory: CTAB. Normal WOB on RA Abdomen: Soft, non-tender Extremities: No peripheral edema Neuro: Mild R facial drop. Improved slurred speech. Remainder of CN intact. Motor and sensation intact globally. 5/5 grip strength and hip flexion.  Laboratory: Most recent CBC Lab Results  Component Value Date   WBC 4.2 04/18/2022   HGB 11.6 (L) 04/18/2022   HCT 35.6 (L) 04/18/2022   MCV 90.4 04/18/2022   PLT 246 04/18/2022   Most recent BMP    Latest Ref Rng & Units 04/18/2022    6:34 AM  BMP  Glucose 70 - 99 mg/dL 106   BUN 8 - 23 mg/dL 11   Creatinine 0.44 - 1.00 mg/dL 0.82   Sodium 135 - 145 mmol/L 139   Potassium 3.5 - 5.1 mmol/L 3.7   Chloride 98 - 111 mmol/L 107   CO2 22 - 32 mmol/L 24   Calcium 8.9 - 10.3 mg/dL 9.3     Other pertinent labs: UDS: neg  Imaging/Diagnostic Tests: MR BRAIN WO CONTRAST Result Date: 04/18/2022 IMPRESSION: Limited imaging demonstrates small areas of cortical restricted diffusion and T2 hyperintense signal in the posterior left frontal lobe are again noted, consistent with acute to subacute infarcts, although the areas involved have decreased in size from the prior exam.   EEG adult Result Date: 04/17/2022 IMPRESSION: This study is within normal limits. No seizures or epileptiform discharges were seen throughout the recording. A normal interictal EEG does not exclude the diagnosis of epilepsy. Priyanka Barbra Sarks  CT ANGIO HEAD NECK W WO CM Result Date: 04/17/2022 IMPRESSION: 1. No large vessel occlusion or proximal hemodynamically significant stenosis. 2. Approximately 1.5 cm left thyroid nodule. Recommend thyroid US (ref: J Am Coll Radiol. 2015 Feb;12(2): 143-50).  MR BRAIN WO CONTRAST Result Date: 04/17/2022 IMPRESSION: Small acute infarcts in the high left  posterior frontal lobe.  CT HEAD CODE STROKE WO CONTRAST Result Date: 04/17/2022 IMPRESSION: 1. No evidence of acute intracranial abnormality. 2. ASPECTS is 10. Code stroke imaging results were communicated on 04/17/2022 at 11:13 am to provider Dr. Cheral Marker via secure text paging.   Colletta Maryland, MD 04/18/2022, 11:01 AM  PGY-1, [place] Intern pager: [phone], text pages welcome Secure chat group [place]

## 2022-04-18 NOTE — Progress Notes (Signed)
Patient left unit for MRI. CCMD notified .

## 2022-04-18 NOTE — Discharge Summary (Signed)
[facility]  Patient name: Meghan [name] Medical record number: [id number] Date of birth: Sep 01, 1960 Age: 62 y.o. Gender: female Date of Admission: 04/17/2022  Date of Discharge:04/18/22 Admitting Physician: Blane Ohara McDiarmid, MD  Primary Care Provider: Lind Covert, MD Consultants: Neurology  Indication for Hospitalization: Acute CVA  Discharge Diagnoses/Problem List:  Principal Problem for Admission: Acute CVA Other Problems addressed during stay:  Principal Problem:   Acute CVA (cerebrovascular accident) [facility]) Active Problems:   Essential hypertension   Dysarthria   Prediabetes   Thyroid nodule  Brief Hospital Course:  Meghan [name] is a 62 y.o.female with a history of HTN, prediabetes, IDA and B12 deficiency who was admitted to the [facility] Medicine Teaching Service at [facility] for slurred speech. Her hospital course is detailed below:  Acute CVA Onset of slurred speech and R facial droop around 7AM on 3/7, presented as Code Stroke to ED. Evaluated by Neuro, not a TNK candidate. CT head negative. MRI showed small acute infarcts in the high left posterior frontal lobe. Given atypical presentation, repeat MRI confirmed CVAs and EEG negative. CTA negative for vessel stenosis. Started on ASA and Plavix x 3 weeks, then ASA monotherapy after. DVT US was negative and transcranial doppler  reflect possible PFO. LDL mildly elevated, started on Lipitor '10mg'$  per Neuro. At discharge patient was stable and discharged with loop recorder to assess possible cardiac causes with planned follow up with cardiology   Other chronic conditions were medically managed with home medications and formulary alternatives as necessary (HTN, prediabetes)  PCP Follow-up Recommendations:  Recommended thyroid US, with 1.5 cm left thyroid nodule noted on CTA head/neck Statin added during hospitalization Aspirin and plavix for 21 days, then just aspirin.   Ensure follow-up with cardiology  Disposition: Home  Discharge Condition: Stable  Discharge Exam:  Vitals:   04/18/22 1117 04/18/22 1522  BP: 133/74 121/65  Pulse: (!) 56 (!) 59  Resp: 19 18  Temp: 97.7 F (36.5 C) 98.1 F (36.7 C)  SpO2: 100% 100%   General: Well-appearing female sitting up in bed, NAD Cardiovascular: RRR, no murmur Respiratory: CTAB. Normal WOB on RA Abdomen: Soft, non-tender Extremities: No peripheral edema Neuro: Mild R facial drop. Improved slurred speech. Remainder of CN intact. Motor and sensation intact globally. 5/5 grip strength and hip flexion.  Significant Procedures: None  Significant Labs and Imaging:  Recent Labs  Lab 04/17/22 1031 04/17/22 1123 04/18/22 0634  WBC 5.2  --  4.2  HGB 13.1 13.3 11.6*  HCT 38.4 39.0 35.6*  PLT 279  --  246   Recent Labs  Lab 04/17/22 1031 04/17/22 1123 04/18/22 0634  NA 141 143 139  K 3.5 3.5 3.7  CL 106 105 107  CO2 25  --  24  GLUCOSE 106* 99 106*  BUN '9 11 11  '$ CREATININE 0.83 0.80 0.82  CALCIUM 9.7  --  9.3  ALKPHOS 73  --  57  AST 23  --  17  ALT 20  --  16  ALBUMIN 3.8  --  3.3*    Pertinent Imaging   Transcranial Doppler with bubble Less than 10 high intensity transient signals (HITS) observed at rest, indicating a Spencer Grade 1 patent foramen ovale (PFO) at rest. Greater than 30 HITS observed with valsalva, indicating a Spencer Grade 3 PFO with valsalva (low end of range).   MRI Brain IMPRESSION: Limited imaging demonstrates small areas of cortical restricted diffusion and T2 hyperintense  signal in the posterior left frontal lobe are again noted, consistent with acute to subacute infarcts, although the areas involved have decreased in size from the prior exam.  CT angio head and neck IMPRESSION: 1. No large vessel occlusion or proximal hemodynamically significant stenosis. 2. Approximately 1.5 cm left thyroid nodule. Recommend thyroid US  CT head WO stroke IMPRESSION: 1.  No evidence of acute intracranial abnormality. 2. ASPECTS is 10.       Results/Tests Pending at Time of Discharge: None  Discharge Medications:  Allergies as of 04/18/2022       Reactions   Epinephrine Other (See Comments)   Shuts body down   Shellfish Allergy Itching, Swelling   Codeine Hives, Itching   Latex Swelling, Hives   Per allergist   Lidocaine Swelling   Disorientation, shocks system  OK to use mepivacaine    Metronidazole Hives, Itching   Morphine    Shuts system down   Other    malignant hyperthermia   Oxycodone    Shut system down    Oxycodone-acetaminophen    Shuts system down   Sulfonamide Derivatives Hives, Itching        Medication List     STOP taking these medications    amLODipine 5 MG tablet Commonly known as: NORVASC   cyclobenzaprine 10 MG tablet Commonly known as: FLEXERIL       TAKE these medications    albuterol 108 (90 Base) MCG/ACT inhaler Commonly known as: VENTOLIN HFA INHALE 2 PUFFS INTO LUNGS EVERY 6 HOURS AS NEEDED FOR WHEEZING FOR SHORTNESS OF BREATH What changed: See the new instructions.   aspirin EC 81 MG tablet Take 1 tablet (81 mg total) by mouth daily. Swallow whole.   atorvastatin 20 MG tablet Commonly known as: LIPITOR Take 1 tablet (20 mg total) by mouth daily. Start taking on: April 19, 2022   B-12 Compliance Injection 1000 MCG/ML Kit Generic drug: Cyanocobalamin Inject 1,000 mcg as directed every 30 (thirty) days.   cetirizine 10 MG tablet Commonly known as: ZYRTEC Take 10 mg by mouth daily as needed for allergies.   clopidogrel 75 MG tablet Commonly known as: PLAVIX Take 1 tablet (75 mg total) by mouth daily.   EPINEPHrine 0.3 mg/0.3 mL Soaj injection Commonly known as: EPI-PEN Inject 0.3 mg into the muscle as needed for anaphylaxis.   ferrous sulfate 325 (65 FE) MG tablet Take 1 tablet (325 mg total) by mouth every other day.   MAGNESIUM PO Take 1 tablet by mouth 2 (two) times daily.    multivitamin with minerals Tabs tablet Take 1 tablet by mouth daily.        Discharge Instructions: Please refer to Patient Instructions section of EMR for full details.  Patient was counseled important signs and symptoms that should prompt return to medical care, changes in medications, dietary instructions, activity restrictions, and follow up appointments.   Follow-Up Appointments:  Follow-up Information     Lind Covert, MD. Go on 04/23/2022.   Specialty: Family Medicine Why: Appointment at 10:10 am, please arrive at least 15 min prior to your scheduled appointment time. Contact information: [place] [address] [place]. Schedule an appointment as soon as possible for a visit in 1 week(s).   Specialty: Rehabilitation Contact information: [address] [place] [id number] [place] [phone]  Alen Bleacher, MD 04/18/2022, 5:17 PM PGY-1, [place]

## 2022-04-18 NOTE — Evaluation (Signed)
Speech Language Pathology Evaluation Patient Details Name: Meghan [name] MRN: [id number] DOB: 1960-10-14 Today's Date: 04/18/2022 Time: [id number] SLP Time Calculation (min) (ACUTE ONLY): 11 min  Problem List:  Patient Active Problem List   Diagnosis Date Noted   Acute CVA (cerebrovascular accident) ([place]) 04/17/2022   Dysarthria 04/17/2022   Prediabetes 04/17/2022   Thyroid nodule 04/17/2022   Screening-pulmonary TB 12/03/2021   Family history of diabetes mellitus 12/02/2021   Foot drop, right foot 06/19/2021   Shellfish allergy 06/19/2021   Essential hypertension 06/19/2021   Screening cholesterol level 11/19/2020   Allergic contact dermatitis 09/24/2020   Mild intermittent asthma without complication 0000000   Angioedema of lips 09/17/2020   Muscle spasms of neck 11/06/2019   Abnormal Pap smear of cervix [id number]   Lichen planus Q000111Q   Seborrheic keratoses 06/11/2017   History of anaphylaxis 03/04/2017   Vitamin B 12 deficiency 01/14/2016   Headache(784.0) 10/01/2010   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   Polyp of colon, adenomatous 01/29/2007   GERD (gastroesophageal reflux disease) 01/29/2007   Osteoarthritis, multiple sites 04/09/2006   Past Medical History:  Past Medical History:  Diagnosis Date   Angio-edema    Asthma    Back pain [id number]   Lichen planus    Malignant hyperthermia    1988, 1987   Unspecified sinusitis (chronic) 08/12/2012   Urticaria    Past Surgical History:  Past Surgical History:  Procedure Laterality Date   ADENOIDECTOMY     COLONOSCOPY WITH PROPOFOL N/A 08/02/2021   Procedure: COLONOSCOPY WITH PROPOFOL;  Surgeon: Carol Ada, MD;  Location: [facility];  Service: Gastroenterology;  Laterality: N/A;   ESOPHAGOGASTRODUODENOSCOPY (EGD) WITH PROPOFOL N/A 02/25/2019   Procedure: ESOPHAGOGASTRODUODENOSCOPY (EGD) WITH PROPOFOL;  Surgeon: Carol Ada, MD;  Location: [facility];  Service: Endoscopy;  Laterality: N/A;    ESOPHAGOGASTRODUODENOSCOPY (EGD) WITH PROPOFOL N/A 08/02/2021   Procedure: ESOPHAGOGASTRODUODENOSCOPY (EGD) WITH PROPOFOL;  Surgeon: Carol Ada, MD;  Location: [facility];  Service: Gastroenterology;  Laterality: N/A;   HEMOSTASIS CLIP PLACEMENT  02/25/2019   Procedure: HEMOSTASIS CLIP PLACEMENT;  Surgeon: Carol Ada, MD;  Location: [facility];  Service: Endoscopy;;   HEMOSTASIS CLIP PLACEMENT  08/02/2021   Procedure: HEMOSTASIS CLIP PLACEMENT;  Surgeon: Carol Ada, MD;  Location: [facility];  Service: Gastroenterology;;   POLYPECTOMY  02/25/2019   Procedure: POLYPECTOMY;  Surgeon: Carol Ada, MD;  Location: [facility];  Service: Endoscopy;;   POLYPECTOMY  08/02/2021   Procedure: POLYPECTOMY;  Surgeon: Carol Ada, MD;  Location: [facility];  Service: Gastroenterology;;  EGD and [place]  2000   for fibroids    HPI:  Pt is a 62 y.o. female who presented 04/17/22 with slurred speech and R facial droop. MRI of brain: "Limited imaging demonstrates small areas of cortical restricted diffusion and T2 hyperintense signal in the posterior left frontal lobe are again noted, consistent with acute to subacute infarcts". PMH: HTN, prediabetes, IDA and B12 deficiency, asthma, malignant hyperthermia post anesthesia, back pain, and migraines   Assessment / Plan / Recommendation Clinical Impression  Pt states her dysarthria has resolved from yesterday and she is 100% intelligible. Receptive and expressive language is intact and oromotor exam was unremarkable. Pt did not have concerns with her cognition. She scored a 25/30 on the SLUMS revealing mild deficits in memory (recalled 4/5- retrieved with semantic cue), inaccurate on length of hands on clock drawing and missed 2 points on paragraph  recall. OT recommended outpatient for higher level cognition. When asked what she feels like she needs to improve in regards to  cognition she stated she was uncertain as she has "never had this happen before." She stated she is in agreement to having further cogntive assessment at outpatient. SLP suspects she will either not need further follow after assessment at outpatient or will be short term. ST will not follow while in acute care.    SLP Assessment  SLP Recommendation/Assessment: All further Speech Lanaguage Pathology  needs can be addressed in the next venue of care SLP Visit Diagnosis: Cognitive communication deficit (R41.841)    Recommendations for follow up therapy are one component of a multi-disciplinary discharge planning process, led by the attending physician.  Recommendations may be updated based on patient status, additional functional criteria and insurance authorization.    Follow Up Recommendations  Outpatient SLP    Assistance Recommended at Discharge  None  Functional Status Assessment Patient has had a recent decline in their functional status and demonstrates the ability to make significant improvements in function in a reasonable and predictable amount of time.  Frequency and Duration           SLP Evaluation Cognition  Overall Cognitive Status: Impaired/Different from baseline (minimal impairments) Arousal/Alertness: Awake/alert Orientation Level: Oriented X4 Year: 2024 Day of Week: Correct Attention: Sustained Sustained Attention: Appears intact Memory: Impaired (4/5 word recall) Memory Impairment: Retrieval deficit Awareness: Appears intact Problem Solving: Appears intact Safety/Judgment: Appears intact       Comprehension  Auditory Comprehension Overall Auditory Comprehension: Appears within functional limits for tasks assessed Visual Recognition/Discrimination Discrimination: Not tested Reading Comprehension Reading Status: Not tested    Expression Expression Primary Mode of Expression: Verbal Verbal Expression Overall Verbal Expression: Appears within functional  limits for tasks assessed Initiation: No impairment Repetition:  (NT) Naming: No impairment Pragmatics: No impairment Written Expression Dominant Hand: Left Written Expression: Not tested   Oral / Motor  Oral Motor/Sensory Function Overall Oral Motor/Sensory Function: Within functional limits Motor Speech Overall Motor Speech: Appears within functional limits for tasks assessed Respiration: Within functional limits Phonation: Normal Resonance: Within functional limits Articulation: Within functional limitis Intelligibility: Intelligible Motor Planning: Witnin functional limits            Houston Siren 04/18/2022, 1:23 PM

## 2022-04-18 NOTE — Progress Notes (Signed)
Reviewed DC paperwork with pt. She voiced understanding of her instructions

## 2022-04-18 NOTE — TOC Initial Note (Signed)
Transition of Care [facility]) - Initial/Assessment Note    Patient Details  Name: Meghan [name] MRN: [id number] Date of Birth: 1960/11/12  Transition of Care [facility]) CM/SW Contact:    Pollie Friar, RN Phone Number: 04/18/2022, 1:58 PM  Clinical Narrative:                 Pt is from home alone. She states her children check on her and can stay with her a few days after d/c if needed. No DME at home.  She drives self but says her daughters can drive her to appts if needed.  Pt manages her own medications and denies any issues. [facility] pharmacy of choice [facility].  Outpatient OT/ST recommended. Pt prefers to attend at [facility]. Pt to call after discharge to arrange a visit  PCP: [place]. TOC following for further d/c needs.     Expected Discharge Plan: OP Rehab Barriers to Discharge: Continued Medical Work up   Patient Goals and CMS Choice     Choice offered to / list presented to : Patient      Expected Discharge Plan and Services   Discharge Planning Services: CM Consult   Living arrangements for the past 2 months: Single Family Home                                      Prior Living Arrangements/Services Living arrangements for the past 2 months: Single Family Home Lives with:: Self Patient language and need for interpreter reviewed:: Yes Do you feel safe going back to the place where you live?: Yes        Care giver support system in place?: Yes (comment)   Criminal Activity/Legal Involvement Pertinent to Current Situation/Hospitalization: No - Comment as needed  Activities of Daily Living      Permission Sought/Granted                  Emotional Assessment Appearance:: Appears stated age Attitude/Demeanor/Rapport: Engaged Affect (typically observed): Accepting Orientation: : Oriented to Self, Oriented to Place, Oriented to  Time, Oriented to Situation   Psych Involvement: No (comment)  Admission  diagnosis:  Stroke [facility]) [I63.9] Dysarthria [R47.1] Essential hypertension [I10] Acute CVA (cerebrovascular accident) [facility]) [I63.9] Patient Active Problem List   Diagnosis Date Noted   Acute CVA (cerebrovascular accident) ([place]) 04/17/2022   Dysarthria 04/17/2022   Prediabetes 04/17/2022   Thyroid nodule 04/17/2022   Screening-pulmonary TB 12/03/2021   Family history of diabetes mellitus 12/02/2021   Foot drop, right foot 06/19/2021   Shellfish allergy 06/19/2021   Essential hypertension 06/19/2021   Screening cholesterol level 11/19/2020   Allergic contact dermatitis 09/24/2020   Mild intermittent asthma without complication 0000000   Angioedema of lips 09/17/2020   Muscle spasms of neck 11/06/2019   Abnormal Pap smear of cervix Q000111Q   Lichen planus Q000111Q   Seborrheic keratoses 06/11/2017   History of anaphylaxis 03/04/2017   Vitamin B 12 deficiency 01/14/2016   Headache(784.0) 10/01/2010   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

## 2022-04-18 NOTE — Evaluation (Signed)
Occupational Therapy Evaluation Patient Details Name: Meghan [name] MRN: [id number] DOB: 17-Nov-1960 Today's Date: 04/18/2022   History of Present Illness Pt is a 62 y.o. female who presented 04/17/22 with slurred speech and R facial droop. MRI of brain: "Limited imaging demonstrates small areas of cortical restricted diffusion and T2 hyperintense signal in the posterior left frontal lobe are again noted, consistent with acute to subacute infarcts". PMH: HTN, prediabetes, IDA and B12 deficiency, asthma, malignant hyperthermia post anesthesia, back pain, and migraines   Clinical Impression   PTA Patient independent and working. Admitted for above and presents with problem list below. She reports some numbness in R fingertips but otherwise coordination and strength WFL.  She completes ADLs and functional mobility with independence.  Noted difficulty with dual cognitive tasks and problem solving (sequencing months backwards), and discussed with pt who reports preference to follow up with outpatient OT for higher level cognition.  Will follow acutely.      Recommendations for follow up therapy are one component of a multi-disciplinary discharge planning process, led by the attending physician.  Recommendations may be updated based on patient status, additional functional criteria and insurance authorization.   Follow Up Recommendations  Outpatient OT     Assistance Recommended at Discharge PRN  Patient can return home with the following Assist for transportation    Functional Status Assessment  Patient has had a recent decline in their functional status and demonstrates the ability to make significant improvements in function in a reasonable and predictable amount of time.  Equipment Recommendations  None recommended by OT    Recommendations for Other Services       Precautions / Restrictions Precautions Precautions: Fall Restrictions Weight Bearing Restrictions: No      Mobility  Bed Mobility               General bed mobility comments: OOB in recliner    Transfers Overall transfer level: Independent                        Balance Overall balance assessment: No apparent balance deficits (not formally assessed)                                         ADL either performed or assessed with clinical judgement   ADL Overall ADL's : Independent                                       General ADL Comments: pt demonstrates ability to complete ADLs with independence     Vision   Vision Assessment?: No apparent visual deficits     Perception     Praxis      Pertinent Vitals/Pain Pain Assessment Pain Assessment: Faces Faces Pain Scale: Hurts even more Pain Location: migrane headache Pain Descriptors / Indicators: Headache Pain Intervention(s): Limited activity within patient's tolerance, Monitored during session, Repositioned     Hand Dominance Left   Extremity/Trunk Assessment Upper Extremity Assessment Upper Extremity Assessment: RUE deficits/detail RUE Deficits / Details: some decreased sensation R fingertips RUE Sensation: decreased light touch RUE Coordination: WNL   Lower Extremity Assessment Lower Extremity Assessment: Defer to PT evaluation       Communication Communication Communication: Other (comment) (mildly slurred speech)   Cognition Arousal/Alertness: Awake/alert Behavior  During Therapy: WFL for tasks assessed/performed Overall Cognitive Status: Impaired/Different from baseline Area of Impairment: Attention, Problem solving                   Current Attention Level: Alternating         Problem Solving: Difficulty sequencing General Comments: pt demonstrating some difficulty with dual cognitive tasks, sequencing months backwards.     General Comments  discussed OT role in higher level cognition, pt voices preference to have outpatient referral just in case.   Question if migrane is affecting cognitive attention and sequencing this date.    Exercises     Shoulder Instructions      Home Living Family/patient expects to be discharged to:: Private residence Living Arrangements: Alone Available Help at Discharge: Family;Friend(s);Available 24 hours/day Type of Home: House Home Access: Stairs to enter [employer] of Steps: 1 Entrance Stairs-Rails: None Home Layout: Two level;1/2 bath on main level;Bed/bath upstairs;Able to live on main level with bedroom/bathroom (can stay on couch downstairs if needed) Alternate Level Stairs-Number of Steps: 10 Alternate Level Stairs-Rails: Can reach both Bathroom Shower/Tub: Teacher, early years/pre: Standard Bathroom Accessibility: No   Home Equipment: None          Prior Functioning/Environment Prior Level of Function : Independent/Modified Independent;Working/employed;Driving               ADLs Comments: 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        OT Problem List: Decreased cognition      OT Treatment/Interventions: Cognitive remediation/compensation    OT Goals(Current goals can be found in the care plan section) Acute Rehab OT Goals Patient Stated Goal: home OT Goal Formulation: With patient Time For Goal Achievement: 05/02/22 Potential to Achieve Goals: Good  OT Frequency: Min 2X/week    Co-evaluation              AM-PAC OT "6 Clicks" Daily Activity     Outcome Measure Help from another person eating meals?: None Help from another person taking care of personal grooming?: None Help from another person toileting, which includes using toliet, bedpan, or urinal?: None Help from another person bathing (including washing, rinsing, drying)?: None Help from another person to put on and taking off regular upper body clothing?: None Help from another person to put on and taking off regular lower body clothing?: None 6 Click Score: 24   End of  Session Nurse Communication: Mobility status  Activity Tolerance: Patient tolerated treatment well Patient left: in chair;with call bell/phone within reach  OT Visit Diagnosis: Other symptoms and signs involving cognitive function;Pain Pain - part of body:  (headache)                Time: [id number] OT Time Calculation (min): 21 min Charges:  OT General Charges $OT Visit: 1 Visit OT Evaluation $OT Eval Low Complexity: 1 Low  Jolaine Artist, OT Acute Rehabilitation Services Office [phone]   Delight Stare 04/18/2022, 10:53 AM

## 2022-04-19 LAB — HEMOGLOBIN A1C
Hgb A1c MFr Bld: 5.6 % (ref 4.8–5.6)
Mean Plasma Glucose: 114 mg/dL

## 2022-04-21 ENCOUNTER — Telehealth: Payer: Self-pay | Admitting: Student

## 2022-04-21 NOTE — Telephone Encounter (Signed)
Pt called in, she has an appt with T[place], PA on 04/29/22 but she would like a further explanation on what the appt is for.   She states the hospital told her something about a patch.   She also expressed concern about any time of procedures because she has malignant hyperthermia.   Please advise.

## 2022-04-21 NOTE — Telephone Encounter (Signed)
Called pt who is concerned about upcoming OV and status of heart monitor.  Concerned that can not have loop recorder d/t hyperthermia.  Advised pt will not need to be put to sleep for loop recorder implant.  Pt reports any type of anesthesia she will react to.  Advised I will send concern to Provider to address.  Pt expresses thought would leave hospital with monitor but was not given 1 prior to discharge.   Advised pt I see the order but will check with monitor tech to see if can come in to have it placed.   Will call pt back once I have a reply.

## 2022-04-21 NOTE — Telephone Encounter (Signed)
Called pt advised that per monitor tech 30 day heart monitor was canceled d/t consult for loop recorder.   Pt concerned about procedure for loop recorder insert.  Advised pt that will not be put to sleep will numb area and inject under skin.   Pt would like to know if loop recorder will placed at 04/29/22 OV.  Advised more than likely not.  Pt wants to know if can have inserted same day as OV.  Will send to [place], [place] to advise.

## 2022-04-21 NOTE — Telephone Encounter (Signed)
Pt stated she is still confused with the process of getting a loop recorder. Her daughter will have to take off from work to bring her to the clinic. I offered to see if the APP will change upcomming appointment to telephone visit, patient declined. Will forward to APP.

## 2022-04-22 NOTE — Telephone Encounter (Signed)
Pt is RS to Dr. Myles Gip schedule on 3/18 (she needed this day or the 19th to work with her schedule). Pt would like to know if she can resume normal activity until her appt.

## 2022-04-23 ENCOUNTER — Ambulatory Visit: Payer: Commercial Managed Care - HMO | Admitting: Family Medicine

## 2022-04-25 NOTE — Telephone Encounter (Signed)
Spoke with patient and discussed with her that it is OK to resume her usual activity until her appt with Dr. Myles Gip on 04/28/22. Advised patient to not overexert herself and take breaks as needed, activity as tolerated.  Patient verbalized understanding and expressed appreciation for call.

## 2022-04-25 NOTE — Therapy (Signed)
OUTPATIENT SPEECH LANGUAGE PATHOLOGY EVALUATION   Patient Name: Meghan [name] MRN: [id number] DOB:10/12/1960, 62 y.o., female Today's Date: 04/29/2022  PCP: Carney Living, MD REFERRING PROVIDER: McDiarmid, Leighton Roach, MD   END OF SESSION:  End of Session - 04/29/22 1325     Visit Number 1    Number of Visits 1    Authorization Type [employer]    SLP Start Time 1315    SLP Stop Time  1400    SLP Time Calculation (min) 45 min    Activity Tolerance Patient tolerated treatment well             Past Medical History:  Diagnosis Date   Angio-edema    Asthma    Back pain 01/13/2012   Lichen planus    Malignant hyperthermia    1988, 1987   Unspecified sinusitis (chronic) 08/12/2012   Urticaria    Past Surgical History:  Procedure Laterality Date   ADENOIDECTOMY     COLONOSCOPY WITH PROPOFOL N/A 08/02/2021   Procedure: COLONOSCOPY WITH PROPOFOL;  Surgeon: Jeani Hawking, MD;  Location: [facility];  Service: Gastroenterology;  Laterality: N/A;   ESOPHAGOGASTRODUODENOSCOPY (EGD) WITH PROPOFOL N/A 02/25/2019   Procedure: ESOPHAGOGASTRODUODENOSCOPY (EGD) WITH PROPOFOL;  Surgeon: Jeani Hawking, MD;  Location: [facility];  Service: Endoscopy;  Laterality: N/A;   ESOPHAGOGASTRODUODENOSCOPY (EGD) WITH PROPOFOL N/A 08/02/2021   Procedure: ESOPHAGOGASTRODUODENOSCOPY (EGD) WITH PROPOFOL;  Surgeon: Jeani Hawking, MD;  Location: [facility];  Service: Gastroenterology;  Laterality: N/A;   HEMOSTASIS CLIP PLACEMENT  02/25/2019   Procedure: HEMOSTASIS CLIP PLACEMENT;  Surgeon: Jeani Hawking, MD;  Location: [facility];  Service: Endoscopy;;   HEMOSTASIS CLIP PLACEMENT  08/02/2021   Procedure: HEMOSTASIS CLIP PLACEMENT;  Surgeon: Jeani Hawking, MD;  Location: [facility];  Service: Gastroenterology;;   POLYPECTOMY  02/25/2019   Procedure: POLYPECTOMY;  Surgeon: Jeani Hawking, MD;  Location: [facility];  Service: Endoscopy;;   POLYPECTOMY  08/02/2021   Procedure: POLYPECTOMY;  Surgeon:  Jeani Hawking, MD;  Location: [facility];  Service: Gastroenterology;;  EGD and COLON   TONSILLECTOMY  1987   TUBAL LIGATION  1988   UTERINE ARTERY EMBOLIZATION  2000   for fibroids    Patient Active Problem List   Diagnosis Date Noted   Acute CVA (cerebrovascular accident) (HCC) 04/17/2022   Prediabetes 04/17/2022   Thyroid nodule 04/17/2022   Foot drop, right foot 06/19/2021   Shellfish allergy 06/19/2021   Essential hypertension 06/19/2021   Allergic contact dermatitis 09/24/2020   Mild intermittent asthma without complication 09/17/2020   Angioedema of lips 09/17/2020   Abnormal Pap smear of cervix 11/10/2018   Lichen planus 10/20/2017   Seborrheic keratoses 06/11/2017   History of anaphylaxis 03/04/2017   Vitamin B 12 deficiency 01/14/2016   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   Polyp of colon, adenomatous 01/29/2007   GERD (gastroesophageal reflux disease) 01/29/2007   Osteoarthritis, multiple sites 04/09/2006    ONSET DATE: 04/17/2022   REFERRING DIAG: I63.9 (ICD-10-CM) - Acute CVA (cerebrovascular accident) (HCC)   THERAPY DIAG:  Cognitive communication deficit  Rationale for Evaluation and Treatment: Rehabilitation  SUBJECTIVE:   SUBJECTIVE STATEMENT: "I'm doing great"  Pt accompanied by: self  PERTINENT HISTORY: presented 04/17/22 with slurred speech and R facial droop. MRI of brain: "Limited imaging demonstrates small areas of cortical restricted diffusion and T2 hyperintense signal in the posterior left frontal lobe are again noted, consistent with acute to subacute infarcts". PMH: HTN, prediabetes, IDA and B12 deficiency, asthma, malignant hyperthermia post anesthesia, back pain,  and migraines    PAIN:  Are you having pain? No  FALLS: Has patient fallen in last 6 months?  No  LIVING ENVIRONMENT: Lives with: lives alone Lives in: House/apartment  PLOF:  Level of assistance: Independent with ADLs, Independent with IADLs Employment: Full-time  employment  PATIENT GOALS: pt expresses desire to ensure she is back to baseline  OBJECTIVE:   COGNITION: Overall cognitive status: Within functional limits for tasks assessed Functional deficits: pt denies any acute deficits since stroke, has returned to work and is successfully participating in all activities c/w her baseline  COGNITIVE COMMUNICATION: Following directions: WFL Auditory comprehension: WFL Verbal expression: WFL Functional communication: WFL Comments: per pt, rare paraphasia with none evidenced today  ORAL MOTOR EXAMINATION: Overall status: WFL  STANDARDIZED ASSESSMENTS: CLQT: Attention: WNL, Memory: WNL, Executive Function: WNL, Language: WNL, Visuospatial Skills: WNL, and Clock Drawing: WNL   TODAY'S TREATMENT: 04/29/22: CLQT completed.    PATIENT EDUCATION: Education details: see above Person educated: Patient Education method: Explanation Education comprehension: verbalized understanding and returned demonstration   ASSESSMENT:  CLINICAL IMPRESSION: Patient is a 62 y.o. F who was seen today for cognitive linguistic s/p stroke. Evaluation reveals WNL  cognition, speech, and language . Pt reports return to baseline with no concerns for speech, language, cognition, or swallowing at this time. Skilled ST is not indicated at this time. Education provided on need to get new referral from PCP should pt's needs change. Pt verbalizes understanding.   PLAN:  SLP FREQUENCY: one time visit   Maia Breslow, CCC-SLP 04/29/2022, 1:47 PM

## 2022-04-28 ENCOUNTER — Ambulatory Visit: Payer: Commercial Managed Care - HMO | Attending: Student | Admitting: Cardiovascular Disease

## 2022-04-28 ENCOUNTER — Encounter: Payer: Self-pay | Admitting: Cardiovascular Disease

## 2022-04-28 VITALS — BP 176/90 | HR 66 | Ht 66.0 in | Wt 190.2 lb

## 2022-04-28 DIAGNOSIS — I639 Cerebral infarction, unspecified: Secondary | ICD-10-CM

## 2022-04-28 NOTE — Progress Notes (Signed)
Electrophysiology Office Note:    Date:  04/28/2022   ID:  Meghan [name], DOB Feb 15, 1960, MRN [id number]  PCP:  Lind Covert, MD   [place] Providers Cardiologist:  None Electrophysiologist:  Melida Quitter, MD     Referring MD: Lind Covert, *   History of Present Illness:    Meghan [name] is a 62 y.o. female with a hx listed below, significant for stroke, hypertension referred for loop recorder placement.  She was admitted in March 2024 with slurred speech.  She was not a candidate for TNK.  MRI showed small acute infarcts in the high left posterior frontal lobe.  There is no vessel stenosis on CTA.  DVT ultrasound was negative.  No Doppler evidence of shunt on echocardiogram.  Today she reports that she is doing well.  No palpitations, chest pain, shortness of breath, syncope.  Past Medical History:  Diagnosis Date   Angio-edema    Asthma    Back pain [id number]   Lichen planus    Malignant hyperthermia    1988, 1987   Unspecified sinusitis (chronic) 08/12/2012   Urticaria     Past Surgical History:  Procedure Laterality Date   ADENOIDECTOMY     COLONOSCOPY WITH PROPOFOL N/A 08/02/2021   Procedure: COLONOSCOPY WITH PROPOFOL;  Surgeon: Carol Ada, MD;  Location: [facility];  Service: Gastroenterology;  Laterality: N/A;   ESOPHAGOGASTRODUODENOSCOPY (EGD) WITH PROPOFOL N/A 02/25/2019   Procedure: ESOPHAGOGASTRODUODENOSCOPY (EGD) WITH PROPOFOL;  Surgeon: Carol Ada, MD;  Location: [facility];  Service: Endoscopy;  Laterality: N/A;   ESOPHAGOGASTRODUODENOSCOPY (EGD) WITH PROPOFOL N/A 08/02/2021   Procedure: ESOPHAGOGASTRODUODENOSCOPY (EGD) WITH PROPOFOL;  Surgeon: Carol Ada, MD;  Location: [facility];  Service: Gastroenterology;  Laterality: N/A;   HEMOSTASIS CLIP PLACEMENT  02/25/2019   Procedure: HEMOSTASIS CLIP PLACEMENT;  Surgeon: Carol Ada, MD;  Location: [facility];  Service: Endoscopy;;   HEMOSTASIS CLIP  PLACEMENT  08/02/2021   Procedure: HEMOSTASIS CLIP PLACEMENT;  Surgeon: Carol Ada, MD;  Location: [facility];  Service: Gastroenterology;;   POLYPECTOMY  02/25/2019   Procedure: POLYPECTOMY;  Surgeon: Carol Ada, MD;  Location: [facility];  Service: Endoscopy;;   POLYPECTOMY  08/02/2021   Procedure: POLYPECTOMY;  Surgeon: Carol Ada, MD;  Location: [facility];  Service: Gastroenterology;;  EGD and [place]  2000   for fibroids     Current Medications: Current Meds  Medication Sig   albuterol (VENTOLIN HFA) 108 (90 Base) MCG/ACT inhaler INHALE 2 PUFFS INTO LUNGS EVERY 6 HOURS AS NEEDED FOR WHEEZING FOR SHORTNESS OF BREATH (Patient taking differently: Inhale 2 puffs into the lungs every 6 (six) hours as needed for shortness of breath.)   aspirin EC 81 MG tablet Take 1 tablet (81 mg total) by mouth daily. Swallow whole.   atorvastatin (LIPITOR) 20 MG tablet Take 1 tablet (20 mg total) by mouth daily.   cetirizine (ZYRTEC) 10 MG tablet Take 10 mg by mouth daily as needed for allergies.   clopidogrel (PLAVIX) 75 MG tablet Take 1 tablet (75 mg total) by mouth daily.   Cyanocobalamin (B-12 COMPLIANCE INJECTION) 1000 MCG/ML KIT Inject 1,000 mcg as directed every 30 (thirty) days.   EPINEPHrine 0.3 mg/0.3 mL IJ SOAJ injection Inject 0.3 mg into the muscle as needed for anaphylaxis.   ferrous sulfate 325 (65 FE) MG tablet Take 1 tablet (325 mg total) by mouth every other day.  MAGNESIUM PO Take 1 tablet by mouth 2 (two) times daily.   Multiple Vitamin (MULTIVITAMIN WITH MINERALS) TABS tablet Take 1 tablet by mouth daily.     Allergies:   Epinephrine, Shellfish allergy, Codeine, Latex, Lidocaine, Metronidazole, Morphine, Other, Oxycodone, Oxycodone-acetaminophen, and Sulfonamide derivatives   Social and Family History: Reviewed in Epic  ROS:   Please see the history of present illness.    All other systems  reviewed and are negative.  EKGs/Labs/Other Studies Reviewed Today:    Echocardiogram:  TTE 04/18/22  1. Left ventricular ejection fraction, by estimation, is 60 to 65%. The  left ventricle has normal function. The left ventricle has no regional  wall motion abnormalities. There is mild left ventricular hypertrophy.  Left ventricular diastolic parameters  were normal.   2. Right ventricular systolic function is normal. The right ventricular  size is normal. There is normal pulmonary artery systolic pressure.   3. The mitral valve is normal in structure. No evidence of mitral valve  regurgitation. No evidence of mitral stenosis.   4. The aortic valve is normal in structure. Aortic valve regurgitation is  not visualized. No aortic stenosis is present.   5. The inferior vena cava is normal in size with greater than 50%  respiratory variability, suggesting right atrial pressure of 3 mmHg.   Monitors:  Wearable monitor was ordered but not placed  Advanced imaging:   EKG:  Last EKG results: sinus rhythm   Recent Labs: 04/18/2022: ALT 16; BUN 11; Creatinine, Ser 0.82; Hemoglobin 11.6; Platelets 246; Potassium 3.7; Sodium 139; TSH 1.257     Physical Exam:    VS:  BP (!) 176/90   Pulse 66   Ht 5\' 6"  (1.676 m)   Wt 190 lb 3.2 oz (86.3 kg)   SpO2 99%   BMI 30.70 kg/m     Wt Readings from Last 3 Encounters:  04/28/22 190 lb 3.2 oz (86.3 kg)  04/17/22 180 lb (81.6 kg)  12/02/21 189 lb 12.8 oz (86.1 kg)     GEN: Well nourished, well developed in no acute distress CARDIAC: RRR, no murmurs, rubs, gallops RESPIRATORY:  Normal work of breathing MUSCULOSKELETAL: no edema    ASSESSMENT & PLAN:    Stroke - loop recorder has been requested to evaluate for atrial fibrillation as possible etiology of stroke. -I described the procedure rationale and logistics to the patient.  I explained the monitoring process including monthly charge.  She would like to proceed.  We will proceed  with precertification with her insurance and schedule the procedure. I explained possible complications such as bleeding or infection.       Medication Adjustments/Labs and Tests Ordered: Current medicines are reviewed at length with the patient today.  Concerns regarding medicines are outlined above.  Orders Placed This Encounter  Procedures   EKG 12-Lead   No orders of the defined types were placed in this encounter.    Signed, Melida Quitter, MD  04/28/2022 4:49 PM    [place]

## 2022-04-28 NOTE — Patient Instructions (Addendum)
Medication Instructions:  Your physician recommends that you continue on your current medications as directed. Please refer to the Current Medication list given to you today.  *If you need a refill on your cardiac medications before your next appointment, please call your pharmacy*  Lab Work: None ordered  Testing/Procedures: None ordered  Follow-Up: We will contact you to schedule your office visit for the loop recorder implant.    Implantable Loop Recorder Placement, Care After This sheet gives you information about how to care for yourself after your procedure. Your health care provider may also give you more specific instructions. If you have problems or questions, contact your health care provider. What can I expect after the procedure? After the procedure, it is common to have: Soreness or discomfort near the incision. Some swelling or bruising near the incision.  Follow these instructions at home: Incision care  Monitor your cardiac device site for redness, swelling, and drainage. Call the device clinic at [phone] if you experience these symptoms or fever/chills.  Keep the large square bandage on your site for 24 hours and then you may remove it yourself. Keep the steri-strips underneath in place.   You may shower after 72 hours / 3 days from your procedure with the steri-strips in place. They will usually fall off on their own, or may be removed after 10 days. Pat dry.   Avoid lotions, ointments, or perfumes over your incision until it is well-healed.  Please do not submerge in water until your site is completely healed.   Your device is MRI compatible.   Remote monitoring is used to monitor your cardiac device from home. This monitoring is scheduled every month by our office. It allows [place] to keep an eye on the function of your device to ensure it is working properly.  If your wound site starts to bleed apply pressure.    For help with the monitor please call  [employer] Monitor Support Specialist directly at [phone].    If you have any questions/concerns please call the device clinic at [phone].  Activity  Return to your normal activities.  General instructions Follow instructions from your health care provider about how to manage your implantable loop recorder and transmit the information. Learn how to activate a recording if this is necessary for your type of device. You may go through a metal detection gate, and you may let someone hold a metal detector over your chest. Show your ID card if needed. Do not have an MRI unless you check with your health care provider first. Take over-the-counter and prescription medicines only as told by your health care provider. Keep all follow-up visits as told by your health care provider. This is important. Contact a health care provider if: You have redness, swelling, or pain around your incision. You have a fever. You have pain that is not relieved by your pain medicine. You have triggered your device because of fainting (syncope) or because of a heartbeat that feels like it is racing, slow, fluttering, or skipping (palpitations). Get help right away if you have: Chest pain. Difficulty breathing. Summary After the procedure, it is common to have soreness or discomfort near the incision. Change your dressing as told by your health care provider. Follow instructions from your health care provider about how to manage your implantable loop recorder and transmit the information. Keep all follow-up visits as told by your health care provider. This is important. This information is not intended to replace advice given to you  by your health care provider. Make sure you discuss any questions you have with your health care provider. Document Released: 01/08/2015 Document Revised: 03/14/2017 Document Reviewed: 03/14/2017 Elsevier Patient Education  2020 [employer].

## 2022-04-29 ENCOUNTER — Ambulatory Visit (INDEPENDENT_AMBULATORY_CARE_PROVIDER_SITE_OTHER): Payer: Commercial Managed Care - HMO | Admitting: Family Medicine

## 2022-04-29 ENCOUNTER — Ambulatory Visit: Payer: Commercial Managed Care - HMO | Attending: Family Medicine | Admitting: Speech Pathology

## 2022-04-29 ENCOUNTER — Other Ambulatory Visit: Payer: Self-pay

## 2022-04-29 ENCOUNTER — Encounter: Payer: Self-pay | Admitting: Speech Pathology

## 2022-04-29 ENCOUNTER — Ambulatory Visit: Payer: Commercial Managed Care - HMO | Admitting: Student

## 2022-04-29 ENCOUNTER — Encounter: Payer: Self-pay | Admitting: Family Medicine

## 2022-04-29 VITALS — BP 186/80 | HR 63 | Ht 66.0 in | Wt 190.4 lb

## 2022-04-29 DIAGNOSIS — G4489 Other headache syndrome: Secondary | ICD-10-CM | POA: Diagnosis not present

## 2022-04-29 DIAGNOSIS — I10 Essential (primary) hypertension: Secondary | ICD-10-CM

## 2022-04-29 DIAGNOSIS — E041 Nontoxic single thyroid nodule: Secondary | ICD-10-CM

## 2022-04-29 DIAGNOSIS — I639 Cerebral infarction, unspecified: Secondary | ICD-10-CM | POA: Diagnosis not present

## 2022-04-29 DIAGNOSIS — E538 Deficiency of other specified B group vitamins: Secondary | ICD-10-CM

## 2022-04-29 DIAGNOSIS — R41841 Cognitive communication deficit: Secondary | ICD-10-CM | POA: Insufficient documentation

## 2022-04-29 DIAGNOSIS — R519 Headache, unspecified: Secondary | ICD-10-CM | POA: Insufficient documentation

## 2022-04-29 MED ORDER — CYANOCOBALAMIN 1000 MCG/ML IJ SOLN
1000.0000 ug | Freq: Once | INTRAMUSCULAR | Status: AC
  Administered 2022-04-29: 1000 ug via INTRAMUSCULAR

## 2022-04-29 NOTE — Assessment & Plan Note (Signed)
Seems to have nearly full recovery.  Continue prevention strategies and medications

## 2022-04-29 NOTE — Assessment & Plan Note (Signed)
Unsure of cause.  Given brief mild nature does not seem to be migraine or tension.  Recent CNS imaging reassuring.  Will monitor

## 2022-04-29 NOTE — Patient Instructions (Addendum)
Good to see you today - Thank you for coming in  Things we discussed today:  Stroke Prevention - take all your medications as prescribed - stop the Plavix in 21 days after the stroke - Your goal blood pressure is less than 140/90.  Check your blood pressure several times a week.  If regularly higher than this please let me know - either with MyChart or leaving a phone message. Next visit please bring in your blood pressure cuff.     Thyroid Nodule - we have ordered an [place]  Headaches - if worsening try tylenol - if not helping return  - If any stroke symptoms go to the ER    Please always bring your medication bottles  Come back to see one month

## 2022-04-29 NOTE — Assessment & Plan Note (Signed)
Incidental finding.  [place] ordered

## 2022-04-29 NOTE — Assessment & Plan Note (Signed)
Not at goal.  Monitor at home.  Likely needs increased medications

## 2022-04-29 NOTE — Progress Notes (Unsigned)
    SUBJECTIVE:   CHIEF COMPLAINT / HPI:   CVA From 04/18/22 DC summary "  PCP Follow-up Recommendations:  Recommended thyroid US, with 1.5 cm left thyroid nodule noted on CTA head/neck Statin added during hospitalization Aspirin and plavix for 21 days, then just aspirin.  Ensure follow-up with cardiology  " She feels completely back to normal.  No problems speaking or seeing or with balance   Hypertension Although not on her medication list is sure is taking amlodipine - "lowest dose" daily and took yesterday evening.  No history of hypertension before but does have Family history  No edema  Lipids Taking lipitor daily   PreDiabetes  A1c in March 5.6   Headache Has mild intermittent headaches that come and go.  No visual changes or scalp tenderness or weakness or vomiting    OBJECTIVE:   BP (!) 186/80   Pulse 63   Ht 5\' 6"  (1.676 m)   Wt 190 lb 6.4 oz (86.4 kg)   SpO2 100%   BMI 30.73 kg/m   Heart - Regular rate and rhythm.  No murmurs, gallops or rubs.    Lungs:  Normal respiratory effort, chest expands symmetrically. Lungs are clear to auscultation, no crackles or wheezes. Neurologic exam : Cn 2-7 intact Strength equal & normal in upper & lower extremities Able to walk on heels and toes.   Balance normal  Romberg normal, finger to nose normal Eye - Pupils Equal Round Reactive to light, Extraocular movements intact, Conjunctiva without redness or discharge Normal speech  No temporal artery tenderness   ASSESSMENT/PLAN:   Thyroid nodule Assessment & Plan: Incidental finding.  [place] ordered  Orders: -     US THYROID; Future  Vitamin B 12 deficiency -     Cyanocobalamin  Essential hypertension Assessment & Plan: Not at goal.  Monitor at home.  Likely needs increased medications    Acute CVA (cerebrovascular accident) [facility]) Assessment & Plan: Seems to have nearly full recovery.  Continue prevention strategies and medications       Patient  Instructions  Good to see you today - Thank you for coming in  Things we discussed today:  Stroke Prevention - take all your medications as prescribed - stop the Plavix in 21 days after the stroke - Your goal blood pressure is less than 140/90.  Check your blood pressure several times a week.  If regularly higher than this please let me know - either with MyChart or leaving a phone message. Next visit please bring in your blood pressure cuff.     Thyroid Nodule - we have ordered an [place]  Headaches - if worsening try tylenol - if not helping return  - If any stroke symptoms go to the ER    Please always bring your medication bottles  Come back to see one month     Lind Covert, [place]

## 2022-04-30 ENCOUNTER — Telehealth: Payer: Self-pay | Admitting: Cardiovascular Disease

## 2022-04-30 MED ORDER — 3 SERIES BP MONITOR/UPPER ARM DEVI: 0 refills | Status: AC

## 2022-04-30 NOTE — Telephone Encounter (Signed)
Patient states that she would like a call back from [place], [place] again. She states she would like some clarity on upcoming procedure.

## 2022-05-01 NOTE — Telephone Encounter (Signed)
This patient has not seen [facility] yet but has seen Dr. Myles Gip and is set to have a LINQ implant.

## 2022-05-02 NOTE — Telephone Encounter (Signed)
Returned call to patient.  Discussed loop implant procedure, consult with Dr. Ali Lowe for PFO, follow-up with general cardiology which can be with Dr. Ali Lowe or can be referred to another provider at consult visit on 06/02/22.  Patient verbalized understanding and expressed appreciation for call.

## 2022-05-08 ENCOUNTER — Ambulatory Visit (HOSPITAL_COMMUNITY)
Admission: RE | Admit: 2022-05-08 | Discharge: 2022-05-08 | Disposition: A | Discharge status: Home / Self Care | Payer: Commercial Managed Care - HMO | Source: Ambulatory Visit | Attending: Family Medicine | Admitting: Family Medicine

## 2022-05-08 DIAGNOSIS — E041 Nontoxic single thyroid nodule: Secondary | ICD-10-CM | POA: Diagnosis not present

## 2022-05-12 ENCOUNTER — Telehealth: Payer: Self-pay

## 2022-05-12 NOTE — Telephone Encounter (Signed)
Patient calls nurse line for two reasons.  She wants to verify that she no longer needs to take Plavix. She finished Plavix on either 3/26 or 3/27. She has been taking only aspirin since then. Per last note, it appears that patient was to only take Plavix for 21 days. Will reach out to PCP to double check.  Patient would also like to discuss results of recent thyroid ultrasound.    Please call patient at [phone].  Talbot Grumbling, RN

## 2022-05-12 NOTE — Progress Notes (Unsigned)
[facility] [address] [place]. [place] [address] [phone]       HOSPITAL FOLLOW UP NOTE  Ms. Meghan [name] Date of Birth:  Jul 29, 1960 Medical Record Number:  [id number]   Reason for Referral:  hospital stroke follow up    SUBJECTIVE:   CHIEF COMPLAINT:  No chief complaint on file.   HPI:   Ms. Meghan [name] is a 62 y.o. female with history of asthma, malignant hyperthermia post anesthesia, back pain and migraines who presented on 04/17/2022 for evaluation of new complaints of slurred speech and "feeling like my mouth is twisted". She described numbness in her right face that traveled to her right arm.  Stroke workup revealed small left frontal cortical acute infarcts of unclear etiology, possibly PFO related vs occult A-fib.  CTA head/neck negative LVO.  EF 60 to 65%.  LE Doppler negative for DVT.  TCD bubble positive for small PFO, ROPE score 5, recommended outpatient TEE and loop recorder placement and if no A-fib for 2 to 3 months, recommended referral to structural cardiology for further evaluation of PFO.  LDL 89.  A1c 5.8.  Recommended DAPT for 3 weeks and aspirin alone and atorvastatin 20 mg daily.  No therapy needs identified and discharged home.   Today, 05/13/2022, patient is being seen for initial hospital follow-up.      PERTINENT IMAGING  Per hospitalization 04/17/2022 -04/18/2022 Code Stroke CT head No acute abnormality. ASPECTS 10.    CTA head & neck No LVO MRI and repeat MRI showed Small acute infarcts in the high left posterior frontal lobe 2D Echo EF 60 to 65% TCD Bubble Study- Positive for small PFO Lower extremity venous no DVT  LDL 89 HgbA1c 5.8    ROS:   14 system review of systems performed and negative with exception of ***  PMH:  Past Medical History:  Diagnosis Date   Angio-edema    Asthma    Back pain [id number]   Lichen planus    Malignant hyperthermia    1988, 1987   Unspecified sinusitis (chronic)  08/12/2012   Urticaria     PSH:  Past Surgical History:  Procedure Laterality Date   ADENOIDECTOMY     COLONOSCOPY WITH PROPOFOL N/A 08/02/2021   Procedure: COLONOSCOPY WITH PROPOFOL;  Surgeon: Carol Ada, MD;  Location: [facility];  Service: Gastroenterology;  Laterality: N/A;   ESOPHAGOGASTRODUODENOSCOPY (EGD) WITH PROPOFOL N/A 02/25/2019   Procedure: ESOPHAGOGASTRODUODENOSCOPY (EGD) WITH PROPOFOL;  Surgeon: Carol Ada, MD;  Location: [facility];  Service: Endoscopy;  Laterality: N/A;   ESOPHAGOGASTRODUODENOSCOPY (EGD) WITH PROPOFOL N/A 08/02/2021   Procedure: ESOPHAGOGASTRODUODENOSCOPY (EGD) WITH PROPOFOL;  Surgeon: Carol Ada, MD;  Location: [facility];  Service: Gastroenterology;  Laterality: N/A;   HEMOSTASIS CLIP PLACEMENT  02/25/2019   Procedure: HEMOSTASIS CLIP PLACEMENT;  Surgeon: Carol Ada, MD;  Location: [facility];  Service: Endoscopy;;   HEMOSTASIS CLIP PLACEMENT  08/02/2021   Procedure: HEMOSTASIS CLIP PLACEMENT;  Surgeon: Carol Ada, MD;  Location: [facility];  Service: Gastroenterology;;   POLYPECTOMY  02/25/2019   Procedure: POLYPECTOMY;  Surgeon: Carol Ada, MD;  Location: [facility];  Service: Endoscopy;;   POLYPECTOMY  08/02/2021   Procedure: POLYPECTOMY;  Surgeon: Carol Ada, MD;  Location: [facility];  Service: Gastroenterology;;  EGD and [employer]   UTERINE ARTERY EMBOLIZATION  2000   for fibroids     Social History:  Social History   Socioeconomic History  Marital status: Divorced    Spouse name: Not on file   Number of children: Not on file   Years of education: Not on file   Highest education level: Not on file  Occupational History   Not on file  Tobacco Use   Smoking status: Former    Types: Cigarettes    Quit date: 09/13/1995    Years since quitting: 26.6   Smokeless tobacco: Never  Vaping Use   Vaping Use: Never used  Substance and Sexual Activity   Alcohol use: Yes     Alcohol/week: 0.5 standard drinks of alcohol    Types: 1 drink(s) per week   Drug use: No   Sexual activity: Not Currently  Other Topics Concern   Not on file  Social History Narrative   Not on file   Social Determinants of Health   Financial Resource Strain: Not on file  Food Insecurity: Not on file  Transportation Needs: Not on file  Physical Activity: Not on file  Stress: Not on file  Social Connections: Not on file  Intimate Partner Violence: Not on file    Family History:  Family History  Problem Relation Age of Onset   Hypertension Mother    Kidney disease Mother    Cholecystitis Mother    Hypertension Father    Diabetes Father    Kidney disease Daughter     Medications:   Current Outpatient Medications on File Prior to Visit  Medication Sig Dispense Refill   albuterol (VENTOLIN HFA) 108 (90 Base) MCG/ACT inhaler INHALE 2 PUFFS INTO LUNGS EVERY 6 HOURS AS NEEDED FOR WHEEZING FOR SHORTNESS OF BREATH (Patient taking differently: Inhale 2 puffs into the lungs every 6 (six) hours as needed for shortness of breath.) 18 g 0   amLODipine (NORVASC) 5 MG tablet Take 5 mg by mouth daily.     aspirin EC 81 MG tablet Take 1 tablet (81 mg total) by mouth daily. Swallow whole. 30 tablet 3   atorvastatin (LIPITOR) 20 MG tablet Take 1 tablet (20 mg total) by mouth daily. 90 tablet 0   Blood Pressure Monitoring (3 SERIES BP MONITOR/UPPER ARM) DEVI Use as directed 1 each 0   cetirizine (ZYRTEC) 10 MG tablet Take 10 mg by mouth daily as needed for allergies.     clopidogrel (PLAVIX) 75 MG tablet Take 1 tablet (75 mg total) by mouth daily. 19 tablet 0   Cyanocobalamin (B-12 COMPLIANCE INJECTION) 1000 MCG/ML KIT Inject 1,000 mcg as directed every 30 (thirty) days.     EPINEPHrine 0.3 mg/0.3 mL IJ SOAJ injection Inject 0.3 mg into the muscle as needed for anaphylaxis. 2 each 1   ferrous sulfate 325 (65 FE) MG tablet Take 1 tablet (325 mg total) by mouth every other day. (Patient not  taking: Reported on 04/29/2022)     MAGNESIUM PO Take 1 tablet by mouth 2 (two) times daily.     Multiple Vitamin (MULTIVITAMIN WITH MINERALS) TABS tablet Take 1 tablet by mouth daily.     No current facility-administered medications on file prior to visit.    Allergies:   Allergies  Allergen Reactions   Epinephrine Other (See Comments)    Shuts body down   Shellfish Allergy Itching and Swelling   Codeine Hives and Itching   Latex Swelling and Hives    Per allergist   Lidocaine Swelling    Disorientation, shocks system  OK to use mepivacaine     Metronidazole Hives and Itching   Morphine  Shuts system down    Other     malignant hyperthermia   Oxycodone     Shut system down    Oxycodone-Acetaminophen     Shuts system down   Sulfonamide Derivatives Hives and Itching      OBJECTIVE:  Physical Exam  There were no vitals filed for this visit. There is no height or weight on file to calculate BMI. No results found.     04/29/2022   11:11 AM  Depression screen PHQ 2/9  Decreased Interest 0  Down, Depressed, Hopeless 0  PHQ - 2 Score 0  Altered sleeping 0  Tired, decreased energy 2  Change in appetite 0  Feeling bad or failure about yourself  0  Trouble concentrating 0  Moving slowly or fidgety/restless 0  Suicidal thoughts 0  PHQ-9 Score 2  Difficult doing work/chores Not difficult at all     General: well developed, well nourished, seated, in no evident distress Head: head normocephalic and atraumatic.   Neck: supple with no carotid or supraclavicular bruits Cardiovascular: regular rate and rhythm, no murmurs Musculoskeletal: no deformity Skin:  no rash/petichiae Vascular:  Normal pulses all extremities   Neurologic Exam Mental Status: Awake and fully alert. Oriented to place and time. Recent and remote memory intact. Attention span, concentration and fund of knowledge appropriate. Mood and affect appropriate.  Cranial Nerves: Fundoscopic exam reveals  sharp disc margins. Pupils equal, briskly reactive to light. Extraocular movements full without nystagmus. Visual fields full to confrontation. Hearing intact. Facial sensation intact. Face, tongue, palate moves normally and symmetrically.  Motor: Normal bulk and tone. Normal strength in all tested extremity muscles Sensory.: intact to touch , pinprick , position and vibratory sensation.  Coordination: Rapid alternating movements normal in all extremities. Finger-to-nose and heel-to-shin performed accurately bilaterally. Gait and Station: Arises from chair without difficulty. Stance is normal. Gait demonstrates normal stride length and balance with ***. Tandem walk and heel toe ***.  Reflexes: 1+ and symmetric. Toes downgoing.     NIHSS  *** Modified Rankin  ***      ASSESSMENT: Meghan [name] is a 62 y.o. year old female with small left frontal cortical acute infarcts on 04/17/2022 of unclear etiology, possibly PFO related vs occult A-fib. Vascular risk factors include PFO, migraine headaches, HTN, and HLD.      PLAN:  Left frontal cortical infarcts:  Residual deficit: ***.  Continue aspirin 81mg  daily and atorvastatin (Lipitor) for secondary stroke prevention.   Discussed secondary stroke prevention measures and importance of close PCP follow up for aggressive stroke risk factor management including BP goal<130/90, HLD with LDL goal<70 and DM with A1c.<7 .  Stroke labs 04/2022: LDL 89, A1c 5.8 I have gone over the pathophysiology of stroke, warning signs and symptoms, risk factors and their management in some detail with instructions to go to the closest emergency room for symptoms of concern.     Follow up in *** or call earlier if needed   CC:  [facility] provider: Dr. Leonie Man PCP: Lind Covert, MD    I spent *** minutes of face-to-face and non-face-to-face time with patient.  This included previsit chart review including review of recent hospitalization, lab review, study  review, order entry, electronic health record documentation, patient education regarding recent stroke including etiology, secondary stroke prevention measures and importance of managing stroke risk factors, residual deficits and typical recovery time and answered all other questions to patient satisfaction   Frann Rider, AGNP-BC  [facility]  [address] [place], [place] [address]  Phone [phone] Fax [phone] Note: This document was prepared with digital dictation and possible smart phrase technology. Any transcriptional errors that result from this process are unintentional.

## 2022-05-13 ENCOUNTER — Encounter: Payer: Self-pay | Admitting: Adult Health

## 2022-05-13 ENCOUNTER — Ambulatory Visit (INDEPENDENT_AMBULATORY_CARE_PROVIDER_SITE_OTHER): Payer: Commercial Managed Care - HMO | Admitting: Adult Health

## 2022-05-13 VITALS — BP 135/86 | HR 65 | Ht 66.0 in | Wt 188.4 lb

## 2022-05-13 DIAGNOSIS — Z9189 Other specified personal risk factors, not elsewhere classified: Secondary | ICD-10-CM | POA: Diagnosis not present

## 2022-05-13 DIAGNOSIS — I639 Cerebral infarction, unspecified: Secondary | ICD-10-CM

## 2022-05-13 NOTE — Patient Instructions (Addendum)
Follow-up with cardiology as scheduled for loop recorder placement  Referral will be placed to our sleep clinic to evaluate for possible sleep apnea  Continue aspirin 81 mg daily  and atorvastatin for secondary stroke prevention, refills will be obtained by PCP  Continue to follow up with PCP regarding blood pressure and cholesterol management  Maintain strict control of hypertension with blood pressure goal below 130/90 and cholesterol with LDL cholesterol (bad cholesterol) goal below 70 mg/dL.   Signs of a Stroke? Follow the BEFAST method:  Balance Watch for a sudden loss of balance, trouble with coordination or vertigo Eyes Is there a sudden loss of vision in one or both eyes? Or double vision?  Face: Ask the person to smile. Does one side of the face droop or is it numb?  Arms: Ask the person to raise both arms. Does one arm drift downward? Is there weakness or numbness of a leg? Speech: Ask the person to repeat a simple phrase. Does the speech sound slurred/strange? Is the person confused ? Time: If you observe any of these signs, call 911.       Thank you for coming to see [place] at [facility]. I hope we have been able to provide you high quality care today.  You may receive a patient satisfaction survey over the next few weeks. We would appreciate your feedback and comments so that we may continue to improve ourselves and the health of our patients.   Stroke Prevention Some medical conditions and lifestyle choices can lead to a higher risk for a stroke. You can help to prevent a stroke by eating healthy foods and exercising. It also helps to not smoke and to manage any health problems you may have. How can this condition affect me? A stroke is an emergency. It should be treated right away. A stroke can lead to brain damage or threaten your life. There is a better chance of surviving and getting better after a stroke if you get medical help right away. What can  increase my risk? The following medical conditions may increase your risk of a stroke: Diseases of the heart and blood vessels (cardiovascular disease). High blood pressure (hypertension). Diabetes. High cholesterol. Sickle cell disease. Problems with blood clotting. Being very overweight. Sleeping problems (obstructivesleep apnea). Other risk factors include: Being older than age 63. A history of blood clots, stroke, or mini-stroke (TIA). Race, ethnic background, or a family history of stroke. Smoking or using tobacco products. Taking birth control pills, especially if you smoke. Heavy alcohol and drug use. Not being active. What actions can I take to prevent this? Manage your health conditions High cholesterol. Eat a healthy diet. If this is not enough to manage your cholesterol, you may need to take medicines. Take medicines as told by your doctor. High blood pressure. Try to keep your blood pressure below 130/80. If your blood pressure cannot be managed through a healthy diet and regular exercise, you may need to take medicines. Take medicines as told by your doctor. Ask your doctor if you should check your blood pressure at home. Have your blood pressure checked every year. Diabetes. Eat a healthy diet and get regular exercise. If your blood sugar (glucose) cannot be managed through diet and exercise, you may need to take medicines. Take medicines as told by your doctor. Talk to your doctor about getting checked for sleeping problems. Signs of a problem can include: Snoring a lot. Feeling very tired. Make sure that you manage  any other conditions you have. Nutrition  Follow instructions from your doctor about what to eat or drink. You may be told to: Eat and drink fewer calories each day. Limit how much salt (sodium) you use to 1,500 milligrams (mg) each day. Use only healthy fats for cooking, such as olive oil, canola oil, and sunflower oil. Eat healthy foods. To do  this: Choose foods that are high in fiber. These include whole grains, and fresh fruits and vegetables. Eat at least 5 servings of fruits and vegetables a day. Try to fill one-half of your plate with fruits and vegetables at each meal. Choose low-fat (lean) proteins. These include low-fat cuts of meat, chicken without skin, fish, tofu, beans, and nuts. Eat low-fat dairy products. Avoid foods that: Are high in salt. Have saturated fat. Have trans fat. Have cholesterol. Are processed or pre-made. Count how many carbohydrates you eat and drink each day. Lifestyle If you drink alcohol: Limit how much you have to: 0-1 drink a day for women who are not pregnant. 0-2 drinks a day for men. Know how much alcohol is in your drink. In the [place] one drink equals one 12 oz bottle of beer (317mL), one 5 oz glass of wine (142mL), or one 1 oz glass of hard liquor (18mL). Do not smoke or use any products that have nicotine or tobacco. If you need help quitting, ask your doctor. Avoid secondhand smoke. Do not use drugs. Activity  Try to stay at a healthy weight. Get at least 30 minutes of exercise on most days, such as: Fast walking. Biking. Swimming. Medicines Take over-the-counter and prescription medicines only as told by your doctor. Avoid taking birth control pills. Talk to your doctor about the risks of taking birth control pills if: You are over 58 years old. You smoke. You get very bad headaches. You have had a blood clot. Where to find more information American Stroke Association: www.strokeassociation.org Get help right away if: You or a loved one has any signs of a stroke. "BE FAST" is an easy way to remember the warning signs: B - Balance. Dizziness, sudden trouble walking, or loss of balance. E - Eyes. Trouble seeing or a change in how you see. F - Face. Sudden weakness or loss of feeling of the face. The face or eyelid may droop on one side. A - Arms. Weakness or loss of  feeling in an arm. This happens all of a sudden and most often on one side of the body. S - Speech. Sudden trouble speaking, slurred speech, or trouble understanding what people say. T - Time. Time to call emergency services. Write down what time symptoms started. You or a loved one has other signs of a stroke, such as: A sudden, very bad headache with no known cause. Feeling like you may vomit (nausea). Vomiting. A seizure. These symptoms may be an emergency. Get help right away. Call your local emergency services (911 in the [place] Do not wait to see if the symptoms will go away. Do not drive yourself to the hospital. Summary You can help to prevent a stroke by eating healthy, exercising, and not smoking. It also helps to manage any health problems you have. Do not smoke or use any products that contain nicotine or tobacco. Get help right away if you or a loved one has any signs of a stroke. This information is not intended to replace advice given to you by your health care provider. Make sure you discuss any questions  you have with your health care provider. Document Revised: 08/11/2019 Document Reviewed: 08/29/2019 Elsevier Patient Education  [employer].     Sleep Apnea Sleep apnea affects breathing during sleep. It causes breathing to stop for 10 seconds or more, or to become shallow. People with sleep apnea usually snore loudly. It can also increase the risk of: Heart attack. Stroke. Being very overweight (obese). Diabetes. Heart failure. Irregular heartbeat. High blood pressure. The goal of treatment is to help you breathe normally again. What are the causes?  The most common cause of this condition is a collapsed or blocked airway. There are three kinds of sleep apnea: Obstructive sleep apnea. This is caused by a blocked or collapsed airway. Central sleep apnea. This happens when the brain does not send the right signals to the muscles that control  breathing. Mixed sleep apnea. This is a combination of obstructive and central sleep apnea. What increases the risk? Being overweight. Smoking. Having a small airway. Being older. Being female. Drinking alcohol. Taking medicines to calm yourself (sedatives or tranquilizers). Having family members with the condition. Having a tongue or tonsils that are larger than normal. What are the signs or symptoms? Trouble staying asleep. Loud snoring. Headaches in the morning. Waking up gasping. Dry mouth or sore throat in the morning. Being sleepy or tired during the day. If you are sleepy or tired during the day, you may also: Not be able to focus your mind (concentrate). Forget things. Get angry a lot and have mood swings. Feel sad (depressed). Have changes in your personality. Have less interest in sex, if you are female. Be unable to have an erection, if you are female. How is this treated?  Sleeping on your side. Using a medicine to get rid of mucus in your nose (decongestant). Avoiding the use of alcohol, medicines to help you relax, or certain pain medicines (narcotics). Losing weight, if needed. Changing your diet. Quitting smoking. Using a machine to open your airway while you sleep, such as: An oral appliance. This is a mouthpiece that shifts your lower jaw forward. A CPAP device. This device blows air through a mask when you breathe out (exhale). An EPAP device. This has valves that you put in each nostril. A BIPAP device. This device blows air through a mask when you breathe in (inhale) and breathe out. Having surgery if other treatments do not work. Follow these instructions at home: Lifestyle Make changes that your doctor recommends. Eat a healthy diet. Lose weight if needed. Avoid alcohol, medicines to help you relax, and some pain medicines. Do not smoke or use any products that contain nicotine or tobacco. If you need help quitting, ask your doctor. General  instructions Take over-the-counter and prescription medicines only as told by your doctor. If you were given a machine to use while you sleep, use it only as told by your doctor. If you are having surgery, make sure to tell your doctor you have sleep apnea. You may need to bring your device with you. Keep all follow-up visits. Contact a doctor if: The machine that you were given to use during sleep bothers you or does not seem to be working. You do not get better. You get worse. Get help right away if: Your chest hurts. You have trouble breathing in enough air. You have an uncomfortable feeling in your back, arms, or stomach. You have trouble talking. One side of your body feels weak. A part of your face is hanging down. These  symptoms may be an emergency. Get help right away. Call your local emergency services (911 in the [place] Do not wait to see if the symptoms will go away. Do not drive yourself to the hospital. Summary This condition affects breathing during sleep. The most common cause is a collapsed or blocked airway. The goal of treatment is to help you breathe normally while you sleep. This information is not intended to replace advice given to you by your health care provider. Make sure you discuss any questions you have with your health care provider. Document Revised: 09/05/2020 Document Reviewed: 01/06/2020 Elsevier Patient Education  [place].

## 2022-05-14 ENCOUNTER — Ambulatory Visit: Payer: Commercial Managed Care - HMO | Admitting: Rehabilitative and Restorative Service Providers"

## 2022-05-14 ENCOUNTER — Ambulatory Visit: Payer: Commercial Managed Care - HMO | Admitting: Speech Pathology

## 2022-05-20 NOTE — Progress Notes (Signed)
I agree with the above plan 

## 2022-05-23 ENCOUNTER — Ambulatory Visit: Payer: Commercial Managed Care - HMO | Admitting: Cardiovascular Disease

## 2022-05-23 NOTE — Progress Notes (Deleted)
Electrophysiology Office Note:    Date:  05/23/2022   ID:  Meghan [name], DOB 08/08/1960, MRN [id number]  PCP:  Carney Living, MD   [facility] Providers Cardiologist:  None Electrophysiologist:  Maurice Small, MD     Referring MD: Carney Living, *   History of Present Illness:    Meghan [name] is a 62 y.o. female with a hx listed below, significant for stroke, hypertension referred for loop recorder placement.  She was admitted in March 2024 with slurred speech.  She was not a candidate for TNK.  MRI showed small acute infarcts in the high left posterior frontal lobe.  There is no vessel stenosis on CTA.  DVT ultrasound was negative.  No Doppler evidence of shunt on echocardiogram.  Today she reports that she is doing well.  No palpitations, chest pain, shortness of breath, syncope.  Past Medical History:  Diagnosis Date   Angio-edema    Asthma    Back pain 01/13/2012   Lichen planus    Malignant hyperthermia    1988, 1987   Unspecified sinusitis (chronic) 08/12/2012   Urticaria     Past Surgical History:  Procedure Laterality Date   ADENOIDECTOMY     COLONOSCOPY WITH PROPOFOL N/A 08/02/2021   Procedure: COLONOSCOPY WITH PROPOFOL;  Surgeon: Jeani Hawking, MD;  Location: [facility];  Service: Gastroenterology;  Laterality: N/A;   ESOPHAGOGASTRODUODENOSCOPY (EGD) WITH PROPOFOL N/A 02/25/2019   Procedure: ESOPHAGOGASTRODUODENOSCOPY (EGD) WITH PROPOFOL;  Surgeon: Jeani Hawking, MD;  Location: [facility];  Service: Endoscopy;  Laterality: N/A;   ESOPHAGOGASTRODUODENOSCOPY (EGD) WITH PROPOFOL N/A 08/02/2021   Procedure: ESOPHAGOGASTRODUODENOSCOPY (EGD) WITH PROPOFOL;  Surgeon: Jeani Hawking, MD;  Location: [facility];  Service: Gastroenterology;  Laterality: N/A;   HEMOSTASIS CLIP PLACEMENT  02/25/2019   Procedure: HEMOSTASIS CLIP PLACEMENT;  Surgeon: Jeani Hawking, MD;  Location: [facility];  Service: Endoscopy;;   HEMOSTASIS CLIP  PLACEMENT  08/02/2021   Procedure: HEMOSTASIS CLIP PLACEMENT;  Surgeon: Jeani Hawking, MD;  Location: [facility];  Service: Gastroenterology;;   POLYPECTOMY  02/25/2019   Procedure: POLYPECTOMY;  Surgeon: Jeani Hawking, MD;  Location: [facility];  Service: Endoscopy;;   POLYPECTOMY  08/02/2021   Procedure: POLYPECTOMY;  Surgeon: Jeani Hawking, MD;  Location: [facility];  Service: Gastroenterology;;  EGD and COLON   TONSILLECTOMY  1987   TUBAL LIGATION  1988   UTERINE ARTERY EMBOLIZATION  2000   for fibroids     Current Medications: No outpatient medications have been marked as taking for the 05/23/22 encounter (Appointment) with Hannan Tetzlaff, Roberts Gaudy, MD.     Allergies:   Epinephrine, Shellfish allergy, Codeine, Latex, Lidocaine, Metronidazole, Morphine, Other, Oxycodone, Oxycodone-acetaminophen, and Sulfonamide derivatives   Social and Family History: Reviewed in Epic  ROS:   Please see the history of present illness.    All other systems reviewed and are negative.  EKGs/Labs/Other Studies Reviewed Today:    Echocardiogram:  TTE 04/18/22  1. Left ventricular ejection fraction, by estimation, is 60 to 65%. The  left ventricle has normal function. The left ventricle has no regional  wall motion abnormalities. There is mild left ventricular hypertrophy.  Left ventricular diastolic parameters  were normal.   2. Right ventricular systolic function is normal. The right ventricular  size is normal. There is normal pulmonary artery systolic pressure.   3. The mitral valve is normal in structure. No evidence of mitral valve  regurgitation. No evidence of mitral stenosis.   4. The aortic valve  is normal in structure. Aortic valve regurgitation is  not visualized. No aortic stenosis is present.   5. The inferior vena cava is normal in size with greater than 50%  respiratory variability, suggesting right atrial pressure of 3 mmHg.   Monitors:  Wearable monitor was ordered but not  placed  Advanced imaging:   EKG:  Last EKG results: sinus rhythm   Recent Labs: 04/18/2022: ALT 16; BUN 11; Creatinine, Ser 0.82; Hemoglobin 11.6; Platelets 246; Potassium 3.7; Sodium 139; TSH 1.257     Physical Exam:    VS:  There were no vitals taken for this visit.    Wt Readings from Last 3 Encounters:  05/13/22 188 lb 6.4 oz (85.5 kg)  04/29/22 190 lb 6.4 oz (86.4 kg)  04/28/22 190 lb 3.2 oz (86.3 kg)     GEN: Well nourished, well developed in no acute distress CARDIAC: RRR, no murmurs, rubs, gallops RESPIRATORY:  Normal work of breathing MUSCULOSKELETAL: no edema    ASSESSMENT & PLAN:    Stroke - loop recorder has been requested to evaluate for atrial fibrillation as possible etiology of stroke. -I described the procedure rationale and logistics to the patient.  I explained the monitoring process including monthly charge.  She would like to proceed.  We will proceed with precertification with her insurance and schedule the procedure. I explained possible complications such as bleeding or infection.       Medication Adjustments/Labs and Tests Ordered: Current medicines are reviewed at length with the patient today.  Concerns regarding medicines are outlined above.  No orders of the defined types were placed in this encounter.  No orders of the defined types were placed in this encounter.    Signed, Maurice Small, MD  05/23/2022 8:26 AM    [facility]

## 2022-05-28 ENCOUNTER — Ambulatory Visit: Payer: Commercial Managed Care - HMO | Admitting: Internal Medicine

## 2022-05-29 ENCOUNTER — Telehealth: Payer: Self-pay | Admitting: Cardiovascular Disease

## 2022-05-29 NOTE — Telephone Encounter (Signed)
Spoke with the patient at length. So far, it sounds as though her ILR has been denied.  The plan was to have ILR for 2-3 months to rule out atrial fibrillation prior to PFO consult with Dr. [name]. The patient also understands a TEE will need to be completed to confirm presence of PFO, and Dr. [name] will arrange that at her visit. She understands that if her ILR is completely denied, there are other options for monitoring her heart rhythm.  Per patient request, rescheduled her to visit with Dr. [name] 5/6. She hopes for answers about the ILR by that time.

## 2022-05-29 NOTE — Telephone Encounter (Signed)
Returned call to patient and discussed upcoming appt with Dr. Thukkani.  Patient states it wasLynnette Caffeyunderstanding that Dr. Lynnette [name] wanted some information from the loop recorder prior to seeing patient for PFO consult.  Patient was unable to have ILR implant placed in March due to insurance denial.  Will forward to Karsten Fells, RN to see if patient needs to reschedule consult with Dr. Lynnette [name] until ILR implant can be placed (if it can), and will forward to Dr. Morrie Sheldon nurse and pre-cert to address insurance denial for loop procedure.  Patient verbalized understanding and requests callback before the weekend regarding appt with Dr. Lynnette [name] on 06/02/22.

## 2022-05-29 NOTE — Telephone Encounter (Signed)
Pt states that she was to get a Loop recorder but it was cancelled. Pt would like a callback regarding whether she should keep upcoming appt with Dr. [name] being that she didn't get the Loop recorder. As well as the next step regarding loop recorder being that she has not heard anything. Please advise

## 2022-06-02 ENCOUNTER — Ambulatory Visit: Payer: Commercial Managed Care - HMO | Admitting: Internal Medicine

## 2022-06-05 NOTE — Telephone Encounter (Signed)
Late entry from 06/04/22 at 1530:  Dr. Morrie Sheldon nurse contacted via secure chat. Requested that she follow up about ILR coverage as the patient would like an update.

## 2022-06-09 ENCOUNTER — Ambulatory Visit (INDEPENDENT_AMBULATORY_CARE_PROVIDER_SITE_OTHER): Payer: Commercial Managed Care - HMO

## 2022-06-09 DIAGNOSIS — E538 Deficiency of other specified B group vitamins: Secondary | ICD-10-CM

## 2022-06-09 MED ORDER — CYANOCOBALAMIN 1000 MCG/ML IJ SOLN
1000.0000 ug | Freq: Once | INTRAMUSCULAR | Status: AC
  Administered 2022-06-09: 1000 ug via INTRAMUSCULAR

## 2022-06-09 NOTE — Progress Notes (Signed)
Pt is here for a b12 injection today. Received verbal order from Dr. Deirdre Priest for patient to receive injection today and standing order for the next 3 months.   Date of last office visit that b12 was discussed 04/29/2022  Last injection was 04/29/2022  Injection given in LD, pt has follow up appointment with PCP on 07/08/22.   Veronda Prude, RN

## 2022-06-16 ENCOUNTER — Ambulatory Visit: Payer: Commercial Managed Care - HMO | Attending: Internal Medicine | Admitting: Internal Medicine

## 2022-06-16 ENCOUNTER — Other Ambulatory Visit: Payer: Self-pay | Admitting: Internal Medicine

## 2022-06-16 ENCOUNTER — Encounter: Payer: Self-pay | Admitting: Internal Medicine

## 2022-06-16 VITALS — BP 132/80 | HR 71 | Ht 66.0 in | Wt 191.4 lb

## 2022-06-16 DIAGNOSIS — I10 Essential (primary) hypertension: Secondary | ICD-10-CM | POA: Diagnosis not present

## 2022-06-16 DIAGNOSIS — E785 Hyperlipidemia, unspecified: Secondary | ICD-10-CM | POA: Diagnosis not present

## 2022-06-16 DIAGNOSIS — I639 Cerebral infarction, unspecified: Secondary | ICD-10-CM

## 2022-06-16 DIAGNOSIS — R42 Dizziness and giddiness: Secondary | ICD-10-CM

## 2022-06-16 DIAGNOSIS — I498 Other specified cardiac arrhythmias: Secondary | ICD-10-CM

## 2022-06-16 NOTE — Progress Notes (Signed)
Cardiology Office Note:    Date:  06/16/2022   ID:  Meghan [name], DOB Oct 24, 1960, MRN [id number]  PCP:  Meghan Living, MD   [facility] Providers Cardiologist:  Meghan Skeans, MD Referring MD: Meghan [name], *   Chief Complaint/Reason for Referral: PFO in the context of stroke  ASSESSMENT:    1. Acute CVA (cerebrovascular accident) (HCC)   2. Essential hypertension   3. Hyperlipidemia LDL goal <70     PLAN:    In order of problems listed above: 1.  Stroke: The patient has an intermediate rope score.  Her transcranial Doppler suggest the PFO.  I will obtain a 30-day monitor.  I will also obtain a limited echocardiogram with bubble study.  I will have her come back to see me in 3 to 4 months.  Even if she does have a PFO I think there is a high threshold to close it given her advanced age and cardiovascular risk factors.  If her bubble study is positive and she harbors no atrial fibrillation then we would move onto a TEE.  If she has high risk anatomy then this may suggest the need for closure.  Additionally we could consider longer-term monitoring with an implantable Linq monitor as well.  Once I have more data I will discuss with neurology. 2.  Hypertension: Her blood pressure is well-controlled on her current regimen. 3.  Hyperlipidemia: Continue statin.             Dispo:  No follow-ups on file.      Medication Adjustments/Labs and Tests Ordered: Current medicines are reviewed at length with the patient today.  Concerns regarding medicines are outlined above.  The following changes have been made:  no change   Labs/tests ordered: Orders Placed This Encounter  Procedures   CARDIAC EVENT MONITOR   ECHOCARDIOGRAM LIMITED BUBBLE STUDY    Medication Changes: No orders of the defined types were placed in this encounter.    Current medicines are reviewed at length with the patient today.  The patient does not have concerns regarding  medicines.   History of Present Illness:    FOCUSED PROBLEM LIST:   1.  Acute stroke March 2024 with MRI demonstrating small embolic infarctions of the left posterior frontal lobe; did not receive TNK.  CTA was negative for large vessel occlusion and DVT was negative for extremity DVT.  Transcranial Doppler did suggest a small PFO; ROPE score of 5 2.  Hypertension 3.  Hyperlipidemia 4.  Prediabetes 5.  Remote 15 p-y smoking history; quit 1997  The patient is a 62 y.o. female with the indicated medical history here for recommendations regarding therapy in the context of a small PFO and history of stroke.  The patient was in her normal state of health up until early March when she presented with an acute stroke.  Her imaging studies were significant for multiple small left posterior frontal lobe infarctions.  Her CTA and lower extremity ultrasound were negative.  Transcranial Doppler suggested a small PFO.  An echocardiogram demonstrated normal LV function with no significant valvular abnormalities.  A bubble study echo was not performed.  She was discharged home on dual antiplatelet therapy and atorvastatin.  There were plans for implantable Linq monitor however this was never performed.  She fortunately tells me that she is not developed any recurrent signs or symptoms of stroke.  She works full-time and owns a [employer].  She has been completely compliant with her medications.  She denies any palpitations, anginal symptoms, presyncope, or syncope.        Current Medications: Current Meds  Medication Sig   albuterol (VENTOLIN HFA) 108 (90 Base) MCG/ACT inhaler INHALE 2 PUFFS INTO LUNGS EVERY 6 HOURS AS NEEDED FOR WHEEZING FOR SHORTNESS OF BREATH   amLODipine (NORVASC) 5 MG tablet Take 5 mg by mouth daily.   aspirin EC 81 MG tablet Take 1 tablet (81 mg total) by mouth daily. Swallow whole.   atorvastatin (LIPITOR) 20 MG tablet Take 1 tablet (20 mg total) by mouth daily.   Blood Pressure  Monitoring (3 SERIES BP MONITOR/UPPER ARM) DEVI Use as directed   cetirizine (ZYRTEC) 10 MG tablet Take 10 mg by mouth daily as needed for allergies.   Cyanocobalamin (B-12 COMPLIANCE INJECTION) 1000 MCG/ML KIT Inject 1,000 mcg as directed every 30 (thirty) days.   EPINEPHrine 0.3 mg/0.3 mL IJ SOAJ injection Inject 0.3 mg into the muscle as needed for anaphylaxis.   ferrous sulfate 325 (65 FE) MG tablet Take 1 tablet (325 mg total) by mouth every other day.   MAGNESIUM PO Take 1 tablet by mouth 2 (two) times daily.   Multiple Vitamin (MULTIVITAMIN WITH MINERALS) TABS tablet Take 1 tablet by mouth daily.     Allergies:    Epinephrine, Shellfish allergy, Codeine, Latex, Lidocaine, Metronidazole, Morphine, Other, Oxycodone, Oxycodone-acetaminophen, and Sulfonamide derivatives   Social History:   Social History   Tobacco Use   Smoking status: Former    Types: Cigarettes    Quit date: 09/13/1995    Years since quitting: 26.7   Smokeless tobacco: Never  Vaping Use   Vaping Use: Never used  Substance Use Topics   Alcohol use: Yes    Alcohol/week: 0.5 standard drinks of alcohol    Types: 1 drink(s) per week   Drug use: No     Family Hx: Family History  Problem Relation Age of Onset   Hypertension Mother    Kidney disease Mother    Cholecystitis Mother    Hypertension Father    Diabetes Father    Kidney disease Daughter      Review of Systems:   Please see the history of present illness.    All other systems reviewed and are negative.     EKGs/Labs/Other Test Reviewed:    EKG:  EKG performed March 2024 that I personally reviewed demonstrates sinus rhythm with sinus arrhythmia.  Prior CV studies:  Cardiac Studies & Procedures       ECHOCARDIOGRAM  ECHOCARDIOGRAM COMPLETE 04/18/2022  Narrative ECHOCARDIOGRAM REPORT    Patient Name:   Meghan [name] Date of Exam: 04/18/2022 Medical Rec #:  [id number]        Height:       66.0 in Accession #:    [id number]        Weight:       180.0 lb Date of Birth:  [birth year]       BSA:          1.912 m Patient Age:    61 years         BP:           133/74 mmHg Patient Gender: F                HR:           55 bpm. Exam Location:  Inpatient  Procedure: 2D Echo, Cardiac Doppler and Color Doppler  Indications:    stroke  History:  Patient has no prior history of Echocardiogram examinations.  Sonographer:    Mike Gip Referring Phys: [id number] Lynnae January  IMPRESSIONS   1. Left ventricular ejection fraction, by estimation, is 60 to 65%. The left ventricle has normal function. The left ventricle has no regional wall motion abnormalities. There is mild left ventricular hypertrophy. Left ventricular diastolic parameters were normal. 2. Right ventricular systolic function is normal. The right ventricular size is normal. There is normal pulmonary artery systolic pressure. 3. The mitral valve is normal in structure. No evidence of mitral valve regurgitation. No evidence of mitral stenosis. 4. The aortic valve is normal in structure. Aortic valve regurgitation is not visualized. No aortic stenosis is present. 5. The inferior vena cava is normal in size with greater than 50% respiratory variability, suggesting right atrial pressure of 3 mmHg.  FINDINGS Left Ventricle: Left ventricular ejection fraction, by estimation, is 60 to 65%. The left ventricle has normal function. The left ventricle has no regional wall motion abnormalities. The left ventricular internal cavity size was normal in size. There is mild left ventricular hypertrophy. Left ventricular diastolic parameters were normal.  Right Ventricle: The right ventricular size is normal. No increase in right ventricular wall thickness. Right ventricular systolic function is normal. There is normal pulmonary artery systolic pressure. The tricuspid regurgitant velocity is 1.74 m/s, and with an assumed right atrial pressure of 3 mmHg, the estimated right  ventricular systolic pressure is 15.1 mmHg.  Left Atrium: Left atrial size was normal in size.  Right Atrium: Right atrial size was normal in size.  Pericardium: There is no evidence of pericardial effusion.  Mitral Valve: The mitral valve is normal in structure. No evidence of mitral valve regurgitation. No evidence of mitral valve stenosis.  Tricuspid Valve: The tricuspid valve is normal in structure. Tricuspid valve regurgitation is trivial. No evidence of tricuspid stenosis.  Aortic Valve: The aortic valve is normal in structure. Aortic valve regurgitation is not visualized. No aortic stenosis is present.  Pulmonic Valve: The pulmonic valve was normal in structure. Pulmonic valve regurgitation is not visualized. No evidence of pulmonic stenosis.  Aorta: The aortic root is normal in size and structure.  Venous: The inferior vena cava is normal in size with greater than 50% respiratory variability, suggesting right atrial pressure of 3 mmHg.  IAS/Shunts: No atrial level shunt detected by color flow Doppler.   LEFT VENTRICLE PLAX 2D LVIDd:         4.10 cm     Diastology LVIDs:         2.30 cm     LV e' medial:    4.68 cm/s LV PW:         1.00 cm     LV E/e' medial:  16.1 LV IVS:        1.20 cm     LV e' lateral:   8.81 cm/s LVOT diam:     1.80 cm     LV E/e' lateral: 8.6 LV SV:         61 LV SV Index:   32 LVOT Area:     2.54 cm  LV Volumes (MOD) LV vol d, MOD A2C: 70.4 ml LV vol d, MOD A4C: 84.4 ml LV vol s, MOD A2C: 20.7 ml LV vol s, MOD A4C: 30.4 ml LV SV MOD A2C:     49.7 ml LV SV MOD A4C:     84.4 ml LV SV MOD BP:      51.9 ml  RIGHT VENTRICLE             IVC RV Basal diam:  3.80 cm     IVC diam: 1.30 cm RV S prime:     15.40 cm/s TAPSE (M-mode): 2.8 cm  LEFT ATRIUM             Index        RIGHT ATRIUM           Index LA diam:        3.30 cm 1.73 cm/m   RA Area:     15.40 cm LA Vol (A2C):   36.4 ml 19.04 ml/m  RA Volume:   32.70 ml  17.10 ml/m LA Vol  (A4C):   28.1 ml 14.69 ml/m LA Biplane Vol: 33.7 ml 17.62 ml/m AORTIC VALVE LVOT Vmax:   103.00 cm/s LVOT Vmean:  68.400 cm/s LVOT VTI:    0.240 m  AORTA Ao Root diam: 2.70 cm Ao Asc diam:  3.40 cm  MITRAL VALVE               TRICUSPID VALVE MV Area (PHT): 2.80 cm    TR Peak grad:   12.1 mmHg MV Decel Time: 271 msec    TR Vmax:        174.00 cm/s MV E velocity: 75.40 cm/s MV A velocity: 94.30 cm/s  SHUNTS MV E/A ratio:  0.80        Systemic VTI:  0.24 m Systemic Diam: 1.80 cm  Arvilla Meres MD Electronically signed by Arvilla Meres MD Signature Date/Time: 04/18/2022/12:08:50 PM    Final             Other studies Reviewed: Review of the additional studies/records demonstrates: CTA, MRI, and transcranial Doppler  Recent Labs: 04/18/2022: ALT 16; BUN 11; Creatinine, Ser 0.82; Hemoglobin 11.6; Platelets 246; Potassium 3.7; Sodium 139; TSH 1.257   Recent Lipid Panel Lab Results  Component Value Date/Time   CHOL 147 04/18/2022 06:34 AM   CHOL 146 12/02/2021 02:55 PM   TRIG 113 04/18/2022 06:34 AM   HDL 35 (L) 04/18/2022 06:34 AM   HDL 42 12/02/2021 02:55 PM   LDLCALC 89 04/18/2022 06:34 AM   LDLCALC 90 12/02/2021 02:55 PM    Risk Assessment/Calculations:                Physical Exam:    VS:  BP 132/80   Pulse 71   Ht 5\' 6"  (1.676 m)   Wt 191 lb 6.4 oz (86.8 kg)   SpO2 99%   BMI 30.89 kg/m    Wt Readings from Last 3 Encounters:  06/16/22 191 lb 6.4 oz (86.8 kg)  05/13/22 188 lb 6.4 oz (85.5 kg)  04/29/22 190 lb 6.4 oz (86.4 kg)    GENERAL:  No apparent distress, AOx3 HEENT:  No carotid bruits, +2 carotid impulses, no scleral icterus CAR: RRR no murmurs, gallops, rubs, or thrills RES:  Clear to auscultation bilaterally ABD:  Soft, nontender, nondistended, positive bowel sounds x 4 VASC:  +2 radial pulses, +2 carotid pulses, palpable pedal pulses NEURO:  CN 2-12 grossly intact; motor and sensory grossly intact PSYCH:  No active depression or  anxiety EXT:  No edema, ecchymosis, or cyanosis  Signed, Orbie Pyo, MD  06/16/2022 5:07 PM    [facility] [address] [place], [place], [place]  [address] Phone: [phone]; Fax: [phone]   Note:  This document was prepared using Dragon voice recognition software and  may include unintentional dictation errors.

## 2022-06-16 NOTE — Patient Instructions (Addendum)
Medication Instructions:  Your physician recommends that you continue on your current medications as directed. Please refer to the Current Medication list given to you today.  *If you need a refill on your cardiac medications before your next appointment, please call your pharmacy*   Testing/Procedures: ECHO limited with bubble study  Your physician has requested that you have an echocardiogram. Echocardiography is a painless test that uses sound waves to create images of your heart. It provides your doctor with information about the size and shape of your heart and how well your heart's chambers and valves are working. This procedure takes approximately one hour. There are no restrictions for this procedure. Please do NOT wear cologne, perfume, aftershave, or lotions (deodorant is allowed). Please arrive 15 minutes prior to your appointment time.   Your physician has recommended that you wear an event monitor. Event monitors are medical devices that record the heart's electrical activity. Doctors most often [place] these monitors to diagnose arrhythmias. Arrhythmias are problems with the speed or rhythm of the heartbeat. The monitor is a small, portable device. You can wear one while you do your normal daily activities. This is usually used to diagnose what is causing palpitations/syncope (passing out).   Follow-Up: At [facility], you and your health needs are our priority.  As part of our continuing mission to provide you with exceptional heart care, we have created designated Provider Care Teams.  These Care Teams include your primary Cardiologist (physician) and Advanced Practice Providers (APPs -  Physician Assistants and Nurse Practitioners) who all work together to provide you with the care you need, when you need it.   Your next appointment:   4 month(s)  Provider:   Dr [name]  Other Instructions [employer] CARDIAC EVENT MONITOR INSTRUCTIONS  Your physician has requested  you wear a cardiac event monitor for _30_ days. [employer] may call or text to confirm a shipping address. The monitor will be sent to a land address via UPS. [employer] will not ship a monitor to a PO BOX. It typically takes 3-5 days to receive your monitor after it has been enrolled. [employer] will assist with USPS tracking if your package is delayed. The telephone number for [employer] is [phone].  Once you have received your monitor, please review the enclosed instructions. Instruction tutorials can also be viewed under help and settings on the enclosed cell phone. Your monitor has already been registered assigning a specific monitor serial # to you.  Billing and Self Pay Discount Information  [employer] has been provided the insurance information we had on file for you. If your insurance has been updated, please call [employer] at [phone] to provide them with your updated insurance information.  [employer] offers a discounted Self Pay option for patients who have insurance that does not cover their cardiac event monitor or patients without insurance. The discounted cost of a Self Pay Cardiac Event Monitor would be $225.00 , if the patient contacts [employer] at [phone] within 7 days of applying the monitor to make payment arrangements. If the patient does not contact [employer] within 7 days of applying the monitor, the cost of the cardiac event monitor will be $350.00.  Applying the monitor  Remove cell phone from case and turn it on. The cell phone works as IT consultant and needs to be always within 10 feet of you. The cell phone will need to be charged daily. We recommend you plug the cell phone into the enclosed charger at your bedside table every  night.  Monitor batteries: You will receive two monitor batteries labelled #1 and #2. These are your recorders. Plug battery #2 onto the second connection on the enclosed charger. Always keep one battery on the charger. This  will keep the monitor battery deactivated. It will also keep it fully charged for when you need to switch your monitor batteries. A small light will blink on the battery emblem when it is charging. The light on the battery emblem will remain on when the battery is fully charged.  Open package of a Monitor strip. Insert battery #1 into black hood on strip and gently squeeze monitor battery onto connection as indicated in instruction booklet. Set aside while preparing skin.  Choose the location for your strip, vertical or horizontal, as indicated in the instruction booklet. Shave to remove all hair from location. There cannot be any lotions, oils, powders, or colognes on skin where monitor is to be applied. Wipe skin clean with enclosed Saline wipe. Dry skin completely.  Peel paper labelled #1 off the back of the Monitor strip exposing the adhesive. Place the monitor on the chest in the vertical or horizontal position shown in the instruction booklet. One arrow on the monitor strip must be pointing upward. Carefully remove paper labelled #2, attaching remainder of strip to your skin. Try not to create any folds or wrinkles in the strip as you apply it.  Firmly press and release the circle in the center of the monitor battery. You will hear a small beep. This is turning the monitor battery on. The heart emblem on the monitor battery will light up every 5 seconds if the monitor battery is turned on and connected to the patient securely. Do not push and hold the circle down as this turns the monitor battery off. The cell phone will locate the monitor battery. A screen will appear on the cell phone checking the connection of your monitor strip. This may read poor connection initially but change to good  connection within the next minute. Once your monitor accepts the connection you will hear a series of 3 beeps followed by a climbing crescendo of beeps. A screen will appear on the cell phone showing the two  monitor strip placement options. Touch the picture that demonstrates where you applied the monitor strip.  Your monitor strip and battery are waterproof. You will be able to shower, bathe, or swim with the monitor on. Please do not submerge deeper than 3 feet underwater. We recommend removing the monitor if you are swimming in a lake, river, or ocean.  Your monitor battery will need to be switched to a fully charged monitor battery approximately once a week. The cell phone will alert you of an action which needs to be taken.  On the cell phone, tap for details to reveal connection status, monitor battery status, and cell phone battery status. A green dot will indicate your monitor is in good status, however, a red dot will indicate something that needs your attention.  To record a symptom, click the circle on the monitor battery. In 30-60 seconds, a list of symptoms will appear on the cell phone. Select your symptoms and tap save. Your monitor will record a sustained or significant arrhythmia regardless of you clicking the button. Some patients do not feel the heart rhythm irregularities. [employer] will notify us of any serious or critical events.  Refer to instruction booklet for instructions on switching batteries, changing strips, the Do not disturb or Pause features, or any  additional questions.  Call [employer] at [phone], to confirm your monitor is transmitting and record your baseline. They will answer any questions you may have regarding the monitor instructions at that time.  Returning the monitor to [employer]  Place all equipment back into the blue box. Peel off strip of paper to expose adhesive and close box securely. There is a prepaid UPS shipping label on this box. Drop in a UPS drop box, or at a UPS facility like [employer]. You may also contact [employer] to arrange UPS to pick up monitor package at your home.

## 2022-06-17 ENCOUNTER — Ambulatory Visit: Payer: Commercial Managed Care - HMO | Attending: Internal Medicine

## 2022-06-17 DIAGNOSIS — I639 Cerebral infarction, unspecified: Secondary | ICD-10-CM | POA: Diagnosis not present

## 2022-06-17 DIAGNOSIS — R42 Dizziness and giddiness: Secondary | ICD-10-CM

## 2022-06-17 DIAGNOSIS — I498 Other specified cardiac arrhythmias: Secondary | ICD-10-CM

## 2022-06-17 NOTE — Progress Notes (Unsigned)
[id number] [employer] cardiac event monitor from office inventory applied to patient.

## 2022-06-18 ENCOUNTER — Institutional Professional Consult (permissible substitution): Payer: Commercial Managed Care - HMO | Admitting: Neurology

## 2022-06-30 ENCOUNTER — Ambulatory Visit (INDEPENDENT_AMBULATORY_CARE_PROVIDER_SITE_OTHER): Payer: Commercial Managed Care - HMO | Admitting: Neurology

## 2022-06-30 ENCOUNTER — Encounter: Payer: Self-pay | Admitting: Neurology

## 2022-06-30 ENCOUNTER — Ambulatory Visit (INDEPENDENT_AMBULATORY_CARE_PROVIDER_SITE_OTHER): Payer: Commercial Managed Care - HMO

## 2022-06-30 VITALS — BP 141/76 | HR 65 | Ht 66.0 in | Wt 188.4 lb

## 2022-06-30 DIAGNOSIS — Z8669 Personal history of other diseases of the nervous system and sense organs: Secondary | ICD-10-CM | POA: Insufficient documentation

## 2022-06-30 DIAGNOSIS — I639 Cerebral infarction, unspecified: Secondary | ICD-10-CM | POA: Diagnosis not present

## 2022-06-30 DIAGNOSIS — G9332 Myalgic encephalomyelitis/chronic fatigue syndrome: Secondary | ICD-10-CM

## 2022-06-30 DIAGNOSIS — E538 Deficiency of other specified B group vitamins: Secondary | ICD-10-CM | POA: Diagnosis not present

## 2022-06-30 DIAGNOSIS — I63412 Cerebral infarction due to embolism of left middle cerebral artery: Secondary | ICD-10-CM

## 2022-06-30 DIAGNOSIS — Q2112 Patent foramen ovale: Secondary | ICD-10-CM | POA: Diagnosis not present

## 2022-06-30 DIAGNOSIS — R0683 Snoring: Secondary | ICD-10-CM | POA: Diagnosis not present

## 2022-06-30 MED ORDER — CYANOCOBALAMIN 1000 MCG/ML IJ SOLN
1000.0000 ug | Freq: Once | INTRAMUSCULAR | Status: AC
Start: 2022-06-30 — End: 2022-06-30
  Administered 2022-06-30: 1000 ug via INTRAMUSCULAR

## 2022-06-30 NOTE — Patient Instructions (Signed)
   62 y.o. year old AA female here following a referral of the stroke service liaison, Ihor Austin, NP.     1) Mrs. TENLY TROLINGER suffered a small stroke in the frontal lobe , causing transient facial numbness and dysarthria. She has no known sequelae.    2) Her reported fatigue was present before the stroke occurred and is chronic.  There is no EDS, she is not sleepy . Nocturnal sleep is refreshing an restorative.  There is snoring reported. During her hospital stay, no evidence of hypoxia or sleep apnea per telemetry reports.   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 Check for anemia. Check for allergies. I will offer a HST to this patient, but I suspect a low yield given the lack of sleepiness or any sleep related symptoms.   FU through [place] NP within 6 months.  The sleep neurologist will only follow up if the HST is positive for  OSA.   I would like to thank Delia Heady, MD and Ihor Austin, Np [address] [place],  [place] [address] for allowing me to meet with and to take care of this pleasant patient.   CC: I will share my notes with Delia Heady, MD .

## 2022-06-30 NOTE — Progress Notes (Signed)
SLEEP MEDICINE CLINIC    Provider:  Melvyn Novas, MD  Primary Care Physician:  Carney Living, MD [address] [place] [address]     Referring Provider: Ihor Austin, Np [address] [place],  [place] [address]     Primary neurologist : Delia Heady, MD      Chief Complaint according to patient   Patient presents with:     New Patient (Initial Visit) referral by Ihor Austin, NP      Ischemic stroke of frontal lobe [facility]) +1 more Dx4-03-2022 [facility]  New Patient (Initial Visit) STROKE ; Referred by Marvel Plan, MD        HISTORY OF PRESENT ILLNESS:  Meghan [name] is a 62 y.o. female patient who is seen upon referral on 06/30/2022 from Stroke MD/ NP  for a sleep study/.  Chief concern according to patient :  I feel tired and fatigued, and I snore.     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  She was never told that she had nocturnal hypoxia or any sleep apnea witnessed during her time in the hospital. She has a history of asthma which can be aggravated during allergy season, carries a rescue inhaler.  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  She does report migraines.  On 17 April 2022 she presented with slurred speech and feeling as if she could not move her lips and tongue.  There was numbness in the right face and this numbness did not extend into her right upper extremity. An MRI showed a small left frontal cortical acute infarct.  The etiology was a possibly persistent foraminal ovale versus atrial fibrillation.  CTA of the head and neck was negative, her cardiac echo showed an ejection fraction of 60 to 65%.  Lower extremity Dopplers were negative for deep venous thrombosis.  She did have a TCD transcranial Doppler with bubble study that indicated the presence of a small  persistent foramen ovale.  An outpatient TEE was then recommended and a loop recorder placement.  This time has now passed and there has been no atrial fibrillation reported, she is not having a loop ,only a outside zio patch. Dr [name] is planning for a new cardiac bubble study -  Plavix was completed, Aspirin was continued. Lipitor.  No therapies were needed.  PERTINENT IMAGING   Per hospitalization 04/17/2022 -04/18/2022 Code Stroke CT head No acute abnormality. ASPECTS 10.    CTA head & neck No LVO MRI and repeat MRI showed Small acute infarcts in the high left posterior frontal lobe 2D Echo EF 60 to 65% TCD Bubble Study- Positive for small PFO Lower extremity venous no DVT LDL 89 HgbA1c 5.8       Sleep relevant medical history: 0-2 times Nocturia/ childhood Tonsillectomy,  adenoidectomy as an adult at age 62. Sickle cell trait. Overweight. Cholesterolemia. CVA as embolism for patent PFO ( assumed).migraine with improvement before menopause.   Family medical /sleep history: NO other family member with OSA.Marland Kitchen    Social history:  Patient is working as a Administrator, Civil Service, from home-  and lives in a household alone. Family status is widowed since age 35, with 2 adult  children.   The patient currently works. Pets are not present. Tobacco use; quit in 1995.  ETOH use: when  eating out- ,  Caffeine intake in form of Coffee( /) Soda( /) Tea ( 2-3 cups max  a day, green tea-) or energy drinks Exercise in form of walking.    Sleep habits are as follows: The patient's dinner time is between 6.30 PM. The patient goes to bed at 9.30 PM and asleep by 10 PM , she continues to sleep for 6-7 hours, wakes for 1-2 bathroom breaks.  The preferred sleep position is side ways , with the support of 2-3 pillows.  Dreams are reportedly rare.   The patient wakes up spontaneously at 5. 30,  5.30  AM is the usual rise time. One of her clients comes to her home at 4.30 AM , sleeping again  She reports  feeling refreshed or restored in AM, without symptoms such as dry mouth, rare morning headaches, and residual fatigue.  Naps are taken rarely.    Review of Systems: Out of a complete 14 system review, the patient complains of only the following symptoms, and all other reviewed systems are negative.:   Sinus congestion, airway congestion no GERD, fatigue but not sleepy. Daughter reported snoring.     How likely are you to doze in the following situations: 0 = not likely, 1 = slight chance, 2 = moderate chance, 3 = high chance   Sitting and Reading? Watching Television? Sitting inactive in a public place (theater or meeting)? As a passenger in a car for an hour without a break? Lying down in the afternoon when circumstances permit? Sitting and talking to someone? Sitting quietly after lunch without alcohol? In a car, while stopped for a few minutes in traffic?   Total = 00/ 24 points   FSS endorsed at 30/ 63 points.   Social History   Socioeconomic History   Marital status: Widowed    Spouse name: Not on file   Number of children: Not on file   Years of education: Not on file   Highest education level: Not on file  Occupational History   Not on file  Tobacco Use   Smoking status: Former    Types: Cigarettes    Quit date: 09/13/1995    Years since quitting: 26.8   Smokeless tobacco: Never  Vaping Use   Vaping Use: Never used  Substance and Sexual Activity   Alcohol use: Yes    Alcohol/week: 0.5 standard drinks of alcohol    Types: 1 drink(s) per week   Drug use: No   Sexual activity: Not Currently  Other Topics Concern   Not on file  Social History Narrative   Not on file   Social Determinants of Health   Financial Resource Strain: Not on file  Food Insecurity: Not on file  Transportation Needs: Not on file  Physical Activity: Not on file  Stress: Not on file  Social Connections: Not on file    Family History  Problem Relation Age of Onset   Hypertension  Mother    Kidney disease Mother    Cholecystitis Mother    Hypertension Father    Diabetes Father    Kidney disease Daughter     Past Medical History:  Diagnosis   Date   Angio-edema    Asthma    Back pain 01/13/2012   CVA , PFO - 2024    Malignant hyperthermia    1988, 1987   Unspecified sinusitis (chronic) 08/12/2012   Urticaria     Past Surgical History:  Procedure Laterality Date   ADENOIDECTOMY  COLONOSCOPY WITH PROPOFOL N/A 08/02/2021   Procedure: COLONOSCOPY WITH PROPOFOL;  Surgeon: Jeani Hawking, MD;  Location: [facility];  Service: Gastroenterology;  Laterality: N/A;   ESOPHAGOGASTRODUODENOSCOPY (EGD) WITH PROPOFOL N/A 02/25/2019   Procedure: ESOPHAGOGASTRODUODENOSCOPY (EGD) WITH PROPOFOL;  Surgeon: Jeani Hawking, MD;  Location: [facility];  Service: Endoscopy;  Laterality: N/A;   ESOPHAGOGASTRODUODENOSCOPY (EGD) WITH PROPOFOL N/A 08/02/2021   Procedure: ESOPHAGOGASTRODUODENOSCOPY (EGD) WITH PROPOFOL;  Surgeon: Jeani Hawking, MD;  Location: [facility];  Service: Gastroenterology;  Laterality: N/A;   HEMOSTASIS CLIP PLACEMENT  02/25/2019   Procedure: HEMOSTASIS CLIP PLACEMENT;  Surgeon: Jeani Hawking, MD;  Location: [facility];  Service: Endoscopy;;   HEMOSTASIS CLIP PLACEMENT  08/02/2021   Procedure: HEMOSTASIS CLIP PLACEMENT;  Surgeon: Jeani Hawking, MD;  Location: [facility];  Service: Gastroenterology;;   POLYPECTOMY  02/25/2019   Procedure: POLYPECTOMY;  Surgeon: Jeani Hawking, MD;  Location: [facility];  Service: Endoscopy;;   POLYPECTOMY  08/02/2021   Procedure: POLYPECTOMY;  Surgeon: Jeani Hawking, MD;  Location: [facility];  Service: Gastroenterology;;  EGD and COLON   TONSILLECTOMY  1987   TUBAL LIGATION  1988   UTERINE ARTERY EMBOLIZATION  2000   for fibroids      Current Outpatient Medications on File Prior to Visit  Medication Sig Dispense Refill   albuterol (VENTOLIN HFA) 108 (90 Base) MCG/ACT inhaler INHALE 2 PUFFS INTO LUNGS EVERY 6 HOURS  AS NEEDED FOR WHEEZING FOR SHORTNESS OF BREATH 18 g 0   amLODipine (NORVASC) 5 MG tablet Take 5 mg by mouth daily.     aspirin EC 81 MG tablet Take 1 tablet (81 mg total) by mouth daily. Swallow whole. 30 tablet 3   atorvastatin (LIPITOR) 20 MG tablet Take 1 tablet (20 mg total) by mouth daily. 90 tablet 0   Blood Pressure Monitoring (3 SERIES BP MONITOR/UPPER ARM) DEVI Use as directed 1 each 0   cetirizine (ZYRTEC) 10 MG tablet Take 10 mg by mouth daily as needed for allergies.     Cyanocobalamin (B-12 COMPLIANCE INJECTION) 1000 MCG/ML KIT Inject 1,000 mcg as directed every 30 (thirty) days.     EPINEPHrine 0.3 mg/0.3 mL IJ SOAJ injection Inject 0.3 mg into the muscle as needed for anaphylaxis. 2 each 1   ferrous sulfate 325 (65 FE) MG tablet Take 1 tablet (325 mg total) by mouth every other day.     MAGNESIUM PO Take 1 tablet by mouth 2 (two) times daily.     Multiple Vitamin (MULTIVITAMIN WITH MINERALS) TABS tablet Take 1 tablet by mouth daily.     No current facility-administered medications on file prior to visit.    Allergies  Allergen Reactions   Epinephrine Other (See Comments)    Shuts body down   Shellfish Allergy Itching and Swelling   Codeine Hives and Itching   Latex Swelling and Hives    Per allergist   Lidocaine Swelling    Disorientation, shocks system  OK to use mepivacaine     Metronidazole Hives and Itching   Morphine     Shuts system down    Other     malignant hyperthermia   Oxycodone     Shut system down    Oxycodone-Acetaminophen     Shuts system down   Sulfonamide Derivatives Hives and Itching     DIAGNOSTIC DATA (LABS, IMAGING, TESTING) - I reviewed patient records, labs, notes, testing and imaging myself where available.  Lab Results  Component Value Date   WBC 4.2 04/18/2022  HGB 11.6 (L) 04/18/2022   HCT 35.6 (L) 04/18/2022   MCV 90.4 04/18/2022   PLT 246 04/18/2022      Component Value Date/Time   NA 139 04/18/2022 0634   NA 144  12/02/2021 1455   K 3.7 04/18/2022 0634   CL 107 04/18/2022 0634   CO2 24 04/18/2022 0634   GLUCOSE 106 (H) 04/18/2022 0634   BUN 11 04/18/2022 0634   BUN 9 12/02/2021 1455   CREATININE 0.82 04/18/2022 0634   CREATININE 0.63 01/09/2016 0945   CALCIUM 9.3 04/18/2022 0634   PROT 6.5 04/18/2022 0634   PROT 6.6 12/02/2021 1455   ALBUMIN 3.3 (L) 04/18/2022 0634   ALBUMIN 4.3 12/02/2021 1455   AST 17 04/18/2022 0634   ALT 16 04/18/2022 0634   ALKPHOS 57 04/18/2022 0634   BILITOT 0.6 04/18/2022 0634   BILITOT 0.4 12/02/2021 1455   GFRNONAA >60 04/18/2022 0634   GFRNONAA >89 01/09/2016 0945   GFRAA 101 11/07/2019 1135   GFRAA >89 01/09/2016 0945   Lab Results  Component Value Date   CHOL 147 04/18/2022   HDL 35 (L) 04/18/2022   LDLCALC 89 04/18/2022   TRIG 113 04/18/2022   CHOLHDL 4.2 04/18/2022   Lab Results  Component Value Date   HGBA1C 5.6 04/18/2022   Lab Results  Component Value Date   VITAMINB12 208 (L) 08/19/2019   Lab Results  Component Value Date   TSH 1.257 04/18/2022    PHYSICAL EXAM:  Today's Vitals   06/30/22 1456  BP: (!) 141/76  Pulse: 65  Weight: 188 lb 6.4 oz (85.5 kg)  Height: 5\' 6"  (1.676 m)   Body mass index is 30.41 kg/m.   Wt Readings from Last 3 Encounters:  06/30/22 188 lb 6.4 oz (85.5 kg)  06/16/22 191 lb 6.4 oz (86.8 kg)  05/13/22 188 lb 6.4 oz (85.5 kg)     Ht Readings from Last 3 Encounters:  06/30/22 5\' 6"  (1.676 m)  06/16/22 5\' 6"  (1.676 m)  05/13/22 5\' 6"  (1.676 m)      General: The patient is awake, alert and appears not in acute distress. The patient is well groomed. Head: Normocephalic, atraumatic. Neck is supple. Mallampati 3,  neck circumference:14. 25  inches . Nasal airflow only partially patent.  Retrognathia is not  seen.  Invisaline retainer.  Dental status: biological  Cardiovascular:  Regular rate and cardiac rhythm by pulse,  without distended neck veins. Respiratory: Lungs are clear to auscultation.   Skin:  Without evidence of ankle edema, or rash. Trunk: The patient's posture is erect.   NEUROLOGIC EXAM: The patient is awake and alert, oriented to place and time.   Memory subjective described as intact.  Attention span & concentration ability appears normal.  Speech is fluent,  without  dysarthria, dysphonia or aphasia.  Mood and affect are appropriate.   Cranial nerves: no loss of smell or taste reported  Pupils are equal and briskly reactive to light. Funduscopic exam deferred.  Extraocular movements in vertical and horizontal planes were intact and without nystagmus. No Diplopia. Visual fields by finger perimetry are intact. Hearing was intact to soft voice and finger rubbing.    Facial sensation intact to fine touch.  Facial motor strength is symmetric and tongue and uvula move midline.  Neck ROM : rotation, tilt and flexion extension were normal for age and shoulder shrug was symmetrical.    Motor exam:  Symmetric bulk, tone and ROM.   Normal tone without cog-  wheeling, symmetric grip strength . Left handed patient.    Sensory:  Fine touch, pinprick and vibration were tested  and  normal.  Proprioception  mild drift of the right arm , not a pronation/ .    Coordination: Rapid alternating movements in the fingers/hands were of normal speed.  The Finger-to-nose maneuver was intact without evidence of ataxia, dysmetria or tremor.   Gait and station: Patient could rise unassisted from a seated position, walked without assistive device.  Stance is of normal width/ base .  Toe and heel walk were deferred.  Deep tendon reflexes: in the  upper and lower extremities are symmetric and intact.  Babinski response was deferred.     ASSESSMENT AND PLAN :   62 y.o. year old AA female here following a referral of the stroke service liaison, Ihor Austin, NP.     1) Mrs. JILLAINE HAMADA suffered a small stroke in the frontal lobe , causing transient facial numbness and  dysarthria. She has no known sequelae.    2) Her reported fatigue was present before the stroke occurred and is chronic.  There is no EDS, she is not sleepy . Nocturnal sleep is refreshing an restorative.  There is snoring reported. During her hospital stay, no evidence of hypoxia or sleep apnea per telemetry reports.   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 Check for anemia. Check for allergies. I will offer a HST to this patient, but I suspect a low yield given the lack of sleepiness or any sleep related symptoms.   FU through [facility] NP within 6 months.  The sleep neurologist will only follow up if the HST is positive for  OSA.   I would like to thank Delia Heady, MD and Ihor Austin, Np [address] [place],  [place] [address] for allowing me to meet with and to take care of this pleasant patient.   CC: I will share my notes with Delia Heady, MD .  After spending a total time of  30  minutes face to face and additional time for physical and neurologic examination, review of laboratory studies,  personal review of imaging studies, reports and results of other testing and review of referral information / records as far as provided in visit,   Electronically signed by: Melvyn Novas, MD 06/30/2022 3:26 PM  [facility] and [facility] Sleep Board certified by The [employer] of Sleep Medicine and Diplomate of the [employer] of Sleep Medicine. Board certified In Neurology through the ABPN, Fellow of the [employer] of Neurology. Medical Director of [employer].

## 2022-06-30 NOTE — Progress Notes (Signed)
Pt is here for a b12 injection today. Spoke with Dr. McDiarmid as patient is ~one week early for injection. Received verbal orders to administer injection today.   She will follow up with PCP next week.   Date of last office visit that b12 was discussed 04/29/2022  Last injection was 06/09/2022  Injection given in RD deltoid, pt tolerated well. Patient has follow up appointment with PCP next week on 07/08/22.   Veronda Prude, RN

## 2022-07-08 ENCOUNTER — Encounter: Payer: Self-pay | Admitting: Family Medicine

## 2022-07-08 ENCOUNTER — Other Ambulatory Visit: Payer: Self-pay

## 2022-07-08 ENCOUNTER — Ambulatory Visit (INDEPENDENT_AMBULATORY_CARE_PROVIDER_SITE_OTHER): Payer: Commercial Managed Care - HMO | Admitting: Family Medicine

## 2022-07-08 VITALS — BP 138/72 | HR 65 | Wt 187.8 lb

## 2022-07-08 DIAGNOSIS — D649 Anemia, unspecified: Secondary | ICD-10-CM | POA: Insufficient documentation

## 2022-07-08 DIAGNOSIS — I10 Essential (primary) hypertension: Secondary | ICD-10-CM | POA: Diagnosis not present

## 2022-07-08 DIAGNOSIS — D6489 Other specified anemias: Secondary | ICD-10-CM | POA: Diagnosis not present

## 2022-07-08 DIAGNOSIS — E538 Deficiency of other specified B group vitamins: Secondary | ICD-10-CM

## 2022-07-08 DIAGNOSIS — R87618 Other abnormal cytological findings on specimens from cervix uteri: Secondary | ICD-10-CM | POA: Diagnosis not present

## 2022-07-08 DIAGNOSIS — E785 Hyperlipidemia, unspecified: Secondary | ICD-10-CM | POA: Diagnosis not present

## 2022-07-08 DIAGNOSIS — E041 Nontoxic single thyroid nodule: Secondary | ICD-10-CM | POA: Diagnosis not present

## 2022-07-08 NOTE — Assessment & Plan Note (Signed)
Mild anemia last cbc check.  Will repeat with B12 and ferritin today

## 2022-07-08 NOTE — Assessment & Plan Note (Signed)
Recommend make appointment with her gyn or her new PCP

## 2022-07-08 NOTE — Assessment & Plan Note (Addendum)
At goal today on 5 mg amlodipine.  She wants to stop this.  Decided to try 1/2 tab daily with monitoring her blood pressure at home.  Discussed weight control and salt reduction

## 2022-07-08 NOTE — Progress Notes (Signed)
    SUBJECTIVE:   CHIEF COMPLAINT / HPI:    Vitamin B 12 deficiency Getting shots monthly. She feels if she misses a month she is more tired  Had been taking iron daily but not for the last few months    Essential hypertension Has blood pressure cuff at home but has not been checking Taking 5mg  amlodipine daily Would like to come off all her blood pressure medications    OBJECTIVE:   BP 138/72   Pulse 65   Wt 187 lb 12.8 oz (85.2 kg)   SpO2 100%   BMI 30.31 kg/m   Heart - Regular rate and rhythm.  No murmurs, gallops or rubs.    Lungs:  Normal respiratory effort, chest expands symmetrically. Lungs are clear to auscultation, no crackles or wheezes. Extremities:  No cyanosis, edema, or deformity noted with good range of motion of all major joints.     ASSESSMENT/PLAN:   Thyroid nodule  Vitamin B 12 deficiency -     Vitamin B12  Anemia due to other cause, not classified Assessment & Plan: Mild anemia last cbc check.  Will repeat with B12 and ferritin today   Orders: -     Ferritin -     CBC  Hyperlipidemia, unspecified hyperlipidemia type -     Lipid panel  Essential hypertension Assessment & Plan: At goal today on 5 mg amlodipine.  She wants to stop this.  Decided to try 1/2 tab daily with monitoring her blood pressure at home.  Discussed weight control and salt reduction   Other abnormal cytological finding of specimen from cervix Assessment & Plan: Recommend make appointment with her gyn or her new PCP       Patient Instructions  Good to see you today - Thank you for coming in  Things we discussed today:  You need a Pap smear to prevent cervical cancer.  Please make an appointment. - You can make an appointment with Dr Luanne Bras  BP Go down to 1/2 amlodipine tablet daily  Your goal blood pressure is less than 135/85.  Check your blood pressure several times a week.  If regularly higher than this please let me know - either with MyChart or leaving a  phone message. Next visit please bring in your blood pressure cuff.     Avoid salt as much as possible   Lowering your weight would help with the blood pressure   I will call you if your tests are not good.  Otherwise, I will send you a message on MyChart (if it is active) or a letter in the mail..  If you do not hear from me with in 2 weeks please call our office.     Please always bring your medication bottles  Come back to see Dr Linwood Dibbles in 2 months    Carney Living, MD [facility]

## 2022-07-08 NOTE — Patient Instructions (Signed)
Good to see you today - Thank you for coming in  Things we discussed today:  You need a Pap smear to prevent cervical cancer.  Please make an appointment. - You can make an appointment with Dr Luanne Bras  BP Go down to 1/2 amlodipine tablet daily  Your goal blood pressure is less than 135/85.  Check your blood pressure several times a week.  If regularly higher than this please let me know - either with MyChart or leaving a phone message. Next visit please bring in your blood pressure cuff.     Avoid salt as much as possible   Lowering your weight would help with the blood pressure   I will call you if your tests are not good.  Otherwise, I will send you a message on MyChart (if it is active) or a letter in the mail..  If you do not hear from me with in 2 weeks please call our office.     Please always bring your medication bottles  Come back to see Dr Linwood Dibbles in 2 months

## 2022-07-09 LAB — LIPID PANEL
Chol/HDL Ratio: 2.2 ratio (ref 0.0–4.4)
Cholesterol, Total: 101 mg/dL (ref 100–199)
HDL: 46 mg/dL (ref >39)
LDL Chol Calc (NIH): 41 mg/dL (ref 0–99)
Triglycerides: 62 mg/dL (ref 0–149)
VLDL Cholesterol Cal: 14 mg/dL (ref 5–40)

## 2022-07-09 LAB — CBC
Hematocrit: 36.4 % (ref 34.0–46.6)
Hemoglobin: 11.8 g/dL (ref 11.1–15.9)
MCH: 29.4 pg (ref 26.6–33.0)
MCHC: 32.4 g/dL (ref 31.5–35.7)
MCV: 91 fL (ref 79–97)
Platelets: 254 10*3/uL (ref 150–450)
RBC: 4.01 x10E6/uL (ref 3.77–5.28)
RDW: 13.6 % (ref 11.7–15.4)
WBC: 5.1 10*3/uL (ref 3.4–10.8)

## 2022-07-09 LAB — FERRITIN: Ferritin: 18 ng/mL (ref 15–150)

## 2022-07-09 LAB — VITAMIN B12: Vitamin B-12: 505 pg/mL (ref 232–1245)

## 2022-07-15 ENCOUNTER — Ambulatory Visit (HOSPITAL_COMMUNITY): Payer: Commercial Managed Care - HMO

## 2022-07-15 ENCOUNTER — Telehealth: Payer: Self-pay | Admitting: Neurology

## 2022-07-15 NOTE — Telephone Encounter (Signed)
[employer] no auth req/[employer] pending faxed notes

## 2022-07-18 ENCOUNTER — Telehealth: Payer: Self-pay

## 2022-07-18 NOTE — Telephone Encounter (Signed)
Patient calls nurse line regarding atorvastatin refill.   She is asking if she still needs to continue taking medication based off of her most recent lipid panel.   If she is to continue medication, she will need a prescription sent to her pharmacy.   Please advise.   Veronda Prude, RN

## 2022-07-21 MED ORDER — ATORVASTATIN CALCIUM 20 MG PO TABS: 20.0000 mg | ORAL_TABLET | Freq: Every day | ORAL | 3 refills | Status: DC

## 2022-07-21 NOTE — Telephone Encounter (Signed)
Pls let her know I send in a refill and she should keep taking atrorvastatin  Thanks  LC

## 2022-07-22 NOTE — Telephone Encounter (Signed)
Spoke with patient she is aware of atorvastatin being refilled, she did have a question regarding the mediation. She wanted to know how long does she have to take this medication for and is there a possibly she can be titrated off?Penni Bombard CMA

## 2022-07-23 ENCOUNTER — Other Ambulatory Visit: Payer: Self-pay | Admitting: Family Medicine

## 2022-08-11 ENCOUNTER — Telehealth: Payer: Self-pay

## 2022-08-11 NOTE — Telephone Encounter (Signed)
Called and left a voicemail for patient (DPR on file) loop implant denied by her insurance, they will not cover this for her. Just wanted to make the patient aware

## 2022-08-11 NOTE — Telephone Encounter (Signed)
-----   Message from Florina Ou sent at 08/04/2022 12:37 PM EDT ----- Regarding: RE: ILR PA? Good morning,  Her insurance has denied twice on appeal. They will not cover the loop recorder implant.   Thank you,  Marchelle Folks  ----- Message ----- From: Festus Holts, RN Sent: 08/01/2022   5:17 PM EDT To: Florina Ou Subject: RE: ILR PA?                                    Lanae Boast - just sending through staff message since I didn't think the secure chat would be there on Monday for me - You said "the denial was upheld on second level appeal." I'm not sure what that means, is that her insurance stating that they will not approve this at all? Or additional work is needed prior to approval?  Thanks for your time on this, [place], RN  ----- Message ----- From: Florina Ou Sent: 06/17/2022   1:59 PM EDT To: Henrietta Dine, RN; Festus Holts, RN Subject: RE: ILR PA?                                    Prior [place] appeal upheld. Deemed experimental.  ----- Message ----- From: Festus Holts, RN Sent: 06/16/2022  12:57 PM EDT To: [facility] Pre Cert/Auth Subject: ILR PA?                                        Has a prior authorization be completed for this patient to get an ILR implant?  Thanks, Brayton Caves, RN

## 2022-08-13 ENCOUNTER — Ambulatory Visit (HOSPITAL_COMMUNITY): Payer: Commercial Managed Care - HMO | Attending: Internal Medicine

## 2022-08-13 DIAGNOSIS — I6389 Other cerebral infarction: Secondary | ICD-10-CM

## 2022-08-13 DIAGNOSIS — I10 Essential (primary) hypertension: Secondary | ICD-10-CM | POA: Insufficient documentation

## 2022-08-13 DIAGNOSIS — I639 Cerebral infarction, unspecified: Secondary | ICD-10-CM | POA: Diagnosis not present

## 2022-08-13 LAB — ECHOCARDIOGRAM LIMITED BUBBLE STUDY
Area-P 1/2: 2.22 cm2
S' Lateral: 2.4 cm

## 2022-08-19 ENCOUNTER — Encounter: Payer: Self-pay | Admitting: Internal Medicine

## 2022-08-19 DIAGNOSIS — I639 Cerebral infarction, unspecified: Secondary | ICD-10-CM

## 2022-08-20 NOTE — Telephone Encounter (Signed)
Spoke with patient to schedule TEE for 09/10/22. Reviewed instructions and sent via My Chart and a copy via mail/ Patient verbalized understanding and had no questions.

## 2022-08-20 NOTE — Telephone Encounter (Signed)
Left message for patient to return call to schedule office visit prior to her TEE on 7/31.

## 2022-08-25 ENCOUNTER — Telehealth: Payer: Self-pay

## 2022-08-25 NOTE — Telephone Encounter (Signed)
Patient calls nurse line requesting B12 injection. Advised patient that per last visit with Dr. Deirdre Priest, she needed to schedule an appointment with Dr. Linwood Dibbles.   She states that she is unable to schedule with Dr. Linwood Dibbles at this time and that she would need to call back.   She is asking that message be sent to provider to see if she could receive B12 injection until she can get in with Dr. Linwood Dibbles.   Please advise.   Veronda Prude, RN

## 2022-08-25 NOTE — Telephone Encounter (Signed)
Called patient. Scheduled nurse visit for tomorrow morning, 7/16. Also scheduled for PCP follow up on 7/31.  Veronda Prude, RN

## 2022-08-26 ENCOUNTER — Ambulatory Visit: Payer: Commercial Managed Care - HMO

## 2022-08-26 DIAGNOSIS — E538 Deficiency of other specified B group vitamins: Secondary | ICD-10-CM

## 2022-08-26 MED ORDER — CYANOCOBALAMIN 1000 MCG/ML IJ SOLN
1000.0000 ug | Freq: Once | INTRAMUSCULAR | Status: AC
Start: 2022-08-26 — End: 2022-08-26
  Administered 2022-08-26: 1000 ug via INTRAMUSCULAR

## 2022-08-26 NOTE — Progress Notes (Signed)
Pt is here for a b12 injection today.    Date of last office visit that b12 was discussed was on 07/08/2022. Dr. Deirdre Priest discussed following up with PCP regarding B12. Sent message to Dr. Linwood Dibbles yesterday and received orders to administer B12 today. Patient scheduled for follow up visit with PCP on 09/10/22.  Last injection was 06/30/2022  Injection given in LD, pt tolerated well.  Veronda Prude, RN

## 2022-09-01 NOTE — H&P (View-Only) (Signed)
Cardiology Office Note    Date:  09/02/2022  ID:  Meghan [name], DOB November 09, 1960, MRN [id number] PCP:  Caro Laroche, DO  Cardiologist:  Orbie Pyo, MD  Electrophysiologist:  Maurice Small, MD   Chief Complaint: discuss TEE  History of Present Illness: .    Meghan [name] is a 62 y.o. female with visit-pertinent history of stroke 04/2022, HTN, B12 deficiency seen for pre-TEE visit. She suffered acute stroke in March 2024. She recalls her initial symptoms being dribbling her toothpaste and feeling like her teeth were crisscrossed. She did not receive TNK. CTA negative for vessel stenosis. Started on ASA and Plavix x 3 weeks, then ASA monotherapy after. DVT US was negative and transcranial doppler reflect possible PFO. LDL was 89, started on atorvastatin. 2D echo showed EF 60-65%, mild LVH. F/u bubble study 08/13/22 showed no evidence of IAS. 30d event monitor was negative for afib/flutter. ILR was not covered by patient's insurance. Neurology ordered sleep study which is still pending. Dr. [name] spoke with neurology who recommended proceeding with TEE to evaluate for possible PFO, therefore presents for pre-TEE visit today.  She denies any recent cardiac concerns. No chest pain or SOB. No recurrent stroke symptoms. We discussed what a TEE entails. Her blood pressure was elevated to 146/88 on initial value, recheck 140/80 by me. She reports she has periodically been following it at home and has seen readings that are normal/low, I.e 113 systolic.   Labwork independently reviewed: 06/2022 LDL 41, trig 62, Hgb 11.8, plt 254 04/2022 TSH and A1C wnl, UDS neg, LFTs wnl  ROS: .    Please see the history of present illness.  All other systems are reviewed and otherwise negative.   Studies Reviewed: Marland Kitchen    EKG:  EKG is ordered today, personally reviewed, demonstrating NSR 69bpm, nonspecific TW change in lead III otherwise no acute STT changes, generally low voltage  CV Studies:  Cardiac studies reviewed are outlined and summarized above. Otherwise please see EMR for full report.  Physical Exam:    VS:  BP (!) 140/80   Pulse 74   Ht 5\' 6"  (1.676 m)   Wt 192 lb 3.2 oz (87.2 kg)   SpO2 97%   BMI 31.02 kg/m    Wt Readings from Last 3 Encounters:  09/02/22 192 lb 3.2 oz (87.2 kg)  07/08/22 187 lb 12.8 oz (85.2 kg)  06/30/22 188 lb 6.4 oz (85.5 kg)    GEN: Well nourished, well developed in no acute distress NECK: No JVD; No carotid bruits CARDIAC: RRR, no murmurs, rubs, gallops RESPIRATORY:  Clear to auscultation without rales, wheezing or rhonchi  ABDOMEN: Soft, non-tender, non-distended EXTREMITIES:  No edema; No acute deformity   Asessement and Plan:.    1. Possible PFO with h/o stroke - seen to arrange TEE which has been scheduled for 09/10/22. Procedure discussed and instructions provided. Get pre-procedure BMET/CBC. Continue ASA, atorvastatin. LDL at goal by recheck 06/2022.  Informed Consent   Shared Decision Making/Informed Consent The risks [esophageal damage, perforation (1:10,000 risk), bleeding, pharyngeal hematoma as well as other potential complications associated with conscious sedation including aspiration, arrhythmia, respiratory failure and death], benefits (treatment guidance and diagnostic support) and alternatives of a transesophageal echocardiogram were discussed in detail with Ms. Bartelt and she is willing to proceed.      2. Essential HTN - initial BP  elevated to 146/88 on initial value, recheck 140/80 by me. She reports controlled readings at home periodically  though would suggest more stringent monitoring going forward given h/o stroke. We need a better idea of what it's running at home. The patient was provided instructions on monitoring blood pressure at home for 1 week and relaying results to our office. I also advised she might try taking her amlodipine in the morning to see if this helps. There is certainly room to uptitrate the dose  if needed.  3. Mild LVH - I would suspect this is sequelae of HTN. Dr. [name] did not previously advise any additional workup. We'll have her follow up with him in September as scheduled.  HYPERTENSION CONTROL Vitals:   09/02/22 1435 09/02/22 1546  BP: (!) 146/88 (!) 140/80    The patient's blood pressure is elevated above target today.  In order to address the patient's elevated BP: Blood pressure will be monitored at home to determine if medication changes need to be made.       Disposition: F/u with Dr. [name] as scheduled 10/2022.  Signed, Laurann Montana, PA-C

## 2022-09-01 NOTE — Progress Notes (Unsigned)
Cardiology Office Note    Date:  09/02/2022  ID:  Meghan [name], DOB 1960-12-11, MRN [id number] PCP:  Caro Laroche, DO  Cardiologist:  Orbie Pyo, MD  Electrophysiologist:  Maurice Small, MD   Chief Complaint: discuss TEE  History of Present Illness: .    Meghan [name] is a 62 y.o. female with visit-pertinent history of stroke 04/2022, HTN, B12 deficiency seen for pre-TEE visit. She suffered acute stroke in March 2024. She recalls her initial symptoms being dribbling her toothpaste and feeling like her teeth were crisscrossed. She did not receive TNK. CTA negative for vessel stenosis. Started on ASA and Plavix x 3 weeks, then ASA monotherapy after. DVT US was negative and transcranial doppler reflect possible PFO. LDL was 89, started on atorvastatin. 2D echo showed EF 60-65%, mild LVH. F/u bubble study 08/13/22 showed no evidence of IAS. 30d event monitor was negative for afib/flutter. ILR was not covered by patient's insurance. Neurology ordered sleep study which is still pending. Dr. [name] spoke with neurology who recommended proceeding with TEE to evaluate for possible PFO, therefore presents for pre-TEE visit today.  She denies any recent cardiac concerns. No chest pain or SOB. No recurrent stroke symptoms. We discussed what a TEE entails. Her blood pressure was elevated to 146/88 on initial value, recheck 140/80 by me. She reports she has periodically been following it at home and has seen readings that are normal/low, I.e 113 systolic.   Labwork independently reviewed: 06/2022 LDL 41, trig 62, Hgb 11.8, plt 254 04/2022 TSH and A1C wnl, UDS neg, LFTs wnl  ROS: .    Please see the history of present illness.  All other systems are reviewed and otherwise negative.   Studies Reviewed: Marland Kitchen    EKG:  EKG is ordered today, personally reviewed, demonstrating NSR 69bpm, nonspecific TW change in lead III otherwise no acute STT changes, generally low voltage  CV Studies:  Cardiac studies reviewed are outlined and summarized above. Otherwise please see EMR for full report.  Physical Exam:    VS:  BP (!) 140/80   Pulse 74   Ht 5\' 6"  (1.676 m)   Wt 192 lb 3.2 oz (87.2 kg)   SpO2 97%   BMI 31.02 kg/m    Wt Readings from Last 3 Encounters:  09/02/22 192 lb 3.2 oz (87.2 kg)  07/08/22 187 lb 12.8 oz (85.2 kg)  06/30/22 188 lb 6.4 oz (85.5 kg)    GEN: Well nourished, well developed in no acute distress NECK: No JVD; No carotid bruits CARDIAC: RRR, no murmurs, rubs, gallops RESPIRATORY:  Clear to auscultation without rales, wheezing or rhonchi  ABDOMEN: Soft, non-tender, non-distended EXTREMITIES:  No edema; No acute deformity   Asessement and Plan:.    1. Possible PFO with h/o stroke - seen to arrange TEE which has been scheduled for 09/10/22. Procedure discussed and instructions provided. Get pre-procedure BMET/CBC. Continue ASA, atorvastatin. LDL at goal by recheck 06/2022.  Informed Consent   Shared Decision Making/Informed Consent The risks [esophageal damage, perforation (1:10,000 risk), bleeding, pharyngeal hematoma as well as other potential complications associated with conscious sedation including aspiration, arrhythmia, respiratory failure and death], benefits (treatment guidance and diagnostic support) and alternatives of a transesophageal echocardiogram were discussed in detail with [name] and she is willing to proceed.      2. Essential HTN - initial BP  elevated to 146/88 on initial value, recheck 140/80 by me. She reports controlled readings at home periodically  though would suggest more stringent monitoring going forward given h/o stroke. We need a better idea of what it's running at home. The patient was provided instructions on monitoring blood pressure at home for 1 week and relaying results to our office. I also advised she might try taking her amlodipine in the morning to see if this helps. There is certainly room to uptitrate the dose  if needed.  3. Mild LVH - I would suspect this is sequelae of HTN. Dr. [name] did not previously advise any additional workup. We'll have her follow up with him in September as scheduled.  HYPERTENSION CONTROL Vitals:   09/02/22 1435 09/02/22 1546  BP: (!) 146/88 (!) 140/80    The patient's blood pressure is elevated above target today.  In order to address the patient's elevated BP: Blood pressure will be monitored at home to determine if medication changes need to be made.       Disposition: F/u with Dr. [name] as scheduled 10/2022.  Signed, Laurann Montana, PA-C

## 2022-09-02 ENCOUNTER — Ambulatory Visit: Payer: Commercial Managed Care - HMO | Admitting: Physician Assistant

## 2022-09-02 ENCOUNTER — Encounter: Payer: Self-pay | Admitting: Physician Assistant

## 2022-09-02 ENCOUNTER — Ambulatory Visit: Payer: Commercial Managed Care - HMO

## 2022-09-02 VITALS — BP 140/80 | HR 74 | Ht 66.0 in | Wt 192.2 lb

## 2022-09-02 DIAGNOSIS — Q2112 Patent foramen ovale: Secondary | ICD-10-CM

## 2022-09-02 DIAGNOSIS — Z8673 Personal history of transient ischemic attack (TIA), and cerebral infarction without residual deficits: Secondary | ICD-10-CM

## 2022-09-02 DIAGNOSIS — I639 Cerebral infarction, unspecified: Secondary | ICD-10-CM

## 2022-09-02 DIAGNOSIS — I10 Essential (primary) hypertension: Secondary | ICD-10-CM

## 2022-09-02 DIAGNOSIS — I517 Cardiomegaly: Secondary | ICD-10-CM | POA: Diagnosis not present

## 2022-09-02 DIAGNOSIS — Z13220 Encounter for screening for lipoid disorders: Secondary | ICD-10-CM

## 2022-09-02 NOTE — Patient Instructions (Signed)
Medication Instructions:  Your physician recommends that you continue on your current medications as directed. Please refer to the Current Medication list given to you today.  *If you need a refill on your cardiac medications before your next appointment, please call your pharmacy*   Lab Work: TODAY:  BMET & CBC  If you have labs (blood work) drawn today and your tests are completely normal, you will receive your results only by: MyChart Message (if you have MyChart) OR A paper copy in the mail If you have any lab test that is abnormal or we need to change your treatment, we will call you to review the results.   Testing/Procedures: Your physician has requested that you have a TEE. During a TEE, sound waves are used to create images of your heart. It provides your doctor with information about the size and shape of your heart and how well your heart's chambers and valves are working. In this test, a transducer is attached to the end of a flexible tube that's guided down your throat and into your esophagus (the tube leading from you mouth to your stomach) to get a more detailed image of your heart. You are not awake for the procedure. Please see the instruction sheet given to you today. For further information please visit [email or web address]     Follow-Up: At [facility], you and your health needs are our priority.  As part of our continuing mission to provide you with exceptional heart care, we have created designated Provider Care Teams.  These Care Teams include your primary Cardiologist (physician) and Advanced Practice Providers (APPs -  Physician Assistants and Nurse Practitioners) who all work together to provide you with the care you need, when you need it.  We recommend signing up for the patient portal called "MyChart".  Sign up information is provided on this After Visit Summary.  MyChart is used to connect with patients for Virtual Visits (Telemedicine).  Patients are  able to view lab/test results, encounter notes, upcoming appointments, etc.  Non-urgent messages can be sent to your provider as well.   To learn more about what you can do with MyChart, go to [email or web address].    Your next appointment:   As scheduled   Provider:   Orbie Pyo, MD     Other Instructions We need to get a better idea of what your blood pressure is running at home. Here are some instructions to follow: - I would recommend using a blood pressure cuff that goes on your arm. The wrist ones can be inaccurate. If you're purchasing one for the first time, try to select one that also reports your heart rate because this can be helpful information as well. - To check your blood pressure, choose a time at least 3 hours after taking your blood pressure medicines. If you can sample it at different times of the day, that's great - it might give you more information about how your blood pressure fluctuates. Remain seated in a chair for 5 minutes quietly beforehand, then check it.  - Please record a list of those readings and call us/send in MyChart message with them for our review on Friday or Monday.

## 2022-09-03 LAB — BASIC METABOLIC PANEL
BUN/Creatinine Ratio: 16 (ref 12–28)
BUN: 12 mg/dL (ref 8–27)
CO2: 26 mmol/L (ref 20–29)
Calcium: 10.1 mg/dL (ref 8.7–10.3)
Chloride: 107 mmol/L — ABNORMAL HIGH (ref 96–106)
Creatinine, Ser: 0.74 mg/dL (ref 0.57–1.00)
Glucose: 116 mg/dL — ABNORMAL HIGH (ref 70–99)
Potassium: 4.8 mmol/L (ref 3.5–5.2)
Sodium: 143 mmol/L (ref 134–144)
eGFR: 91 mL/min/{1.73_m2} (ref >59)

## 2022-09-03 LAB — CBC
Hematocrit: 36.7 % (ref 34.0–46.6)
Hemoglobin: 11.7 g/dL (ref 11.1–15.9)
MCH: 28.5 pg (ref 26.6–33.0)
MCHC: 31.9 g/dL (ref 31.5–35.7)
MCV: 89 fL (ref 79–97)
Platelets: 264 10*3/uL (ref 150–450)
RBC: 4.11 x10E6/uL (ref 3.77–5.28)
RDW: 13.7 % (ref 11.7–15.4)
WBC: 6.2 10*3/uL (ref 3.4–10.8)

## 2022-09-09 NOTE — Progress Notes (Unsigned)
    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 Recommended repeat PAP in 1 year.  Hypertension, possible PFO, h/o stroke: - followed by Cardiology  - Medications: lipitor, amlodipine (decreased to 1/2 tab at last visit in hopes of d/c) - Compliance: good, didn't take today due to procedure (beet root, hibiscus) - Checking BP at home: yes, unsure of numbers, ?130s SBP - sleep study pending - had TEE this am to determine presence of PFO given h/o stroke and prior transcranial doppler results.   Ankle pain - come and goes, worse with walking. H/o fracture 2015, coming down steps, walked on it for a while. Wore boot for about a year. Saw podiatry a few years after.  No new injury.  B12 deficiency - notable improvement in fatigue with B12 injections. Most recent check 06/2022 in normal range.   OBJECTIVE:   BP (!) 146/76   Pulse 62   Ht 5' 6.5" (1.689 m)   Wt 191 lb 12.8 oz (87 kg)   SpO2 100%   BMI 30.49 kg/m   Gen: well appearing, in NAD Card: RRR Lungs: CTAB Ext: WWP. Slight swelling noted to lateral ankle, slightly TTP. TTP along achilles calcaneal insertion.   ASSESSMENT/PLAN:   Essential hypertension Elevated today, did not take meds given procedure this morning. Unsure of measurements at home, instructed to keep log and return for follow-up in 1-2 months.  Hopeful to have sleep study results at that time.  Recent labs reviewed.  Vitamin B 12 deficiency Improved with monthly B12 IM injections, continue.  Abnormal Pap smear of cervix Due for repeat Pap, encouraged to schedule.  Ankle pain Intermittent with occasional swelling. S/p fracture in 2015 and previously saw podiatry.  No new injury.  No current concern for repeat fracture.  Likely subsequent arthritis versus overuse tendinitis contributing to swelling and occasional pain, will obtain x-ray to evaluate further.  Cerebrovascular accident  (CVA) [facility]) Concern for possible PFO on recent bubble study.  Had TEE this morning, will follow-up results.     Caro Laroche, DO

## 2022-09-09 NOTE — Anesthesia Preprocedure Evaluation (Addendum)
Anesthesia Evaluation  Patient identified by MRN, date of birth, ID band Patient awake    Reviewed: Allergy & Precautions, NPO status , Patient's Chart, lab work & pertinent test results  History of Anesthesia Complications (+) MALIGNANT HYPERTHERMIA and history of anesthetic complications  Airway Mallampati: II  TM Distance: >3 FB Neck ROM: Full    Dental  (+) Teeth Intact, Dental Advisory Given   Pulmonary asthma , former smoker   Pulmonary exam normal breath sounds clear to auscultation       Cardiovascular hypertension, Pt. on medications Normal cardiovascular exam Rhythm:Regular Rate:Normal  Echo 04/18/22:  1. Left ventricular ejection fraction, by estimation, is 60 to 65%. The  left ventricle has normal function. The left ventricle has no regional  wall motion abnormalities. There is mild left ventricular hypertrophy.  Left ventricular diastolic parameters  were normal.   2. Right ventricular systolic function is normal. The right ventricular  size is normal. There is normal pulmonary artery systolic pressure.   3. The mitral valve is normal in structure. No evidence of mitral valve  regurgitation. No evidence of mitral stenosis.   4. The aortic valve is normal in structure. Aortic valve regurgitation is  not visualized. No aortic stenosis is present.   5. The inferior vena cava is normal in size with greater than 50%  respiratory variability, suggesting right atrial pressure of 3 mmHg.      Neuro/Psych  Headaches CVA    GI/Hepatic Neg liver ROS,GERD  ,,  Endo/Other  negative endocrine ROS    Renal/GU negative Renal ROS     Musculoskeletal negative musculoskeletal ROS (+)    Abdominal   Peds  Hematology negative hematology ROS (+)   Anesthesia Other Findings   Reproductive/Obstetrics                             Anesthesia Physical Anesthesia Plan  ASA: 3  Anesthesia Plan:  MAC   Post-op Pain Management:    Induction: Intravenous  PONV Risk Score and Plan: 3 and TIVA  Airway Management Planned: Natural Airway and Simple Face Mask  Additional Equipment:   Intra-op Plan:   Post-operative Plan:   Informed Consent: I have reviewed the patients History and Physical, chart, labs and discussed the procedure including the risks, benefits and alternatives for the proposed anesthesia with the patient or authorized representative who has indicated his/her understanding and acceptance.     Dental advisory given  Plan Discussed with: CRNA  Anesthesia Plan Comments:         Anesthesia Quick Evaluation

## 2022-09-09 NOTE — Pre-Procedure Instructions (Signed)
Spoke to patient on phone regarding procedure tomorrow.  Instructed to arrive at 645 am , NPO after midnight.  Confirmed patient has a ride home and responsible person to stay with patient for 24 hours after the procedure  Take BP meds in the AM with a sip of water

## 2022-09-10 ENCOUNTER — Ambulatory Visit: Admission: RE | Admit: 2022-09-10 | Payer: Commercial Managed Care - HMO | Source: Ambulatory Visit

## 2022-09-10 ENCOUNTER — Ambulatory Visit (HOSPITAL_COMMUNITY)
Admission: RE | Admit: 2022-09-10 | Discharge: 2022-09-10 | Disposition: A | Discharge status: Home / Self Care | Payer: Commercial Managed Care - HMO | Attending: Cardiology | Admitting: Cardiology

## 2022-09-10 ENCOUNTER — Encounter (HOSPITAL_COMMUNITY)
Admission: RE | Disposition: A | Discharge status: Home / Self Care | Payer: Self-pay | Source: Home / Self Care | Attending: Cardiology

## 2022-09-10 ENCOUNTER — Encounter: Payer: Self-pay | Admitting: Family Medicine

## 2022-09-10 ENCOUNTER — Other Ambulatory Visit: Payer: Self-pay

## 2022-09-10 ENCOUNTER — Ambulatory Visit (INDEPENDENT_AMBULATORY_CARE_PROVIDER_SITE_OTHER): Payer: Commercial Managed Care - HMO | Admitting: Family Medicine

## 2022-09-10 ENCOUNTER — Ambulatory Visit (HOSPITAL_COMMUNITY): Payer: Self-pay | Admitting: Anesthesiology

## 2022-09-10 ENCOUNTER — Ambulatory Visit (HOSPITAL_BASED_OUTPATIENT_CLINIC_OR_DEPARTMENT_OTHER): Payer: Commercial Managed Care - HMO

## 2022-09-10 ENCOUNTER — Ambulatory Visit (HOSPITAL_BASED_OUTPATIENT_CLINIC_OR_DEPARTMENT_OTHER): Payer: Commercial Managed Care - HMO | Admitting: Anesthesiology

## 2022-09-10 VITALS — BP 146/76 | HR 62 | Ht 66.5 in | Wt 191.8 lb

## 2022-09-10 DIAGNOSIS — M25571 Pain in right ankle and joints of right foot: Secondary | ICD-10-CM | POA: Diagnosis not present

## 2022-09-10 DIAGNOSIS — Z8673 Personal history of transient ischemic attack (TIA), and cerebral infarction without residual deficits: Secondary | ICD-10-CM | POA: Diagnosis not present

## 2022-09-10 DIAGNOSIS — I10 Essential (primary) hypertension: Secondary | ICD-10-CM

## 2022-09-10 DIAGNOSIS — R87618 Other abnormal cytological findings on specimens from cervix uteri: Secondary | ICD-10-CM

## 2022-09-10 DIAGNOSIS — E538 Deficiency of other specified B group vitamins: Secondary | ICD-10-CM | POA: Diagnosis not present

## 2022-09-10 DIAGNOSIS — Z7982 Long term (current) use of aspirin: Secondary | ICD-10-CM | POA: Insufficient documentation

## 2022-09-10 DIAGNOSIS — M25572 Pain in left ankle and joints of left foot: Secondary | ICD-10-CM | POA: Diagnosis not present

## 2022-09-10 DIAGNOSIS — I639 Cerebral infarction, unspecified: Secondary | ICD-10-CM

## 2022-09-10 DIAGNOSIS — Z79899 Other long term (current) drug therapy: Secondary | ICD-10-CM | POA: Diagnosis not present

## 2022-09-10 DIAGNOSIS — Q2112 Patent foramen ovale: Secondary | ICD-10-CM | POA: Diagnosis not present

## 2022-09-10 DIAGNOSIS — Z87891 Personal history of nicotine dependence: Secondary | ICD-10-CM

## 2022-09-10 DIAGNOSIS — M25579 Pain in unspecified ankle and joints of unspecified foot: Secondary | ICD-10-CM | POA: Insufficient documentation

## 2022-09-10 DIAGNOSIS — J4520 Mild intermittent asthma, uncomplicated: Secondary | ICD-10-CM

## 2022-09-10 DIAGNOSIS — G8929 Other chronic pain: Secondary | ICD-10-CM | POA: Diagnosis not present

## 2022-09-10 HISTORY — PX: TEE WITHOUT CARDIOVERSION: SHX5443

## 2022-09-10 LAB — ECHO TEE

## 2022-09-10 SURGERY — ECHOCARDIOGRAM, TRANSESOPHAGEAL
Anesthesia: Monitor Anesthesia Care

## 2022-09-10 MED ORDER — PROPOFOL 500 MG/50ML IV EMUL
INTRAVENOUS | Status: DC | PRN
  Administered 2022-09-10: 70 ug/kg/min via INTRAVENOUS

## 2022-09-10 MED ORDER — SODIUM CHLORIDE 0.9 % IV SOLN
INTRAVENOUS | Status: DC
  Administered 2022-09-10: 20 mL/h via INTRAVENOUS

## 2022-09-10 MED ORDER — PROPOFOL 10 MG/ML IV BOLUS
INTRAVENOUS | Status: DC | PRN
  Administered 2022-09-10 (×2): 30 mg via INTRAVENOUS

## 2022-09-10 NOTE — Transfer of Care (Signed)
Immediate Anesthesia Transfer of Care Note  Patient: Meghan [name]  Procedure(s) Performed: TRANSESOPHAGEAL ECHOCARDIOGRAM  Patient Location: Cath Lab  Anesthesia Type:MAC  Level of Consciousness: sedated  Airway & Oxygen Therapy: Patient connected to nasal cannula oxygen  Post-op Assessment: Post -op Vital signs reviewed and stable  Post vital signs: stable  Last Vitals:  Vitals Value Taken Time  BP    Temp    Pulse    Resp    SpO2      Last Pain:  Vitals:   09/10/22 0702  TempSrc: Temporal         Complications: No notable events documented.

## 2022-09-10 NOTE — Patient Instructions (Signed)
It was great to see you!  Our plans for today:  - We are getting xrays of your ankles.  - Make an appointment soon (1-2 months) for pap smear, blood pressure follow up.   Take care and seek immediate care sooner if you develop any concerns.   Dr. Linwood Dibbles

## 2022-09-10 NOTE — Assessment & Plan Note (Addendum)
Intermittent with occasional swelling. S/p fracture in 2015 and previously saw podiatry.  No new injury.  No current concern for repeat fracture.  Likely subsequent arthritis versus overuse tendinitis contributing to swelling and occasional pain, will obtain x-ray to evaluate further.

## 2022-09-10 NOTE — Assessment & Plan Note (Signed)
Concern for possible PFO on recent bubble study.  Had TEE this morning, will follow-up results.

## 2022-09-10 NOTE — Anesthesia Postprocedure Evaluation (Signed)
Anesthesia Post Note  Patient: Meghan [name]  Procedure(s) Performed: TRANSESOPHAGEAL ECHOCARDIOGRAM     Patient location during evaluation: Endoscopy Anesthesia Type: MAC Level of consciousness: awake and alert Pain management: pain level controlled Vital Signs Assessment: post-procedure vital signs reviewed and stable Respiratory status: spontaneous breathing, nonlabored ventilation, respiratory function stable and patient connected to nasal cannula oxygen Cardiovascular status: stable and blood pressure returned to baseline Postop Assessment: no apparent nausea or vomiting Anesthetic complications: no   No notable events documented.  Last Vitals:  Vitals:   09/10/22 0840 09/10/22 0851  BP: (!) 145/74   Pulse: (!) 54   Resp: 18 18  Temp: 36.7 C   SpO2: 96%     Last Pain:  Vitals:   09/10/22 0840  TempSrc: Temporal  PainSc: 0-No pain                 Collene Schlichter

## 2022-09-10 NOTE — Progress Notes (Signed)
  Echocardiogram Echocardiogram Transesophageal has been performed.  Milda Smart 09/10/2022, 8:34 AM

## 2022-09-10 NOTE — Progress Notes (Deleted)
  Echocardiogram 2D Echocardiogram has been performed.  Milda Smart 09/10/2022, 8:31 AM

## 2022-09-10 NOTE — Assessment & Plan Note (Signed)
Due for repeat Pap, encouraged to schedule.

## 2022-09-10 NOTE — Assessment & Plan Note (Addendum)
Elevated today, did not take meds given procedure this morning. Unsure of measurements at home, instructed to keep log and return for follow-up in 1-2 months.  Hopeful to have sleep study results at that time.  Recent labs reviewed.

## 2022-09-10 NOTE — Assessment & Plan Note (Signed)
Improved with monthly B12 IM injections, continue.

## 2022-09-10 NOTE — CV Procedure (Signed)
     TRANSESOPHAGEAL ECHOCARDIOGRAM   NAME:  Meghan [name]   MRN: [id number] DOB:  [birth year]   ADMIT DATE: 09/10/2022  INDICATIONS: CVA  PROCEDURE:   Informed consent was obtained prior to the procedure. The risks, benefits and alternatives for the procedure were discussed and the patient comprehended these risks.  Risks include, but are not limited to, cough, sore throat, vomiting, nausea, somnolence, esophageal and stomach trauma or perforation, bleeding, low blood pressure, aspiration, pneumonia, infection, trauma to the teeth and death.    After a procedural time-out, the oropharynx was anesthetized and the patient was sedated by the anesthesia service. The transesophageal probe was inserted in the esophagus and stomach without difficulty and multiple views were obtained. Anesthesia was monitored by Ginette Otto, CRNA.    COMPLICATIONS:    There were no immediate complications.  FINDINGS: Positive bubble study, with few bubbles seen in right atrium within 3-6 cardiac cycles suggesting small PFO    Epifanio Lesches MD [facility]  [address] [place], [place] [address] [phone]   11:29 AM

## 2022-09-10 NOTE — Interval H&P Note (Signed)
History and Physical Interval Note:  09/10/2022 7:46 AM  Meghan [name]  has presented today for surgery, with the diagnosis of PFO.  The various methods of treatment have been discussed with the patient and family. After consideration of risks, benefits and other options for treatment, the patient has consented to  Procedure(s): TRANSESOPHAGEAL ECHOCARDIOGRAM (N/A) as a surgical intervention.  The patient's history has been reviewed, patient examined, no change in status, stable for surgery.  I have reviewed the patient's chart and labs.  Questions were answered to the patient's satisfaction.     Little Ishikawa

## 2022-09-11 ENCOUNTER — Encounter (HOSPITAL_COMMUNITY): Payer: Self-pay | Admitting: Cardiology

## 2022-09-23 ENCOUNTER — Telehealth: Payer: Self-pay | Admitting: Internal Medicine

## 2022-09-23 NOTE — Telephone Encounter (Signed)
Patient is requesting to speak with a nurse in regards to Echo results.

## 2022-09-23 NOTE — Telephone Encounter (Signed)
Called patient.  She read the results of the TEE.  She's concerned that she could get another stroke and/or what would she look for as far as if it happens again.  I moved her appointment up a few days with Dr. [name] to review her concerns.  She is appreciative for discussion today/assistance.

## 2022-10-02 ENCOUNTER — Telehealth: Payer: Self-pay | Admitting: Internal Medicine

## 2022-10-02 NOTE — Telephone Encounter (Signed)
Called and spoke w patient.  She wants to know if she can have a video visit for her next appointment Monday w Dr. [name].  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    TEE results 09/10/22:   Your ultrasound down the throat showed a very small amount of bubbles moving through a small hole in the heart.  We will discuss when I see you next time.  Written by Orbie Pyo, MD on 09/10/2022  3:59 PM ED

## 2022-10-02 NOTE — Telephone Encounter (Signed)
Sent message okay to change to virtual and consent to MyChart.  Appointment updated.

## 2022-10-02 NOTE — Telephone Encounter (Signed)
Pt has an upcoming appt on 8/26 and is requesting a callback regarding her appt being a virtual appt. Please advise

## 2022-10-05 NOTE — Progress Notes (Unsigned)
Cardiology Office Note:    Date:  10/06/2022   ID:  Meghan [name], DOB [birth year], MRN [id number]  PCP:  Caro Laroche, DO   [facility] Providers Cardiologist:  Alverda Skeans, MD Referring MD: Caro Laroche, DO   Chief Complaint/Reason for Referral: PFO in the context of stroke  I connected with  Meghan [name] on 10/06/22 by a video enabled telemedicine application and verified that I am speaking with the correct person using two identifiers.   I discussed the limitations of evaluation and management by telemedicine. The patient expressed understanding and agreed to proceed.   ASSESSMENT:    1. Acute CVA (cerebrovascular accident) (HCC)   2. PFO (patent foramen ovale)   3. Hyperlipidemia LDL goal <55      PLAN:    In order of problems listed above: 1.  Stroke: The patient has an intermediate ROPE score.  While her bubble study echocardiogram and TCD were positive her TEE demonstrated a very small PFO without high risk anatomy such as an intra-atrial septal aneurysm or large shunt.  Given her multiple risk factors including hypertension, hyperlipidemia, prior smoking, and prediabetes I think that the patient's PFO was likely incidental and closure should not be pursued.  I will reach out to Dr. Roda Shutters to see if he has a different opinion. 2.  Hypertension: Continue current medical therapy with goal blood pressure of less than 130/80 mmHg. 3.  Hyperlipidemia: Continue statin.  Goal LDL is less than 55 given her history of stroke.  Her LDL in May was 24.        Dispo:  Return in about 6 months (around 04/08/2023).     Medication Adjustments/Labs and Tests Ordered: Current medicines are reviewed at length with the patient today.  Concerns regarding medicines are outlined above.  The following changes have been made:  no change   Labs/tests ordered: No orders of the defined types were placed in this encounter.   Medication Changes: No orders of the defined  types were placed in this encounter.    Current medicines are reviewed at length with the patient today.  The patient does not have concerns regarding medicines.   History of Present Illness:    FOCUSED PROBLEM LIST:   1.  Acute stroke March 2024 with MRI demonstrating small embolic infarctions of the left posterior frontal lobe; did not receive TNK.  CTA was negative for large vessel occlusion and DVT was negative for extremity DVT.  Transcranial Doppler did suggest a small PFO; ROPE score of 5 2.  Hypertension 3.  Hyperlipidemia 4.  Prediabetes 5.  Remote 15 p-y smoking history; quit 1997  May 2024: The patient is a 62 y.o. female with the indicated medical history here for recommendations regarding therapy in the context of a small PFO and history of stroke.  The patient was in her normal state of health up until early March when she presented with an acute stroke.  Her imaging studies were significant for multiple small left posterior frontal lobe infarctions.  Her CTA and lower extremity ultrasound were negative.  Transcranial Doppler suggested a small PFO.  An echocardiogram demonstrated normal LV function with no significant valvular abnormalities.  A bubble study echo was not performed.  She was discharged home on dual antiplatelet therapy and atorvastatin.  There were plans for implantable Linq monitor however this was never performed.  She fortunately tells me that she is not developed any recurrent signs or symptoms of stroke.  She  works full-time and owns a [employer].  She has been completely compliant with her medications.  She denies any palpitations, anginal symptoms, presyncope, or syncope.  Plan: Obtain 30-day monitor and bubble study echocardiogram.  August 2020 for telephone visit: The patient's 30-day monitor demonstrated no atrial fibrillation.  A bubble study echocardiogram was positive for intracardiac shunt.  She was referred for transesophageal echocardiogram which showed a  very small PFO without high risk anatomy or a large shunt.  The patient is doing well.  She has fortunately had no recurrent signs or symptoms of stroke.  Her LDL in the interim was under 55.  She is tolerating her medical therapy well without myalgias or arthralgias, severe bleeding or bruising, or any other issues.  She has been completely compliant with her medical therapy.        Current Medications: Current Meds  Medication Sig   albuterol (VENTOLIN HFA) 108 (90 Base) MCG/ACT inhaler INHALE 2 PUFFS INTO LUNGS EVERY 6 HOURS AS NEEDED FOR WHEEZING FOR SHORTNESS OF BREATH   amLODipine (NORVASC) 5 MG tablet Take 0.5 tablets (2.5 mg total) by mouth at bedtime.   aspirin EC 81 MG tablet Take 1 tablet (81 mg total) by mouth daily. Swallow whole.   atorvastatin (LIPITOR) 20 MG tablet Take 1 tablet (20 mg total) by mouth daily.   Blood Pressure Monitoring (3 SERIES BP MONITOR/UPPER ARM) DEVI Use as directed   cetirizine (ZYRTEC) 10 MG tablet Take 10 mg by mouth daily as needed for allergies.   Cyanocobalamin (B-12 COMPLIANCE INJECTION) 1000 MCG/ML KIT Inject 1,000 mcg as directed every 30 (thirty) days.   EPINEPHrine 0.3 mg/0.3 mL IJ SOAJ injection Inject 0.3 mg into the muscle as needed for anaphylaxis.   GINSENG KOREAN PO Take 1 tablet by mouth daily. Ginseng Panax   MAGNESIUM PO Take 1 tablet by mouth 2 (two) times daily. Magnesium Taurate   Misc Natural Products (GREEN TEA) TABS Take 1 tablet by mouth daily.   Multiple Vitamins-Minerals (HAIR SKIN & NAILS) TABS Take 1 tablet by mouth daily.     Allergies:    Epinephrine, Shellfish allergy, Codeine, Latex, Lidocaine, Metronidazole, Morphine, Other, Oxycodone, Oxycodone-acetaminophen, and Sulfonamide derivatives   Social History:   Social History   Tobacco Use   Smoking status: Former    Current packs/day: 0.00    Types: Cigarettes    Quit date: 09/13/1995    Years since quitting: 27.0   Smokeless tobacco: Never  Vaping Use   Vaping  status: Never Used  Substance Use Topics   Alcohol use: Yes    Alcohol/week: 0.5 standard drinks of alcohol    Types: 1 drink(s) per week   Drug use: No     Family Hx: Family History  Problem Relation Age of Onset   Hypertension Mother    Kidney disease Mother    Cholecystitis Mother    Hypertension Father    Diabetes Father    Kidney disease Daughter      Review of Systems:   Please see the history of present illness.    All other systems reviewed and are negative.     EKGs/Labs/Other Test Reviewed:    EKG:  EKG performed March 2024 that I personally reviewed demonstrates sinus rhythm with sinus arrhythmia.  Prior CV studies:  Cardiac Studies & Procedures       ECHOCARDIOGRAM  ECHOCARDIOGRAM LIMITED BUBBLE STUDY 08/13/2022  Narrative ECHOCARDIOGRAM LIMITED REPORT    Patient Name:   Meghan [name] Date of Exam:  08/13/2022 Medical Rec #:  [id number]        Height:       66.0 in Accession #:    [id number]       Weight:       187.8 lb Date of Birth:  1960-03-17       BSA:          1.947 m Patient Age:    61 years         BP:           130/85 mmHg Patient Gender: F                HR:           63 bpm. Exam Location:  [facility]  Procedure: Limited Echo, Limited Color Doppler, Cardiac Doppler and Saline Contrast Bubble Study  Indications:    I63.9 CVA  History:        Patient has prior history of Echocardiogram examinations, most recent 04/18/2022. Stroke; Risk Factors:Hypertension.  Sonographer:    Samule Ohm RDCS Referring Phys: [id number] Orbie Pyo  IMPRESSIONS   1. Left ventricular ejection fraction, by estimation, is 60 to 65%. The left ventricle has normal function. 2. Right ventricular systolic function is normal. The right ventricular size is normal. 3. Agitated saline contrast bubble study was negative, with no evidence of any interatrial shunt.  FINDINGS Left Ventricle: Left ventricular ejection fraction, by estimation, is 60 to 65%.  The left ventricle has normal function.  Right Ventricle: The right ventricular size is normal. Right ventricular systolic function is normal.  IAS/Shunts: Agitated saline contrast was given intravenously to evaluate for intracardiac shunting. Agitated saline contrast bubble study was negative, with no evidence of any interatrial shunt.  LEFT VENTRICLE PLAX 2D LVIDd:         4.30 cm   Diastology LVIDs:         2.40 cm   LV e' medial:    8.81 cm/s LV PW:         1.00 cm   LV E/e' medial:  8.9 LV IVS:        1.20 cm   LV e' lateral:   11.30 cm/s LVOT diam:     1.60 cm   LV E/e' lateral: 6.9 LV SV:         54 LV SV Index:   28 LVOT Area:     2.01 cm   LEFT ATRIUM         Index       RIGHT ATRIUM LA diam:    4.10 cm 2.11 cm/m  RA Pressure: 3.00 mmHg AORTIC VALVE LVOT Vmax:   117.00 cm/s LVOT Vmean:  77.900 cm/s LVOT VTI:    0.269 m  AORTA Ao Root diam: 2.80 cm Ao Asc diam:  3.30 cm  MITRAL VALVE                TRICUSPID VALVE MV Area (PHT): 2.22 cm     Estimated RAP:  3.00 mmHg MV Decel Time: 342 msec MV E velocity: 78.10 cm/s   SHUNTS MV A velocity: 122.00 cm/s  Systemic VTI:  0.27 m MV E/A ratio:  0.64         Systemic Diam: 1.60 cm  Carolan Clines Electronically signed by Carolan Clines Signature Date/Time: 08/13/2022/5:58:39 PM    Final   TEE  ECHO TEE 09/10/2022  Narrative TRANSESOPHOGEAL ECHO REPORT    Patient Name:   Meghan [name] Date of  Exam: 09/10/2022 Medical Rec #:  [id number]        Height:       66.0 in Accession #:    [id number]       Weight:       192.2 lb Date of Birth:  [birth year]       BSA:          1.966 m Patient Age:    61 years         BP:           144/78 mmHg Patient Gender: F                HR:           85 bpm. Exam Location:  Inpatient  Procedure: Transesophageal Echo, Cardiac Doppler, Color Doppler and Saline Contrast Bubble Study  Indications:    PFO CVA  History:        Patient has prior history of Echocardiogram examinations,  most recent 08/13/2022. Malignant hyperthermia.  Sonographer:    Milda Smart Referring Phys: 76 DAYNA N DUNN  PROCEDURE: After discussion of the risks and benefits of a TEE, an informed consent was obtained from the patient. TEE procedure time was 15 minutes. The transesophogeal probe was passed without difficulty through the esophogus of the patient. Imaged were obtained with the patient in a left lateral decubitus position. Sedation performed by different physician. The patient was monitored while under deep sedation. Anesthestetic sedation was provided intravenously by Anesthesiology: 251.84mg  of Propofol. Image quality was good. The patient's vital signs; including heart rate, blood pressure, and oxygen saturation; remained stable throughout the procedure. The patient developed no complications during the procedure.  IMPRESSIONS   1. Left ventricular ejection fraction, by estimation, is 60 to 65%. The left ventricle has normal function. 2. No left atrial/left atrial appendage thrombus was detected. 3. Right ventricular systolic function is normal. The right ventricular size is normal. 4. The mitral valve is normal in structure. Trivial mitral valve regurgitation. 5. The aortic valve is tricuspid. Aortic valve regurgitation is not visualized. No aortic stenosis is present. 6. Agitated saline contrast bubble study was positive with shunting observed within 3-6 cardiac cycles suggestive of interatrial shunt. A few bubbles were seen in right atrium within 3-6 cardiac cycles, suggesting small PFO.  FINDINGS Left Ventricle: Left ventricular ejection fraction, by estimation, is 60 to 65%. The left ventricle has normal function. The left ventricular internal cavity size was normal in size.  Right Ventricle: The right ventricular size is normal. No increase in right ventricular wall thickness. Right ventricular systolic function is normal.  Left Atrium: Left atrial size was normal in size. No  left atrial/left atrial appendage thrombus was detected.  Right Atrium: Right atrial size was normal in size.  Pericardium: There is no evidence of pericardial effusion.  Mitral Valve: The mitral valve is normal in structure. Trivial mitral valve regurgitation.  Tricuspid Valve: The tricuspid valve is normal in structure. Tricuspid valve regurgitation is trivial.  Aortic Valve: The aortic valve is tricuspid. Aortic valve regurgitation is not visualized. No aortic stenosis is present.  Pulmonic Valve: The pulmonic valve was grossly normal. Pulmonic valve regurgitation is not visualized.  Aorta: The aortic root and ascending aorta are structurally normal, with no evidence of dilitation.  IAS/Shunts: No atrial level shunt detected by color flow Doppler. Agitated saline contrast was given intravenously to evaluate for intracardiac shunting. Agitated saline contrast bubble study was positive with shunting observed within 3-6 cardiac cycles suggestive  of interatrial shunt.  Additional Comments: Spectral Doppler performed.  LEFT VENTRICLE PLAX 2D LVOT diam:     2.10 cm LVOT Area:     3.46 cm    AORTA Ao Root diam: 2.80 cm Ao Asc diam:  3.10 cm   SHUNTS Systemic Diam: 2.10 cm  Epifanio Lesches MD Electronically signed by Epifanio Lesches MD Signature Date/Time: 09/10/2022/11:28:57 AM    Final   MONITORS  CARDIAC EVENT MONITOR 06/17/2022  Narrative 1.  Total monitoring time of 30 days. 2.  Average heart rate 63 bpm ranging from 40 to 239 bpm. 3.  No pauses, atrial fibrillation, or ventricular tachycardia.           Other studies Reviewed: Review of the additional studies/records demonstrates: CTA, MRI, and transcranial Doppler  Recent Labs: 04/18/2022: ALT 16; TSH 1.257 09/02/2022: BUN 12; Creatinine, Ser 0.74; Hemoglobin 11.7; Platelets 264; Potassium 4.8; Sodium 143   Recent Lipid Panel Lab Results  Component Value Date/Time   CHOL 101 07/08/2022 12:34 PM    TRIG 62 07/08/2022 12:34 PM   HDL 46 07/08/2022 12:34 PM   LDLCALC 41 07/08/2022 12:34 PM    Risk Assessment/Calculations:             Physical Exam:    VS: Telehealth visit Wt Readings from Last 3 Encounters:  09/10/22 191 lb 12.8 oz (87 kg)  09/02/22 192 lb 3.2 oz (87.2 kg)  07/08/22 187 lb 12.8 oz (85.2 kg)    No exam due to telehealth visit  Signed, Orbie Pyo, MD  10/06/2022 3:36 PM    [facility] [address] [place], [place], [place]  [address] Phone: [phone]; Fax: [phone]   Note:  This document was prepared using Dragon voice recognition software and may include unintentional dictation errors.

## 2022-10-06 ENCOUNTER — Ambulatory Visit: Payer: Commercial Managed Care - HMO | Attending: Internal Medicine | Admitting: Internal Medicine

## 2022-10-06 ENCOUNTER — Encounter: Payer: Self-pay | Admitting: Internal Medicine

## 2022-10-06 VITALS — BP 140/85

## 2022-10-06 DIAGNOSIS — Q2112 Patent foramen ovale: Secondary | ICD-10-CM | POA: Diagnosis not present

## 2022-10-06 DIAGNOSIS — I639 Cerebral infarction, unspecified: Secondary | ICD-10-CM | POA: Diagnosis not present

## 2022-10-06 DIAGNOSIS — E785 Hyperlipidemia, unspecified: Secondary | ICD-10-CM | POA: Diagnosis not present

## 2022-10-06 NOTE — Patient Instructions (Signed)
Medication Instructions:  The current medical regimen is effective;  continue present plan and medications.  *If you need a refill on your cardiac medications before your next appointment, please call your pharmacy*  Follow-Up: At [facility], you and your health needs are our priority.  As part of our continuing mission to provide you with exceptional heart care, we have created designated Provider Care Teams.  These Care Teams include your primary Cardiologist (physician) and Advanced Practice Providers (APPs -  Physician Assistants and Nurse Practitioners) who all work together to provide you with the care you need, when you need it.  We recommend signing up for the patient portal called "MyChart".  Sign up information is provided on this After Visit Summary.  MyChart is used to connect with patients for Virtual Visits (Telemedicine).  Patients are able to view lab/test results, encounter notes, upcoming appointments, etc.  Non-urgent messages can be sent to your provider as well.   To learn more about what you can do with MyChart, go to [email or web address].    Your next appointment:   6 month(s)  Provider:   Jari Favre, PA-C, Ronie Spies, PA-C, Robin Searing, NP, Jacolyn Reedy, PA-C, Eligha Bridegroom, NP, or Tereso Newcomer, PA-C

## 2022-10-08 IMAGING — MG MM DIGITAL SCREENING BILAT W/ TOMO AND CAD
8 series · 8 of 24 positions shown · non-contrast
Comparison: Previous exam(s).

CLINICAL DATA: Screening.

EXAM:
DIGITAL SCREENING BILATERAL MAMMOGRAM WITH TOMOSYNTHESIS AND CAD
TECHNIQUE: Bilateral screening digital craniocaudal and mediolateral oblique
mammograms were obtained. Bilateral screening digital breast
tomosynthesis was performed. The images were evaluated with
computer-aided detection.

[L MLO synth-2D]
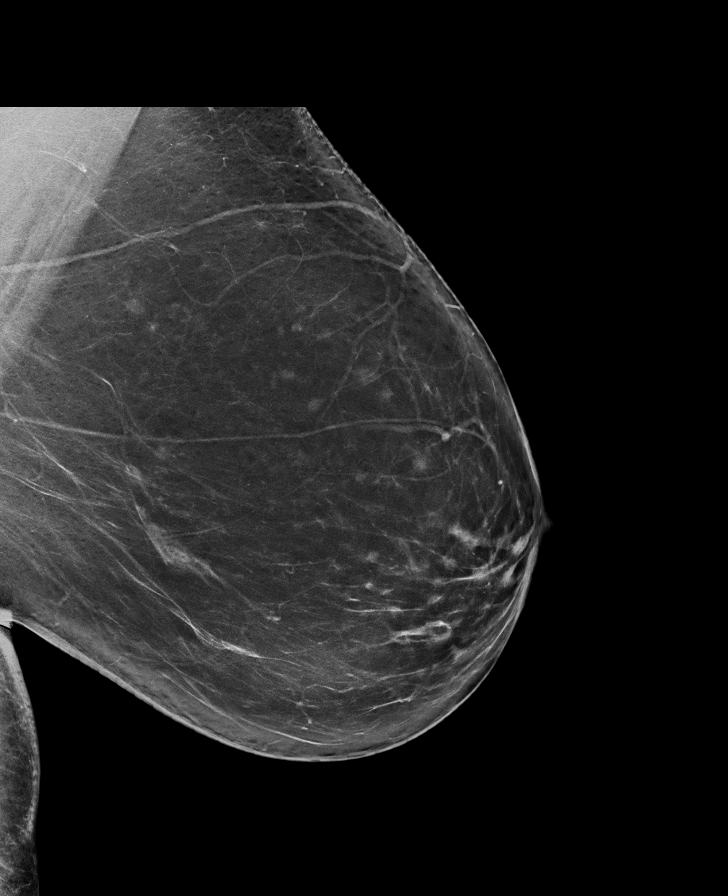

[R CC synth-2D]
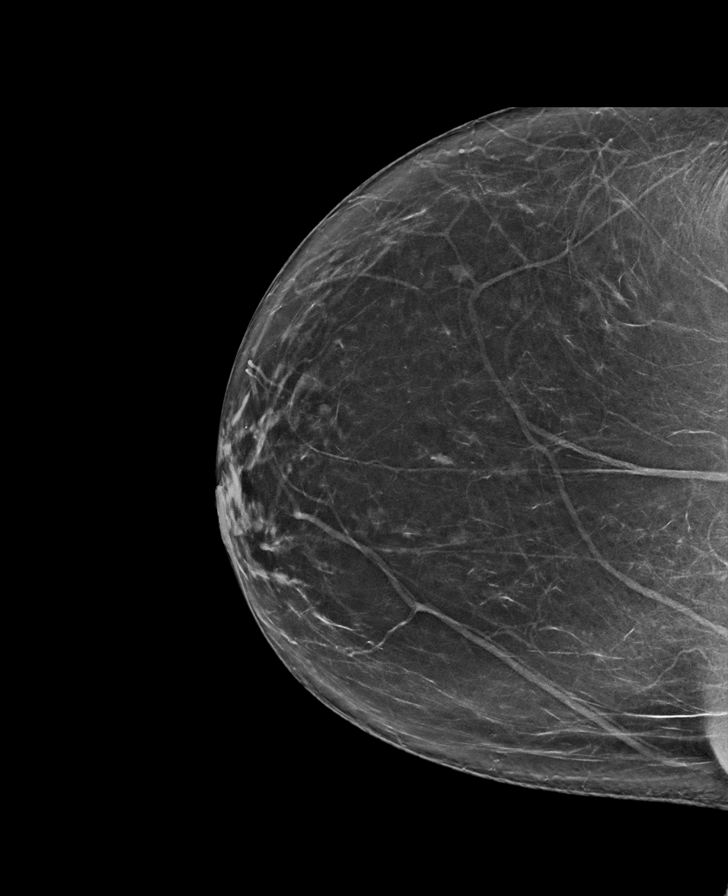

[L CC synth-2D]
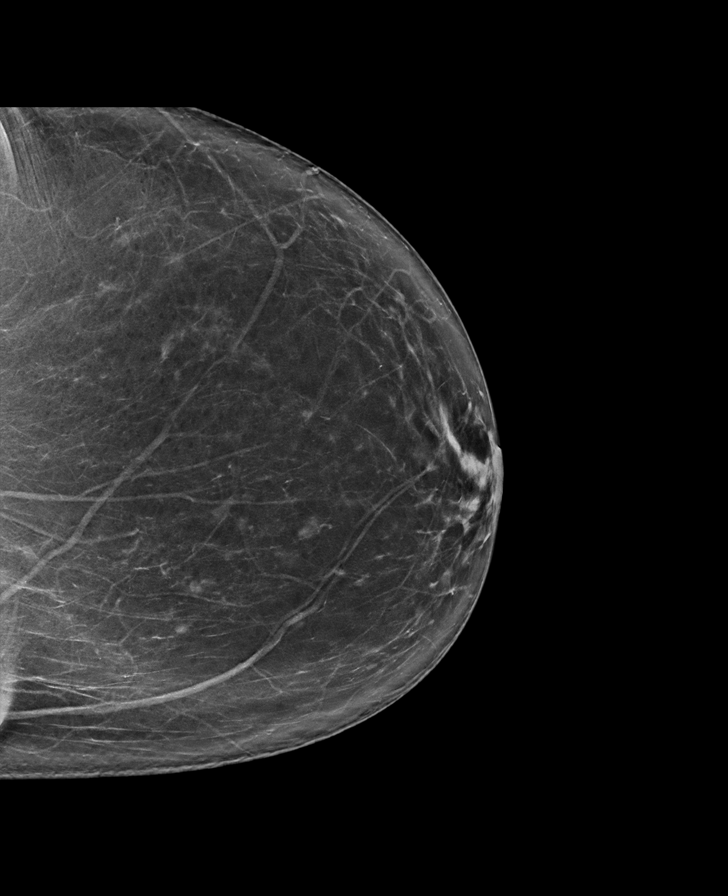

[R MLO synth-2D]
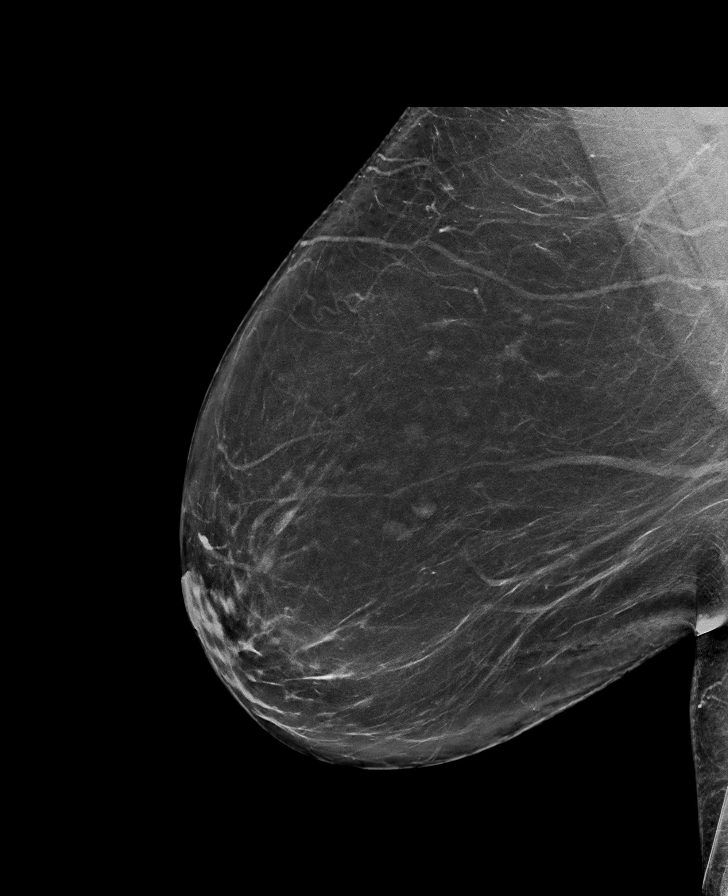

[R MLO tomo · tomo slice 45/88.0]
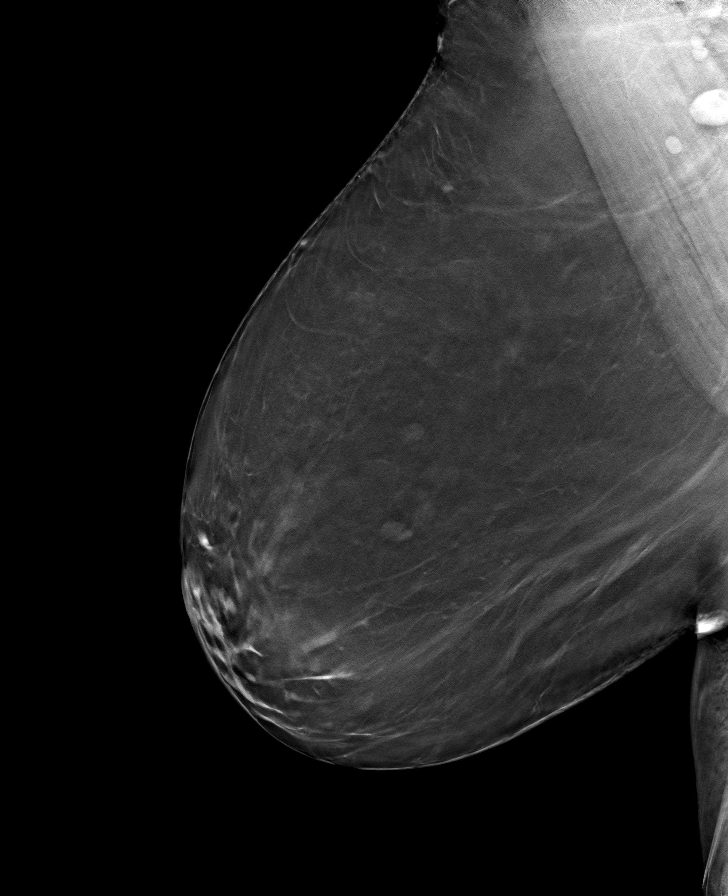

[L CC tomo · tomo slice 41/82.0]
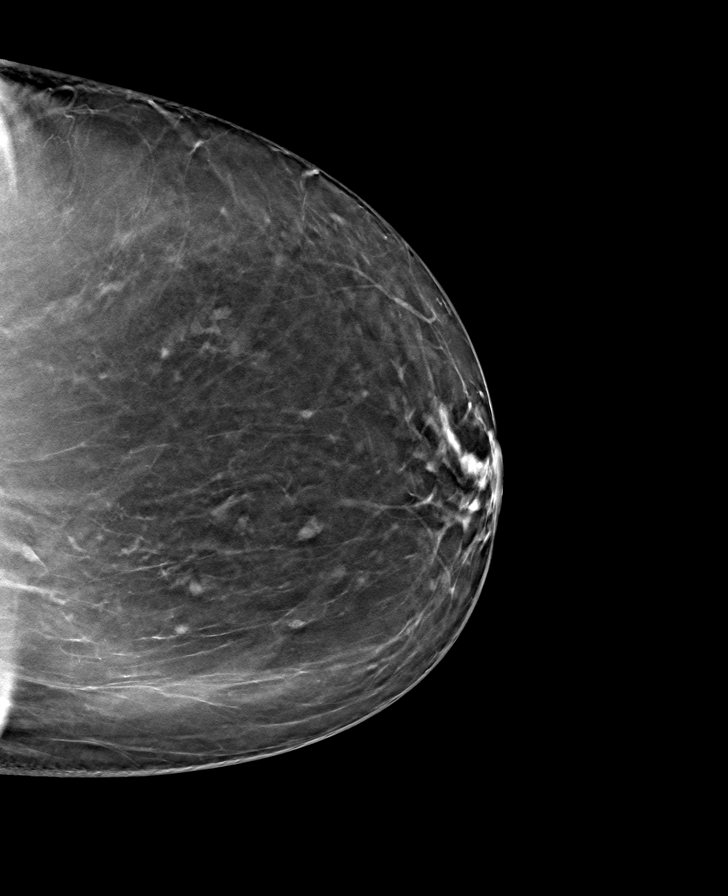

[R CC tomo · tomo slice 39/77.0]
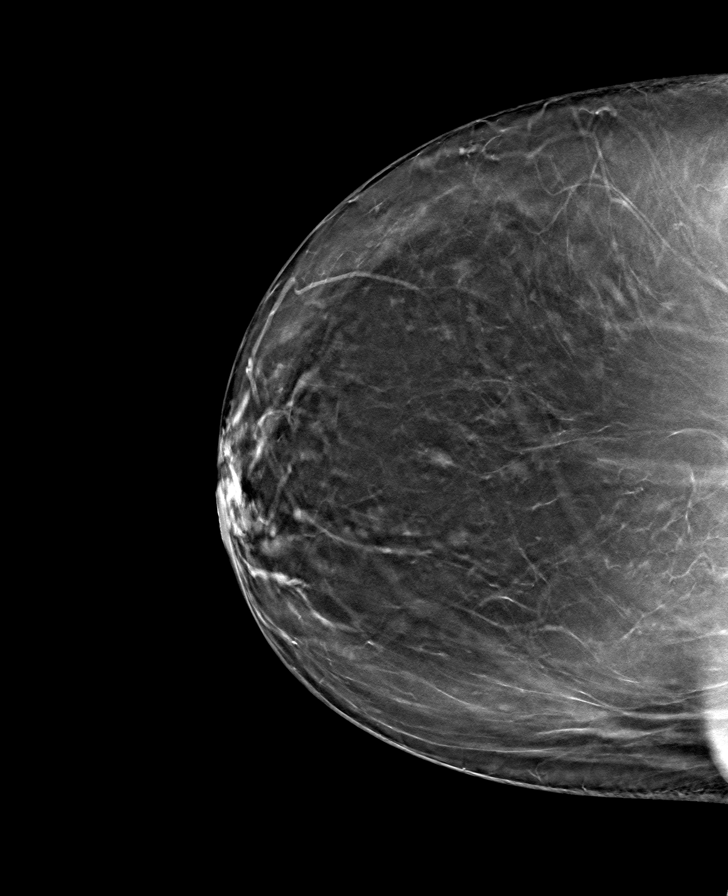

[L MLO tomo · tomo slice 45/90.0]
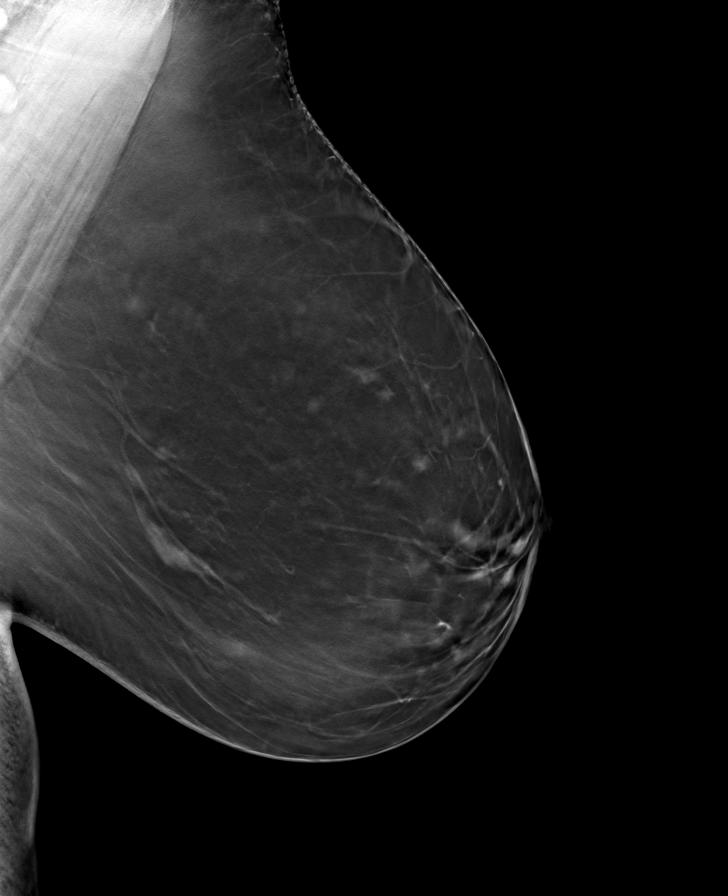

[8 of 24 positions shown; findings below may reference images not displayed]

ACR Breast Density Category b: There are scattered areas of
fibroglandular density.
FINDINGS: In the right breast, a possible mass warrants further evaluation. In
the left breast, no findings suspicious for malignancy.
IMPRESSION: Further evaluation is suggested for a possible mass in the right
breast.

RECOMMENDATION:
Diagnostic mammogram and possibly ultrasound of the right breast.
(Code:[id number])

The patient will be contacted regarding the findings, and additional
imaging will be scheduled.

BI-RADS CATEGORY  0: Incomplete. Need additional imaging evaluation
and/or prior mammograms for comparison.

## 2022-10-08 NOTE — Progress Notes (Unsigned)
The patient attended(date) screening event where her/his screening results were_____. At the event the patient noted (whater noted). Patient was given [facility] Resources). Per chart review (info found in chart pcp found, last ov with pcp). Chart review also indicates(future appt). No additional Health equity team support indicated at this time.

## 2022-10-17 ENCOUNTER — Other Ambulatory Visit: Payer: Self-pay | Admitting: Gastroenterology

## 2022-10-17 ENCOUNTER — Ambulatory Visit: Payer: Commercial Managed Care - HMO | Admitting: Internal Medicine

## 2022-10-17 ENCOUNTER — Telehealth: Payer: Self-pay

## 2022-10-17 ENCOUNTER — Telehealth: Payer: Self-pay | Admitting: *Deleted

## 2022-10-17 NOTE — Telephone Encounter (Signed)
Pt is scheduled for tele 12/01/22 at 2:20 pm. Med rec and consent done    Patient Consent for Virtual Visit        Meghan [name] has provided verbal consent on 10/17/2022 for a virtual visit (video or telephone).   CONSENT FOR VIRTUAL VISIT FOR:  Meghan [name]  By participating in this virtual visit I agree to the following:  I hereby voluntarily request, consent and authorize [facility] and its employed or contracted physicians, physician assistants, nurse practitioners or other licensed health care professionals (the Practitioner), to provide me with telemedicine health care services (the "Services") as deemed necessary by the treating Practitioner. I acknowledge and consent to receive the Services by the Practitioner via telemedicine. I understand that the telemedicine visit will involve communicating with the Practitioner through live audiovisual communication technology and the disclosure of certain medical information by electronic transmission. I acknowledge that I have been given the opportunity to request an in-person assessment or other available alternative prior to the telemedicine visit and am voluntarily participating in the telemedicine visit.  I understand that I have the right to withhold or withdraw my consent to the use of telemedicine in the course of my care at any time, without affecting my right to future care or treatment, and that the Practitioner or I may terminate the telemedicine visit at any time. I understand that I have the right to inspect all information obtained and/or recorded in the course of the telemedicine visit and may receive copies of available information for a reasonable fee.  I understand that some of the potential risks of receiving the Services via telemedicine include:  Delay or interruption in medical evaluation due to technological equipment failure or disruption; Information transmitted may not be sufficient (e.g. poor resolution of  images) to allow for appropriate medical decision making by the Practitioner; and/or  In rare instances, security protocols could fail, causing a breach of personal health information.  Furthermore, I acknowledge that it is my responsibility to provide information about my medical history, conditions and care that is complete and accurate to the best of my ability. I acknowledge that Practitioner's advice, recommendations, and/or decision may be based on factors not within their control, such as incomplete or inaccurate data provided by me or distortions of diagnostic images or specimens that may result from electronic transmissions. I understand that the practice of medicine is not an exact science and that Practitioner makes no warranties or guarantees regarding treatment outcomes. I acknowledge that a copy of this consent can be made available to me via my patient portal [facility] MyChart), or I can request a printed copy by calling the office of [facility].    I understand that my insurance will be billed for this visit.   I have read or had this consent read to me. I understand the contents of this consent, which adequately explains the benefits and risks of the Services being provided via telemedicine.  I have been provided ample opportunity to ask questions regarding this consent and the Services and have had my questions answered to my satisfaction. I give my informed consent for the services to be provided through the use of telemedicine in my medical care

## 2022-10-17 NOTE — Telephone Encounter (Signed)
   Name: Meghan [name]  DOB: 01/20/1961  MRN: [id number]  Primary Cardiologist: Orbie Pyo, MD   Preoperative team, please contact this patient and set up a phone call appointment for further preoperative risk assessment. Please obtain consent and complete medication review. Thank you for your help.  I confirm that guidance regarding antiplatelet and oral anticoagulation therapy has been completed and, if necessary, noted below.  Per office protocol, if patient is without any new symptoms or concerns at the time of their virtual visit, he/she may hold ASA for 7 days prior to procedure. Please resume ASA as soon as possible postprocedure, at the discretion of the surgeon.     Joni Reining, NP 10/17/2022, 12:26 PM [facility]

## 2022-10-17 NOTE — Telephone Encounter (Signed)
Pt is scheduled for tele 12/01/22 at 2:20 pm. Med rec and consent done

## 2022-10-17 NOTE — Telephone Encounter (Signed)
   Pre-operative Risk Assessment    Patient Name: Meghan [name]  DOB: May 20, 1960 MRN: [id number]      Request for Surgical Clearance    Procedure:   Colonoscopy  Date of Surgery:  Clearance 12/12/22                                 Surgeon:  Dr. Jeani Hawking Surgeon's Group or Practice Name:  [facility]  Phone number:  [phone] Fax number:  [phone]   Type of Clearance Requested:   - Medical  - Pharmacy:  Hold Aspirin Not Indicated.    Type of Anesthesia:   Proprofol   Additional requests/questions:    Signed, Emmit Pomfret   10/17/2022, 11:16 AM

## 2022-10-20 ENCOUNTER — Encounter (HOSPITAL_COMMUNITY): Payer: Self-pay | Admitting: Gastroenterology

## 2022-11-06 ENCOUNTER — Other Ambulatory Visit: Payer: Self-pay

## 2022-11-06 ENCOUNTER — Telehealth: Payer: Self-pay | Admitting: Family Medicine

## 2022-11-06 NOTE — Telephone Encounter (Signed)
After Hours Calls  Received after hours page. Called pt back, she is requesting refill of amlodipine. No emergency at this time. Advised pt to call clinic back during clinic hours to request refill.

## 2022-11-07 ENCOUNTER — Ambulatory Visit (INDEPENDENT_AMBULATORY_CARE_PROVIDER_SITE_OTHER): Payer: Managed Care, Other (non HMO)

## 2022-11-07 DIAGNOSIS — E538 Deficiency of other specified B group vitamins: Secondary | ICD-10-CM

## 2022-11-07 MED ORDER — CYANOCOBALAMIN 1000 MCG/ML IJ SOLN
1000.0000 ug | Freq: Once | INTRAMUSCULAR | Status: AC
Start: 2022-11-07 — End: 2022-11-07
  Administered 2022-11-07: 1000 ug via INTRAMUSCULAR

## 2022-11-07 MED ORDER — AMLODIPINE BESYLATE 5 MG PO TABS: 2.5000 mg | ORAL_TABLET | Freq: Every day | ORAL | 1 refills | Status: DC

## 2022-11-07 NOTE — Progress Notes (Signed)
Pt is here for a b12 injection today.    Date of last office visit that b12 was discussed 09/10/2022  Last injection was 08/26/2022  Injection given in Right deltoid, pt tolerated well  . Veronda Prude, RN

## 2022-11-20 ENCOUNTER — Telehealth: Payer: Self-pay

## 2022-11-20 NOTE — Telephone Encounter (Signed)
Patient calls nurse line in regards to Amlodipine and Atorvastatin.   She reports she stopped taking these medications ~1.5 weeks ago. She reports she has taken more holistic approach with beet juice and other "juices."   She reports she has a fitbit that she wears daily that tracks her blood pressures.   Monday: 128/83 and 113/78 Tuesday: 120/82 Wednesday: 112/74 and 112/78 Today: 124/82  She denies any episodes of headaches or vision changes during the last ~1.5 weeks.   She has an apt with PCP on 11/1.  Will forward to PCP for recommendations.

## 2022-11-21 NOTE — Telephone Encounter (Signed)
Spoke with patient. She agreed with plan

## 2022-11-27 ENCOUNTER — Other Ambulatory Visit: Payer: Self-pay | Admitting: Family Medicine

## 2022-11-27 DIAGNOSIS — Z1231 Encounter for screening mammogram for malignant neoplasm of breast: Secondary | ICD-10-CM

## 2022-12-01 ENCOUNTER — Ambulatory Visit: Payer: Managed Care, Other (non HMO) | Attending: Cardiology

## 2022-12-01 DIAGNOSIS — Z01810 Encounter for preprocedural cardiovascular examination: Secondary | ICD-10-CM

## 2022-12-01 NOTE — Progress Notes (Signed)
Virtual Visit via Telephone Note   Because of [name]'s co-morbid illnesses, she is at least at moderate risk for complications without adequate follow up.  This format is felt to be most appropriate for this patient at this time.  The patient did not have access to video technology/had technical difficulties with video requiring transitioning to audio format only (telephone).  All issues noted in this document were discussed and addressed.  No physical exam could be performed with this format.  Please refer to the patient's chart for her consent to telehealth for [facility].  Evaluation Performed:  Preoperative cardiovascular risk assessment _____________   Date:  12/01/2022   Patient ID:  Meghan [name], DOB 12-25-1960, MRN [id number] Patient Location:  Home Provider location:   Office  Primary Care Provider:  Caro Laroche, DO Primary Cardiologist:  Orbie Pyo, MD  Chief Complaint / Patient Profile   62 y.o. y/o female with a h/o CVA, PFO, hyperlipidemia who is pending EGD and presents today for telephonic preoperative cardiovascular risk assessment.  History of Present Illness    Meghan [name] is a 62 y.o. female who presents via audio/video conferencing for a telehealth visit today.  Pt was last seen in cardiology clinic on 09/02/2022 by Lucile Crater, PA-C.  At that time Meghan [name] preparing to have TEE.  Her TEE was positive for small incidental PFO.  PFO was felt to be an incidental finding.  Cardiology did not recommend closure of the small PFO.  The patient is now pending procedure as outlined above. Since her last visit, she she remained stable from a cardiac standpoint.  Today she denies chest pain, shortness of breath, lower extremity edema, fatigue, palpitations, melena, hematuria, hemoptysis, diaphoresis, weakness, presyncope, syncope, orthopnea, and PND.   Past Medical History    Past Medical History:  Diagnosis Date    Angio-edema    Asthma    Back pain 01/13/2012   Lichen planus    Malignant hyperthermia    1988, 1987   Unspecified sinusitis (chronic) 08/12/2012   Urticaria    Past Surgical History:  Procedure Laterality Date   ADENOIDECTOMY     COLONOSCOPY WITH PROPOFOL N/A 08/02/2021   Procedure: COLONOSCOPY WITH PROPOFOL;  Surgeon: Jeani Hawking, MD;  Location: [facility];  Service: Gastroenterology;  Laterality: N/A;   ESOPHAGOGASTRODUODENOSCOPY (EGD) WITH PROPOFOL N/A 02/25/2019   Procedure: ESOPHAGOGASTRODUODENOSCOPY (EGD) WITH PROPOFOL;  Surgeon: Jeani Hawking, MD;  Location: [facility];  Service: Endoscopy;  Laterality: N/A;   ESOPHAGOGASTRODUODENOSCOPY (EGD) WITH PROPOFOL N/A 08/02/2021   Procedure: ESOPHAGOGASTRODUODENOSCOPY (EGD) WITH PROPOFOL;  Surgeon: Jeani Hawking, MD;  Location: [facility];  Service: Gastroenterology;  Laterality: N/A;   HEMOSTASIS CLIP PLACEMENT  02/25/2019   Procedure: HEMOSTASIS CLIP PLACEMENT;  Surgeon: Jeani Hawking, MD;  Location: [facility];  Service: Endoscopy;;   HEMOSTASIS CLIP PLACEMENT  08/02/2021   Procedure: HEMOSTASIS CLIP PLACEMENT;  Surgeon: Jeani Hawking, MD;  Location: [facility];  Service: Gastroenterology;;   POLYPECTOMY  02/25/2019   Procedure: POLYPECTOMY;  Surgeon: Jeani Hawking, MD;  Location: [facility];  Service: Endoscopy;;   POLYPECTOMY  08/02/2021   Procedure: POLYPECTOMY;  Surgeon: Jeani Hawking, MD;  Location: [facility];  Service: Gastroenterology;;  EGD and COLON   TEE WITHOUT CARDIOVERSION N/A 09/10/2022   Procedure: TRANSESOPHAGEAL ECHOCARDIOGRAM;  Surgeon: Little Ishikawa, MD;  Location: [facility] INVASIVE CV LAB;  Service: Cardiovascular;  Laterality: N/A;   TONSILLECTOMY  1987   TUBAL LIGATION  1988  UTERINE ARTERY EMBOLIZATION  2000   for fibroids     Allergies  Allergies  Allergen Reactions   Epinephrine Other (See Comments)    Shuts body down   Shellfish Allergy Itching and Swelling   Codeine Hives and Itching    Latex Swelling and Hives    Per allergist   Lidocaine Swelling    Disorientation, shocks system  OK to use mepivacaine     Metronidazole Hives and Itching   Morphine     Shuts system down    Other     malignant hyperthermia   Oxycodone     Shut system down    Oxycodone-Acetaminophen     Shuts system down   Sulfonamide Derivatives Hives and Itching    Home Medications    Prior to Admission medications   Medication Sig Start Date End Date Taking? Authorizing Provider  albuterol (VENTOLIN HFA) 108 (90 Base) MCG/ACT inhaler INHALE 2 PUFFS INTO LUNGS EVERY 6 HOURS AS NEEDED FOR WHEEZING FOR SHORTNESS OF BREATH 01/27/22   Billey Co, MD  amLODipine (NORVASC) 5 MG tablet Take 0.5 tablets (2.5 mg total) by mouth at bedtime. 11/07/22   Caro Laroche, DO  aspirin EC 81 MG tablet Take 1 tablet (81 mg total) by mouth daily. Swallow whole. 04/18/22   Celine Mans, MD  atorvastatin (LIPITOR) 20 MG tablet Take 1 tablet (20 mg total) by mouth daily. 07/21/22 10/19/22  Carney Living, MD  Blood Pressure Monitoring (3 SERIES BP MONITOR/UPPER ARM) DEVI Use as directed 04/30/22   Carney Living, MD  cetirizine (ZYRTEC) 10 MG tablet Take 10 mg by mouth daily as needed for allergies.    [provider]  Cyanocobalamin (B-12 COMPLIANCE INJECTION) 1000 MCG/ML KIT Inject 1,000 mcg as directed every 30 (thirty) days.    [provider]  EPINEPHrine 0.3 mg/0.3 mL IJ SOAJ injection Inject 0.3 mg into the muscle as needed for anaphylaxis. 06/19/21   Espinoza, Myrlene Broker, DO  GINSENG KOREAN PO Take 1 tablet by mouth daily. Ginseng Panax    [provider]  MAGNESIUM PO Take 1 tablet by mouth 2 (two) times daily. Magnesium Taurate    [provider]  Misc Natural Products (GREEN TEA) TABS Take 1 tablet by mouth daily.    [provider]  Multiple Vitamins-Minerals (HAIR SKIN & NAILS) TABS Take 1 tablet by mouth daily.    [provider]     Physical Exam    Vital Signs:  Meghan [name] does not have vital signs available for review today.  Given telephonic nature of communication, physical exam is limited. AAOx3. NAD. Normal affect.  Speech and respirations are unlabored.  Accessory Clinical Findings    None  Assessment & Plan    1.  Preoperative Cardiovascular Risk Assessment: EGD, Dr. Jeani Hawking, [facility], fax #[phone]    Primary Cardiologist: Orbie Pyo, MD  Chart reviewed as part of pre-operative protocol coverage. Given past medical history and time since last visit, based on ACC/AHA guidelines, Meghan [name] would be at acceptable risk for the planned procedure without further cardiovascular testing.   Her aspirin may be held for 7 days prior to her procedure.  Please resume as soon as hemostasis is achieved.  Patient was advised that if he/she develops new symptoms prior to surgery to contact our office to arrange a follow-up appointment.  She verbalized understanding.  I will route this recommendation to the requesting party via Epic fax function  and remove from pre-op pool.       Time:   Today, I have spent 5 minutes with the patient with telehealth technology discussing medical history, symptoms, and management plan.  Prior to patient's phone evaluation I spent greater than 10 minutes reviewing their past medical history and cardiac medications.    Ronney Asters, NP  12/01/2022, 8:17 AM

## 2022-12-05 NOTE — Progress Notes (Addendum)
Anesthesia Review:  PCP: Rudene Anda  Cardiologist : Yvonne Kendall - clearance cardiolgoy 12/01/22- preop telephone visit  Chest x-ray : EKG : 09/02/22  Echo : 09/10/22  Home sleep test- 06/30/2022  Stress test: Cardiac Cath :  Activity level:  Sleep Study/ CPAP : Fasting Blood Sugar :      / Checks Blood Sugar -- times a day:   Blood Thinner/ Instructions /Last Dose: ASA / Instructions/ Last Dose :    MALIGNANT HYPERTHERMIA - 1987, 1988    Hx of CVA   LVMM on 12/05/22.   PT called back and LVMM  Called pt back and LVMM.

## 2022-12-11 ENCOUNTER — Encounter (HOSPITAL_COMMUNITY): Payer: Self-pay | Admitting: Gastroenterology

## 2022-12-11 NOTE — Anesthesia Preprocedure Evaluation (Addendum)
Anesthesia Evaluation  Patient identified by MRN, date of birth, ID band Patient awake    Reviewed: Allergy & Precautions, NPO status , Patient's Chart, lab work & pertinent test results  History of Anesthesia Complications (+) MALIGNANT HYPERTHERMIA and history of anesthetic complications  Airway Mallampati: II  TM Distance: >3 FB Neck ROM: Full    Dental no notable dental hx. (+) Dental Advisory Given, Teeth Intact   Pulmonary asthma , former smoker   Pulmonary exam normal breath sounds clear to auscultation       Cardiovascular hypertension, Pt. on medications Normal cardiovascular exam Rhythm:Regular Rate:Normal  Echo 09/10/2022  1. Left ventricular ejection fraction, by estimation, is 60 to 65%. The  left ventricle has normal function.   2. No left atrial/left atrial appendage thrombus was detected.   3. Right ventricular systolic function is normal. The right ventricular  size is normal.   4. The mitral valve is normal in structure. Trivial mitral valve  regurgitation.   5. The aortic valve is tricuspid. Aortic valve regurgitation is not  visualized. No aortic stenosis is present.   6. Agitated saline contrast bubble study was positive with shunting  observed within 3-6 cardiac cycles suggestive of interatrial shunt. A few  bubbles were seen in right atrium within 3-6 cardiac cycles, suggesting  small PFO.     Echo 04/18/22:  1. Left ventricular ejection fraction, by estimation, is 60 to 65%. The left ventricle has normal function. The left ventricle has no regional wall motion abnormalities. There is mild left ventricular hypertrophy. Left ventricular diastolic parameters were normal.   2. Right ventricular systolic function is normal. The right ventricular size is normal. There is normal pulmonary artery systolic pressure.   3. The mitral valve is normal in structure. No evidence of mitral valve regurgitation. No  evidence of mitral stenosis.   4. The aortic valve is normal in structure. Aortic valve regurgitation is not visualized. No aortic stenosis is present.   5. The inferior vena cava is normal in size with greater than 50% respiratory variability, suggesting right atrial pressure of 3 mmHg.      Neuro/Psych  Headaches CVA    GI/Hepatic Neg liver ROS,GERD  ,,  Endo/Other  negative endocrine ROS    Renal/GU negative Renal ROS     Musculoskeletal  (+) Arthritis ,    Abdominal   Peds  Hematology  (+) Blood dyscrasia, anemia   Anesthesia Other Findings   Reproductive/Obstetrics                             Anesthesia Physical Anesthesia Plan  ASA: 3  Anesthesia Plan: MAC   Post-op Pain Management: Minimal or no pain anticipated   Induction: Intravenous  PONV Risk Score and Plan: 3 and TIVA, Propofol infusion and Treatment may vary due to age or medical condition  Airway Management Planned: Natural Airway and Simple Face Mask  Additional Equipment:   Intra-op Plan:   Post-operative Plan:   Informed Consent: I have reviewed the patients History and Physical, chart, labs and discussed the procedure including the risks, benefits and alternatives for the proposed anesthesia with the patient or authorized representative who has indicated his/her understanding and acceptance.     Dental advisory given  Plan Discussed with: CRNA  Anesthesia Plan Comments:         Anesthesia Quick Evaluation

## 2022-12-12 ENCOUNTER — Ambulatory Visit (HOSPITAL_COMMUNITY): Payer: Commercial Managed Care - HMO | Admitting: Medical

## 2022-12-12 ENCOUNTER — Ambulatory Visit (HOSPITAL_BASED_OUTPATIENT_CLINIC_OR_DEPARTMENT_OTHER): Payer: Commercial Managed Care - HMO | Admitting: Medical

## 2022-12-12 ENCOUNTER — Encounter (HOSPITAL_COMMUNITY)
Admission: RE | Disposition: A | Discharge status: Home / Self Care | Payer: Self-pay | Source: Home / Self Care | Attending: Gastroenterology

## 2022-12-12 ENCOUNTER — Encounter (HOSPITAL_COMMUNITY): Payer: Self-pay | Admitting: Gastroenterology

## 2022-12-12 ENCOUNTER — Other Ambulatory Visit: Payer: Self-pay

## 2022-12-12 ENCOUNTER — Ambulatory Visit (HOSPITAL_COMMUNITY)
Admission: RE | Admit: 2022-12-12 | Discharge: 2022-12-12 | Disposition: A | Discharge status: Home / Self Care | Payer: Commercial Managed Care - HMO | Attending: Gastroenterology | Admitting: Gastroenterology

## 2022-12-12 ENCOUNTER — Ambulatory Visit: Payer: Managed Care, Other (non HMO) | Admitting: Family Medicine

## 2022-12-12 DIAGNOSIS — K317 Polyp of stomach and duodenum: Secondary | ICD-10-CM

## 2022-12-12 DIAGNOSIS — Z8673 Personal history of transient ischemic attack (TIA), and cerebral infarction without residual deficits: Secondary | ICD-10-CM | POA: Diagnosis not present

## 2022-12-12 DIAGNOSIS — K449 Diaphragmatic hernia without obstruction or gangrene: Secondary | ICD-10-CM

## 2022-12-12 DIAGNOSIS — Z09 Encounter for follow-up examination after completed treatment for conditions other than malignant neoplasm: Secondary | ICD-10-CM | POA: Diagnosis present

## 2022-12-12 DIAGNOSIS — I10 Essential (primary) hypertension: Secondary | ICD-10-CM | POA: Insufficient documentation

## 2022-12-12 DIAGNOSIS — Z87891 Personal history of nicotine dependence: Secondary | ICD-10-CM | POA: Diagnosis not present

## 2022-12-12 DIAGNOSIS — K219 Gastro-esophageal reflux disease without esophagitis: Secondary | ICD-10-CM | POA: Insufficient documentation

## 2022-12-12 HISTORY — PX: POLYPECTOMY: SHX5525

## 2022-12-12 HISTORY — PX: ESOPHAGOGASTRODUODENOSCOPY (EGD) WITH PROPOFOL: SHX5813

## 2022-12-12 SURGERY — ESOPHAGOGASTRODUODENOSCOPY (EGD) WITH PROPOFOL
Anesthesia: Monitor Anesthesia Care

## 2022-12-12 MED ORDER — PROPOFOL 10 MG/ML IV BOLUS
INTRAVENOUS | Status: DC | PRN
  Administered 2022-12-12: 20 mg via INTRAVENOUS
  Administered 2022-12-12: 40 mg via INTRAVENOUS

## 2022-12-12 MED ORDER — PROPOFOL 500 MG/50ML IV EMUL
INTRAVENOUS | Status: AC
Start: 2022-12-12 — End: ?
  Filled 2022-12-12: qty 50

## 2022-12-12 MED ORDER — PROPOFOL 500 MG/50ML IV EMUL
INTRAVENOUS | Status: DC | PRN
  Administered 2022-12-12: 125 ug/kg/min via INTRAVENOUS

## 2022-12-12 MED ORDER — SODIUM CHLORIDE 0.9 % IV SOLN
INTRAVENOUS | Status: DC
Start: 2022-12-12 — End: 2022-12-12

## 2022-12-12 SURGICAL SUPPLY — 15 items

## 2022-12-12 NOTE — Anesthesia Postprocedure Evaluation (Signed)
Anesthesia Post Note  Patient: Meghan [name]  Procedure(s) Performed: ESOPHAGOGASTRODUODENOSCOPY (EGD) WITH PROPOFOL POLYPECTOMY     Patient location during evaluation: PACU Anesthesia Type: MAC Level of consciousness: awake and alert Pain management: pain level controlled Vital Signs Assessment: post-procedure vital signs reviewed and stable Respiratory status: spontaneous breathing Cardiovascular status: stable Anesthetic complications: no   No notable events documented.  Last Vitals:  Vitals:   12/12/22 0920 12/12/22 0925  BP: 137/75   Pulse: (!) 51 (!) 51  Resp: 18 14  Temp: 36.6 C   SpO2: 96% 100%    Last Pain:  Vitals:   12/12/22 0920  TempSrc: Oral  PainSc: 0-No pain                 Lewie Loron

## 2022-12-12 NOTE — Anesthesia Procedure Notes (Signed)
Procedure Name: MAC Date/Time: 12/12/2022 8:45 AM  Performed by: Vanessa Wells, CRNAPre-anesthesia Checklist: Patient identified, Emergency Drugs available, Suction available and Patient being monitored Patient Re-evaluated:Patient Re-evaluated prior to induction Oxygen Delivery Method: Simple face mask

## 2022-12-12 NOTE — H&P (Signed)
Meghan [name] HPI: The patient is here for a surveillance endoscopy.  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  Past Medical History:  Diagnosis Date   Angio-edema    Asthma    Back pain 01/13/2012   Lichen planus    Malignant hyperthermia    1988, 1987   Unspecified sinusitis (chronic) 08/12/2012   Urticaria     Past Surgical History:  Procedure Laterality Date   ADENOIDECTOMY     COLONOSCOPY WITH PROPOFOL N/A 08/02/2021   Procedure: COLONOSCOPY WITH PROPOFOL;  Surgeon: Jeani Hawking, MD;  Location: [facility];  Service: Gastroenterology;  Laterality: N/A;   ESOPHAGOGASTRODUODENOSCOPY (EGD) WITH PROPOFOL N/A 02/25/2019   Procedure: ESOPHAGOGASTRODUODENOSCOPY (EGD) WITH PROPOFOL;  Surgeon: Jeani Hawking, MD;  Location: [facility];  Service: Endoscopy;  Laterality: N/A;   ESOPHAGOGASTRODUODENOSCOPY (EGD) WITH PROPOFOL N/A 08/02/2021   Procedure: ESOPHAGOGASTRODUODENOSCOPY (EGD) WITH PROPOFOL;  Surgeon: Jeani Hawking, MD;  Location: [facility];  Service: Gastroenterology;  Laterality: N/A;   HEMOSTASIS CLIP PLACEMENT  02/25/2019   Procedure: HEMOSTASIS CLIP PLACEMENT;  Surgeon: Jeani Hawking, MD;  Location: [facility];  Service: Endoscopy;;   HEMOSTASIS CLIP PLACEMENT  08/02/2021   Procedure: HEMOSTASIS CLIP PLACEMENT;  Surgeon: Jeani Hawking, MD;  Location: [facility];  Service: Gastroenterology;;   POLYPECTOMY  02/25/2019   Procedure: POLYPECTOMY;  Surgeon: Jeani Hawking, MD;  Location: [facility];  Service: Endoscopy;;   POLYPECTOMY  08/02/2021   Procedure: POLYPECTOMY;  Surgeon: Jeani Hawking, MD;  Location: [facility];  Service: Gastroenterology;;  EGD and COLON   TEE WITHOUT CARDIOVERSION N/A 09/10/2022   Procedure: TRANSESOPHAGEAL ECHOCARDIOGRAM;  Surgeon: Little Ishikawa, MD;  Location: [facility] INVASIVE CV LAB;  Service: Cardiovascular;  Laterality: N/A;   TONSILLECTOMY  1987   TUBAL LIGATION  1988   UTERINE ARTERY  EMBOLIZATION  2000   for fibroids     Family History  Problem Relation Age of Onset   Hypertension Mother    Kidney disease Mother    Cholecystitis Mother    Hypertension Father    Diabetes Father    Kidney disease Daughter     Social History:  reports that she quit smoking about 27 years ago. Her smoking use included cigarettes. She has never used smokeless tobacco. She reports current alcohol use of about 0.5 standard drinks of alcohol per week. She reports that she does not use drugs.  Allergies:  Allergies  Allergen Reactions   Epinephrine Other (See Comments)    Shuts body down   Shellfish Allergy Itching and Swelling   Codeine Hives and Itching   Latex Swelling and Hives    Per allergist   Lidocaine Swelling    Disorientation, shocks system  OK to use mepivacaine     Metronidazole Hives and Itching   Morphine     Shuts system down    Other     malignant hyperthermia   Oxycodone     Shut system down    Oxycodone-Acetaminophen     Shuts system down   Sulfonamide Derivatives Hives and Itching    Medications: Scheduled: Continuous:  sodium chloride      No results found for this or any previous visit (from the past 24 hour(s)).   No results found.  ROS:  As stated above in the HPI otherwise negative.  Blood pressure (!) 188/83, pulse (!) 58, temperature (!) 97 F (36.1 C), temperature source Temporal, resp. rate 15, height 5' 6.5" (1.689 m), weight  81.6 kg, SpO2 98%.    PE: Gen: NAD, Alert and Oriented HEENT:  /AT, EOMI Neck: Supple, no LAD Lungs: CTA Bilaterally CV: RRR without M/G/R ABD: Soft, NTND, +BS Ext: No C/C/E  Assessment/Plan: 1) Gastric hyperplastic polyps - EGD for surveillance.  Meghan [name] D 12/12/2022, 8:32 AM

## 2022-12-12 NOTE — Transfer of Care (Signed)
Immediate Anesthesia Transfer of Care Note  Patient: DANELLA PHILSON  Procedure(s) Performed: ESOPHAGOGASTRODUODENOSCOPY (EGD) WITH PROPOFOL POLYPECTOMY  Patient Location: Endoscopy Unit  Anesthesia Type:MAC  Level of Consciousness: awake and patient cooperative  Airway & Oxygen Therapy: Patient Spontanous Breathing and Patient connected to face mask  Post-op Assessment: Report given to RN and Post -op Vital signs reviewed and stable  Post vital signs: Reviewed and stable  Last Vitals:  Vitals Value Taken Time  BP    Temp    Pulse 74 12/12/22 0901  Resp 30 12/12/22 0901  SpO2 96 % 12/12/22 0901  Vitals shown include unfiled device data.  Last Pain:  Vitals:   12/12/22 0800  TempSrc: Temporal  PainSc: 0-No pain         Complications: No notable events documented.

## 2022-12-12 NOTE — Op Note (Signed)
[facility] Patient Name: Meghan [name] Procedure Date: 12/12/2022 MRN: [id number] Attending MD: Jeani Hawking , MD, [id number] Date of Birth: Nov 24, 1960 CSN: [id number] Age: 62 Admit Type: Ambulatory Procedure:                Upper GI endoscopy Indications:              Surveillance procedure Providers:                Jeani Hawking, MD, Suzy Bouchard, RN, Doristine Mango,                            RN, Marja Kays, Technician, Geoffery Lyons,                            Technician Referring MD:              Medicines:                Propofol per Anesthesia Complications:            No immediate complications. Estimated Blood Loss:     Estimated blood loss: none. Procedure:                Pre-Anesthesia Assessment:                           - Prior to the procedure, a History and Physical                            was performed, and patient medications and                            allergies were reviewed. The patient's tolerance of                            previous anesthesia was also reviewed. The risks                            and benefits of the procedure and the sedation                            options and risks were discussed with the patient.                            All questions were answered, and informed consent                            was obtained. Prior Anticoagulants: The patient has                            taken no anticoagulant or antiplatelet agents. ASA                            Grade Assessment: III - A patient with severe                            systemic  disease. After reviewing the risks and                            benefits, the patient was deemed in satisfactory                            condition to undergo the procedure.                           - Sedation was administered by an anesthesia                            professional. Deep sedation was attained.                           After obtaining informed consent,  the endoscope was                            passed under direct vision. Throughout the                            procedure, the patient's blood pressure, pulse, and                            oxygen saturations were monitored continuously. The                            GIF-H190 ([id number]) Olympus endoscope was introduced                            through the mouth, and advanced to the second part                            of duodenum. The upper GI endoscopy was                            accomplished without difficulty. The patient                            tolerated the procedure well. Scope In: Scope Out: Findings:      A 3 cm hiatal hernia was present.      Two 5 mm sessile polyps with no bleeding and no stigmata of recent       bleeding were found in the cardia. The polyp was removed with a cold       snare. Resection and retrieval were complete.      The examined duodenum was normal. Impression:               - 3 cm hiatal hernia.                           - Two gastric polyps. Resected and retrieved.                           - Normal examined duodenum. Moderate Sedation:  Not Applicable - Patient had care per Anesthesia. Recommendation:           - Patient has a contact number available for                            emergencies. The signs and symptoms of potential                            delayed complications were discussed with the                            patient. Return to normal activities tomorrow.                            Written discharge instructions were provided to the                            patient.                           - Resume previous diet.                           - Continue present medications.                           - Await pathology results.                           - Repeat upper endoscopy in 2 years for                            surveillance. Procedure Code(s):        --- Professional ---                           [phone],  Esophagogastroduodenoscopy, flexible,                            transoral; with removal of tumor(s), polyp(s), or                            other lesion(s) by snare technique Diagnosis Code(s):        --- Professional ---                           K31.7, Polyp of stomach and duodenum                           K44.9, Diaphragmatic hernia without obstruction or                            gangrene CPT copyright 2022 [employer]. All rights reserved. The codes documented in this report are preliminary and upon coder review may  be revised to meet current compliance requirements. Jeani Hawking, MD Jeani Hawking, MD 12/12/2022 9:06:21 AM This report has been signed electronically. Number of  Addenda: 0

## 2022-12-12 NOTE — Discharge Instructions (Signed)

## 2022-12-12 NOTE — Progress Notes (Deleted)
   SUBJECTIVE:   CHIEF COMPLAINT / HPI:   *** - getting EGD today.  OBJECTIVE:   There were no vitals taken for this visit.  ***  ASSESSMENT/PLAN:   No problem-specific Assessment & Plan notes found for this encounter.     Caro Laroche, DO

## 2022-12-15 LAB — SURGICAL PATHOLOGY

## 2022-12-17 ENCOUNTER — Encounter (HOSPITAL_COMMUNITY): Payer: Self-pay | Admitting: Gastroenterology

## 2023-01-02 ENCOUNTER — Ambulatory Visit
Admission: RE | Admit: 2023-01-02 | Discharge: 2023-01-02 | Disposition: A | Discharge status: Home / Self Care | Payer: Managed Care, Other (non HMO) | Source: Ambulatory Visit | Attending: Family Medicine | Admitting: Family Medicine

## 2023-01-02 DIAGNOSIS — Z1231 Encounter for screening mammogram for malignant neoplasm of breast: Secondary | ICD-10-CM

## 2023-01-07 ENCOUNTER — Ambulatory Visit: Payer: Managed Care, Other (non HMO)

## 2023-01-16 ENCOUNTER — Ambulatory Visit (INDEPENDENT_AMBULATORY_CARE_PROVIDER_SITE_OTHER): Payer: Commercial Managed Care - HMO

## 2023-01-16 DIAGNOSIS — E538 Deficiency of other specified B group vitamins: Secondary | ICD-10-CM

## 2023-01-16 MED ORDER — CYANOCOBALAMIN 1000 MCG/ML IJ SOLN
1000.0000 ug | Freq: Once | INTRAMUSCULAR | Status: AC
  Administered 2023-01-16: 1000 ug via INTRAMUSCULAR

## 2023-01-16 NOTE — Progress Notes (Signed)
Pt is here for a b12 injection today.    Date of last office visit that b12 was discussed 09/10/2022  Last injection was 11/07/22. Discussed with Dr. Linwood Dibbles due to being two months past due. Received verbal orders to proceed with B12 injection  Injection given in left deltoid, pt tolerated well.   Veronda Prude, RN

## 2023-02-25 ENCOUNTER — Ambulatory Visit: Payer: Commercial Managed Care - HMO | Admitting: Family Medicine

## 2023-03-02 ENCOUNTER — Ambulatory Visit: Payer: Commercial Managed Care - HMO | Admitting: Family Medicine

## 2023-03-02 NOTE — Progress Notes (Deleted)
   SUBJECTIVE:   CHIEF COMPLAINT / HPI:   Hypertension: - Medications: none - Compliance: *** - Checking BP at home: *** - Denies any SOB, CP, vision changes, LE edema, medication SEs, or symptoms of hypotension - Diet: *** - Exercise: ***  Asthma - albuterol prn.   Cervical cancer screening - Last pap 2021, ASCUS with +HPV.  OBJECTIVE:   There were no vitals taken for this visit.  ***  ASSESSMENT/PLAN:   No problem-specific Assessment & Plan notes found for this encounter.     Caro Laroche, DO

## 2023-04-10 ENCOUNTER — Telehealth: Payer: Self-pay

## 2023-04-10 NOTE — Telephone Encounter (Signed)
 Patient Meghan [name] on nurse line requesting to speak with PCP.   I called patient back to discuss further.   She reports for the last week or more she has noticed a "sharp" stabbing pain to her temple. She reports sometimes its on the left side and sometimes it is on the right. She reports the episodes are very brief and last no longer than seconds.   She reports hx of CVA, however states at that time she had facial changes, drooling and one sided weakness and no headaches. She denies any of above at this time.   She denies any vision changes, shortness of breath or chest pains.   Patient advised she would need to be evaluated. Apt offered for Monday with strict precautions over the weekend. 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   Patient scheduled for 3/10 at 10:50am. 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   Will forward to PCP.

## 2023-04-20 ENCOUNTER — Ambulatory Visit: Payer: Medicaid Other | Admitting: Family Medicine

## 2023-04-24 ENCOUNTER — Encounter: Payer: Self-pay | Admitting: Family Medicine

## 2023-04-24 ENCOUNTER — Ambulatory Visit (INDEPENDENT_AMBULATORY_CARE_PROVIDER_SITE_OTHER): Admitting: Family Medicine

## 2023-04-24 VITALS — BP 172/90 | HR 69 | Ht 66.0 in | Wt 194.6 lb

## 2023-04-24 DIAGNOSIS — I10 Essential (primary) hypertension: Secondary | ICD-10-CM | POA: Diagnosis not present

## 2023-04-24 DIAGNOSIS — G4489 Other headache syndrome: Secondary | ICD-10-CM | POA: Diagnosis not present

## 2023-04-24 DIAGNOSIS — E538 Deficiency of other specified B group vitamins: Secondary | ICD-10-CM | POA: Diagnosis not present

## 2023-04-24 MED ORDER — AMLODIPINE BESYLATE 5 MG PO TABS: 5.0000 mg | ORAL_TABLET | Freq: Every day | ORAL | 0 refills | Status: DC

## 2023-04-24 MED ORDER — TIZANIDINE HCL 4 MG PO TABS: 4.0000 mg | ORAL_TABLET | Freq: Three times a day (TID) | ORAL | 0 refills | Status: AC | PRN

## 2023-04-24 NOTE — Patient Instructions (Addendum)
 It was great to see you!  Our plans for today:  - Start your amlodipine again.  - You can take muscle relaxer as needed and ibuprofen 600mg  as needed for headache.  - Monitor your blood pressure at home with an upper arm cuff and keep a log of your readings. Make sure to be seated for at least 5 minutes prior to testing and not in pain or worked up for the most accurate readings. Bring this log with you to follow up.  - Come back next week.   We are checking some labs today, we will release these results to your MyChart.  Take care and seek immediate care sooner if you develop any concerns.   Dr. Linwood Dibbles

## 2023-04-24 NOTE — Assessment & Plan Note (Signed)
 With h/o migraine, tension, prior stroke. Symptoms consistent with tension given trap hypertonicity on exam, triggered by stress and lack of nausea. Neuro exam normal. BP significantly elevated today and on recheck, likely contributing. Will restart amlodipine. Obtain labs. F/u next week for recheck, continue to check BP at home. Emergency precautions discussed.

## 2023-04-24 NOTE — Progress Notes (Signed)
   SUBJECTIVE:   CHIEF COMPLAINT / HPI:   HEADACHE - has h/o migraine and tension headahces. Previously had on daily basis. Stopped having around 2015 after moving to [place]. Moved back in 05/09/15 with slow return in headaches. Typically gets 1-2 per month. Often triggered by stress. 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 Runs a daycare. - current headache started this morning. Described as shooting, sharp. Located on L side of head.  - h/o stroke with no neuro deficits. Still on ASA. Previously on amlodipine for HTN but not taken in some time, came off due to lower BP. - pain does not interfere with daily activity.  - has not tried medicaitons for pain.  - denies head trauma  Symptoms Nausea vomiting: no Photophobia: a little  Noise sensitivity: a little Double vision or loss of vision: no Fever: no Neck Stiffness: no Trouble walking or speaking: no   OBJECTIVE:   BP (!) 172/90   Pulse 69   Ht 5\' 6"  (1.676 m)   Wt 194 lb 9.6 oz (88.3 kg)   SpO2 97%   BMI 31.41 kg/m   Gen: well appearing, in NAD Card: RRR Lungs: CTAB MSK: Tone and bulk normal. Significant trap hypertonicity with TTP bilaterally. Full ROM, strength 5/5 to U/LE bilaterally, normal gait.  No edema.  Neuro: Alert and oriented, speech normal. Optic field normal. PERRL, Extraocular movements intact.  Intact symmetric sensation to light touch of face and extremities bilaterally.  Hearing grossly intact bilaterally.  Tongue protrudes normally with no deviation.  Shoulder shrug, smile symmetric. Finger to nose normal.   ASSESSMENT/PLAN:   Headache With h/o migraine, tension, prior stroke. Symptoms consistent with tension given trap hypertonicity on exam, triggered by stress and lack of nausea. Neuro exam normal. BP significantly elevated today and on recheck, likely contributing. Will restart amlodipine. Obtain labs. F/u next week for recheck, continue to check BP at home.  Emergency precautions discussed.     Caro Laroche, DO

## 2023-04-28 ENCOUNTER — Encounter: Payer: Self-pay | Admitting: Family Medicine

## 2023-04-29 ENCOUNTER — Encounter: Payer: Self-pay | Admitting: Family Medicine

## 2023-04-29 LAB — BASIC METABOLIC PANEL
BUN/Creatinine Ratio: 11 — ABNORMAL LOW (ref 12–28)
BUN: 8 mg/dL (ref 8–27)
CO2: 24 mmol/L (ref 20–29)
Calcium: 9.9 mg/dL (ref 8.7–10.3)
Chloride: 105 mmol/L (ref 96–106)
Creatinine, Ser: 0.75 mg/dL (ref 0.57–1.00)
Glucose: 72 mg/dL (ref 70–99)
Potassium: 4.7 mmol/L (ref 3.5–5.2)
Sodium: 143 mmol/L (ref 134–144)
eGFR: 89 mL/min/{1.73_m2} (ref >59)

## 2023-04-29 LAB — VITAMIN B12: Vitamin B-12: 316 pg/mL (ref 232–1245)

## 2023-05-26 ENCOUNTER — Other Ambulatory Visit: Payer: Self-pay

## 2023-05-26 MED ORDER — AMLODIPINE BESYLATE 5 MG PO TABS: 5.0000 mg | ORAL_TABLET | Freq: Every day | ORAL | 0 refills | Status: DC

## 2023-06-19 ENCOUNTER — Other Ambulatory Visit: Payer: Self-pay | Admitting: Family Medicine

## 2023-06-22 ENCOUNTER — Ambulatory Visit

## 2023-06-23 ENCOUNTER — Ambulatory Visit (INDEPENDENT_AMBULATORY_CARE_PROVIDER_SITE_OTHER): Admitting: Student

## 2023-06-23 ENCOUNTER — Encounter: Payer: Self-pay | Admitting: Student

## 2023-06-23 VITALS — BP 142/82 | HR 70 | Ht 66.0 in | Wt 196.0 lb

## 2023-06-23 DIAGNOSIS — I10 Essential (primary) hypertension: Secondary | ICD-10-CM

## 2023-06-23 NOTE — Assessment & Plan Note (Signed)
 By report, home BPs have been at goal. Elevated x2 here today, but barely.  - Continue Amlodipine  5mg  daily - Keep a written home log and bring back for 1 month follow-up

## 2023-06-23 NOTE — Progress Notes (Signed)
    SUBJECTIVE:   CHIEF COMPLAINT / HPI:   Hypertension Here for BP f/u at the request of Dr. Alverna John. Was seen 2 months ago for headache and found to have markedly elevated BP x2. She was started on amlodipine  5mg  daily and advised to monitor BP at home. She has been doing so, but does not have a written log at present. She tells me her BPs at home have been running 135/80-82.  Last dose of amlodipine  was last night.   Continuity of Care  Patient expresses frustration at the difficulty she is having maintaining continuity of care with faculty PCP Dr. Rumball. She tells me that this is related to her employment: she is often only available for afternoon appointments and Dr. Rumball (along with the majority of our faculty) only has morning appointments. She would be open to seeing a resident physician, though she values the 35 years of continuity that she had with Dr. Bronson Canny. Advised that I would pass her concern along to practice manager/medical director.   OBJECTIVE:   BP (!) 142/82   Pulse 70   Ht 5\' 6"  (1.676 m)   Wt 196 lb (88.9 kg)   SpO2 99%   BMI 31.64 kg/m   General: alert & oriented, no apparent distress, well groomed HEENT: normocephalic, atraumatic, EOM grossly intact, oral mucosa moist, neck supple Respiratory: normal respiratory effort GI: non-distended Skin: no rashes, no jaundice Psych: appropriate mood and affect   ASSESSMENT/PLAN:   Assessment & Plan Essential hypertension By report, home BPs have been at goal. Elevated x2 here today, but barely.  - Continue Amlodipine  5mg  daily - Keep a written home log and bring back for 1 month follow-up    Continuity of Care Will discuss case with practice manager and medical director. Hopefully we can work together with the patient to find a suitable solution.   Alexa Andrews, MD [facility]

## 2023-06-23 NOTE — Patient Instructions (Addendum)
 Keep a log of all of your blood pressures at home and see me on 6/17. If they are running consistently >140/90, shoot me a MyChart message.   I will reach out to our medical director and office manager about getting you someone with afternoon appointments. They may call you once we have a solution.   Alexa Andrews, MD

## 2023-06-24 ENCOUNTER — Telehealth: Payer: Self-pay | Admitting: Family Medicine

## 2023-06-24 NOTE — Telephone Encounter (Signed)
-----   Message from Clyda Dark sent at 06/24/2023 11:43 AM EDT ----- Hi,  Yes that is fine with me.  Thank you, Adriana ----- Message ----- From: Arn Lane, MD Sent: 06/24/2023   9:45 AM EDT To: Martina Sledge Fleeger, CMA; Clyda Dark, DO  Hello Dr. Randeen Busman,  This is an atypical request approved by Dr. Alverna John (PCP). Patient wishes to switch to a resident so she can have PM appointments. Since you are on Green team with Rumball, for continuity of care, you are highly recommended as a potential match? Would you be fine with this switch? I will reach out to the patient once you agree.  Dr. Grandville Lax ----- Message ----- From: Laurine Pore, CMA Sent: 06/24/2023   9:39 AM EDT To: Arn Lane, MD; Kandis Ormond, DO; #  Fine by me, as long as we make it clear that continuity is less likely with a resident ----- Message ----- From: Arn Lane, MD Sent: 06/24/2023   9:12 AM EDT To: Martina Sledge Fleeger, CMA; Kandis Ormond, DO; #  We typically don't switch from faculty to resident. However, she need to know she'd not be able to switch back to a faculty once switched to a resident. Jess, what do you say? ----- Message ----- From: Kandis Ormond, DO Sent: 06/24/2023   9:04 AM EDT To: Martina Sledge Fleeger, CMA; Arn Lane, MD; #  I personally do not mind. I know she does value continuity even in our conversations so unsure if she would be ok with a resident switch. ----- Message ----- From: Arn Lane, MD Sent: 06/23/2023   3:07 PM EDT To: Martina Sledge Fleeger, CMA; Kandis Ormond, DO; #  If she sees a resident, then she'd need new PCP every 3 years. If she or Dr. Alverna John is fine with a switch, we can proceed. ----- Message ----- From: Limmie Ren, MD Sent: 06/23/2023   1:09 PM EDT To: Martina Sledge Fleeger, CMA; Arn Lane, MD; #  Hi all,  I met this lovely lady today. She asked me about a possible PCP switch as she has been finding it difficult  to see Dr. Alverna John regularly as she would like. She tells me that this is due to a work schedule which really limits her to afternoon appointments. She was a Control and instrumentation engineer patient for 35 years and valued that continuity.   I don't have a great solution her, other than that I promised her I would take her concern to you guys. None of our current faculty have regular afternoon visits. She would be willing to see a resident (asked me if I would take her on, which I declined given the timeline) but I'm not sure if she fully understands this would only be a 2-3 year relationship.   Thanks, Toy Freund

## 2023-06-24 NOTE — Telephone Encounter (Signed)
 I spoke with the patient regarding her request. Dr. Rumball approved the transition to a resident provider, and Dr. Randeen Busman agreed to assume her care. As none of our faculty are currently accepting new patients--and most see patients only in the mornings--the resident panel was the most viable option. I explained the transition process, including the need to assign a new PCP approximately every three years as residents graduate. The patient ultimately chose to remain with Dr. Rumball for now. She had the opportunity to ask questions, which I answered to the best of my ability. She expressed appreciation for the call.

## 2023-07-20 ENCOUNTER — Other Ambulatory Visit: Payer: Self-pay | Admitting: Family Medicine

## 2023-07-28 ENCOUNTER — Ambulatory Visit: Payer: Self-pay

## 2023-08-03 ENCOUNTER — Encounter: Payer: Self-pay | Admitting: Family Medicine

## 2023-08-03 ENCOUNTER — Telehealth: Payer: Self-pay

## 2023-08-03 ENCOUNTER — Ambulatory Visit (INDEPENDENT_AMBULATORY_CARE_PROVIDER_SITE_OTHER): Admitting: Family Medicine

## 2023-08-03 VITALS — BP 128/78 | HR 68 | Ht 66.0 in | Wt 192.2 lb

## 2023-08-03 DIAGNOSIS — R102 Pelvic and perineal pain: Secondary | ICD-10-CM

## 2023-08-03 DIAGNOSIS — E538 Deficiency of other specified B group vitamins: Secondary | ICD-10-CM

## 2023-08-03 DIAGNOSIS — E785 Hyperlipidemia, unspecified: Secondary | ICD-10-CM

## 2023-08-03 DIAGNOSIS — R87618 Other abnormal cytological findings on specimens from cervix uteri: Secondary | ICD-10-CM

## 2023-08-03 DIAGNOSIS — R7303 Prediabetes: Secondary | ICD-10-CM | POA: Diagnosis not present

## 2023-08-03 DIAGNOSIS — I10 Essential (primary) hypertension: Secondary | ICD-10-CM

## 2023-08-03 DIAGNOSIS — I639 Cerebral infarction, unspecified: Secondary | ICD-10-CM

## 2023-08-03 DIAGNOSIS — Z111 Encounter for screening for respiratory tuberculosis: Secondary | ICD-10-CM | POA: Diagnosis not present

## 2023-08-03 NOTE — Assessment & Plan Note (Signed)
 Recheck levels

## 2023-08-03 NOTE — Assessment & Plan Note (Signed)
Due for repeat Pap, encouraged to schedule.

## 2023-08-03 NOTE — Patient Instructions (Addendum)
 It was great to see you!  Our plans for today:  - We are referring you to Nutrition. Let us  know if you don't hear about an appointment in the next few weeks.  - We are ordering a pelvic ultrasound. We will release these results to your MyChart. - Make an appointment to update your pap smear.   Take care and seek immediate care sooner if you develop any concerns.   Dr. Bobbiejo Ishikawa

## 2023-08-03 NOTE — Assessment & Plan Note (Signed)
 Not currently on statin. Will obtain lipid panel today and address at f/u.

## 2023-08-03 NOTE — Progress Notes (Signed)
   SUBJECTIVE:   CHIEF COMPLAINT / HPI:   Hypertension: - Medications: amlodipine  - Compliance: good - Checking BP at home: yes, 120-140s SBP. Mostly 135-140 SBP/60-80. - Denies any SOB, CP, vision changes, LE edema, medication SEs, or symptoms of hypotension - staying active  Breast complaint - states feels warm feeling coming across L breast - lasts 10-20 seconds at a time - prior normal mammogram 12/2022 - denies rashes, skin changes, nipple discharge, lumps/bumps   Pelvic pain - intermittent pelvic pain, thinks may be due to ovarian cysts as she has had h/o previously. - prior h/o postmenopausal bleeding, none currently - 2017 pelvic US  with fibroids, normal appearing ovaries.   B12 deficiency - last IM shot December. Continuing on oral supplementation.  OBJECTIVE:   BP 128/78   Pulse 68   Ht 5' 6 (1.676 m)   Wt 192 lb 3.2 oz (87.2 kg)   SpO2 99%   BMI 31.02 kg/m   Gen: well appearing, in NAD Breasts: breasts appear normal, no suspicious masses, no skin or nipple changes or axillary nodes. Card: RRR Lungs: CTAB Abd: soft, NTND Ext: WWP, no edema   ASSESSMENT/PLAN:   Essential hypertension At goal, no changes.  Cerebrovascular accident (CVA) [facility]) Not currently on statin. Will obtain lipid panel today and address at f/u.  Vitamin B 12 deficiency Recheck levels.  Abnormal Pap smear of cervix Due for repeat Pap, encouraged to schedule.   Breast complaint Normal exam. Suspect may be due to sensory nerve stimulation given brief nature, character of symptoms and otherwise normal exam. Continue to monitor. Will alert if any change in symptoms.  Pelvic pain Obtain TVUS. Encouraged pelvic exam with updated PAP.  Donald CHRISTELLA Lai, DO

## 2023-08-03 NOTE — Telephone Encounter (Signed)
 Called Centralized Scheduling while patient was in office and spoke with LaKeisha to schedule an US  appointment for patient.  Was able to schedule patient for 08/12/2023 at 10:30am, with arrival time being at 10:00am at [facility]. Patient must drink 32oz of water and come to appointment with a full bladder. It will be free valet parking for patient.  Provided patient with information and patient was also aware of appointment via MyChart.  Provided patient the centralized scheduling number just in case in need to reschedule.  Harlene Reiter, CMA

## 2023-08-03 NOTE — Assessment & Plan Note (Signed)
 At goal, no changes

## 2023-08-04 ENCOUNTER — Ambulatory Visit: Payer: Self-pay | Admitting: Family Medicine

## 2023-08-04 DIAGNOSIS — R102 Pelvic and perineal pain: Secondary | ICD-10-CM

## 2023-08-04 DIAGNOSIS — D219 Benign neoplasm of connective and other soft tissue, unspecified: Secondary | ICD-10-CM

## 2023-08-06 LAB — VITAMIN B12: Vitamin B-12: 260 pg/mL (ref 232–1245)

## 2023-08-06 LAB — QUANTIFERON-TB GOLD PLUS
QuantiFERON Nil Value: 0.02 [IU]/mL
QuantiFERON TB1 Ag Value: 0.03 [IU]/mL
QuantiFERON TB2 Ag Value: 0.03 [IU]/mL

## 2023-08-06 LAB — LIPID PANEL
Chol/HDL Ratio: 3.5 ratio (ref 0.0–4.4)
Cholesterol, Total: 137 mg/dL (ref 100–199)
HDL: 39 mg/dL — ABNORMAL LOW (ref >39)
LDL Chol Calc (NIH): 83 mg/dL (ref 0–99)
Triglycerides: 75 mg/dL (ref 0–149)
VLDL Cholesterol Cal: 15 mg/dL (ref 5–40)

## 2023-08-12 ENCOUNTER — Ambulatory Visit (HOSPITAL_COMMUNITY)
Admission: RE | Admit: 2023-08-12 | Discharge: 2023-08-12 | Disposition: A | Discharge status: Home / Self Care | Source: Ambulatory Visit | Attending: Family Medicine | Admitting: Family Medicine

## 2023-08-12 ENCOUNTER — Ambulatory Visit (HOSPITAL_COMMUNITY)

## 2023-08-12 ENCOUNTER — Encounter (HOSPITAL_COMMUNITY): Payer: Self-pay

## 2023-08-12 DIAGNOSIS — R102 Pelvic and perineal pain: Secondary | ICD-10-CM

## 2023-08-13 ENCOUNTER — Ambulatory Visit (HOSPITAL_COMMUNITY): Admission: RE | Admit: 2023-08-13 | Source: Ambulatory Visit

## 2023-08-17 ENCOUNTER — Ambulatory Visit (HOSPITAL_COMMUNITY)
Admission: RE | Admit: 2023-08-17 | Discharge: 2023-08-17 | Disposition: A | Discharge status: Home / Self Care | Source: Ambulatory Visit | Attending: Family Medicine | Admitting: Family Medicine

## 2023-08-17 DIAGNOSIS — R102 Pelvic and perineal pain: Secondary | ICD-10-CM | POA: Insufficient documentation

## 2023-08-20 MED ORDER — AMLODIPINE BESYLATE 5 MG PO TABS: 5.0000 mg | ORAL_TABLET | Freq: Every day | ORAL | 1 refills | Status: DC

## 2023-08-31 ENCOUNTER — Ambulatory Visit (HOSPITAL_COMMUNITY)

## 2023-09-11 ENCOUNTER — Encounter: Payer: Self-pay | Admitting: Family Medicine

## 2023-09-24 ENCOUNTER — Encounter: Payer: Self-pay | Admitting: Family Medicine

## 2023-09-25 ENCOUNTER — Ambulatory Visit
Admission: RE | Admit: 2023-09-25 | Discharge: 2023-09-25 | Disposition: A | Discharge status: Home / Self Care | Source: Ambulatory Visit | Attending: Family Medicine | Admitting: Family Medicine

## 2023-09-25 DIAGNOSIS — D219 Benign neoplasm of connective and other soft tissue, unspecified: Secondary | ICD-10-CM

## 2023-09-25 DIAGNOSIS — R102 Pelvic and perineal pain: Secondary | ICD-10-CM

## 2023-09-29 ENCOUNTER — Ambulatory Visit: Payer: Self-pay | Admitting: Family Medicine

## 2024-02-01 ENCOUNTER — Telehealth: Payer: Self-pay

## 2024-02-01 NOTE — Telephone Encounter (Signed)
 Patient calls nurse line reporting positive flu test.   She reports she tested positive on Saturday. She reports cough, congestion, headache and fevers. Tmax 102.  She does report nausea, however no vomiting. No diarrhea.   She is requesting Tamiflu if appropriate and if within widow.   Discussed conservative measures. Warm fluids with honey and Tylenol /Ibuprofen  for aches and fevers.   ED precautions discussed.   Will forward to PCP.

## 2024-02-02 NOTE — Telephone Encounter (Signed)
 I am covering for Dr. Madelon who is away from the office.  Called patient, reviewed chart. Symptoms began last Thursday (5 days ago) and are already beginning to improve Reviewed PMHx, only indication for tamiflu beyond treatment window is asthma but this has not been a major issue for her Overall feel risks of tamiflu at this point exceed benefits and I would recommend against Reviewed symptomatic treatment with patient, she was appreciative.  Donah Laymon PARAS, MD

## 2024-03-09 NOTE — Telephone Encounter (Signed)
 Patient returns call to nurse line regarding symptoms.   She reports that she continues to have a productive cough. Denies fever.   Advised that we would need to see her in office for further evaluation.   Scheduled in ATC tomorrow morning.   Chiquita JAYSON English, RN

## 2024-03-10 ENCOUNTER — Ambulatory Visit: Payer: Self-pay

## 2024-03-10 VITALS — BP 134/65 | HR 60 | Wt 191.4 lb

## 2024-03-10 DIAGNOSIS — R058 Other specified cough: Secondary | ICD-10-CM | POA: Diagnosis present

## 2024-03-10 DIAGNOSIS — Z87891 Personal history of nicotine dependence: Secondary | ICD-10-CM

## 2024-03-10 DIAGNOSIS — T7840XD Allergy, unspecified, subsequent encounter: Secondary | ICD-10-CM

## 2024-03-10 DIAGNOSIS — J4520 Mild intermittent asthma, uncomplicated: Secondary | ICD-10-CM

## 2024-03-10 NOTE — Progress Notes (Addendum)
" ° °  SUBJECTIVE:   CHIEF COMPLAINT / HPI:  Discussed the use of AI scribe software for clinical note transcription with the patient, who gave verbal consent to proceed.  History of Present Illness Meghan [name] is a 64 year old female with asthma and allergies who presents with a persistent cough following a flu infection.  Post-influenza respiratory symptoms - Tested positive for influenza on January 30, 2024 - Initial symptoms included cough, congestion, headache, fever to 102F, and nausea - Outside the treatment window for Tamiflu at time of diagnosis - Persistent productive cough for approximately four weeks, gradually improving but still present - Occasional phlegm production - No current fever, nausea, vomiting, or diarrhea - No current sinus drainage or significant discomfort - No close contacts currently ill  Asthma and allergic symptoms - History of asthma and allergies managed with inhaler and Zyrtec as needed - No increased wheezing or shortness of breath during current illness - No use of albuterol  during this illness - Zyrtec used only when symptoms are severe  PERTINENT  PMH / PSH: former tobacco use, quit in 1997 per chart review  OBJECTIVE:  BP 134/65   Pulse 60   Wt 191 lb 6.4 oz (86.8 kg)   SpO2 99%   BMI 30.89 kg/m   Physical Exam GENERAL: Alert, cooperative, well developed, no acute distress. CHEST: Clear to auscultation bilaterally, no wheezes, rhonchi, or crackles. CARDIOVASCULAR: Normal heart rate and rhythm, S1 and S2 normal without murmurs. ABDOMEN: Soft, non-distended, normal bowel sounds. NEUROLOGICAL: Cranial nerves grossly intact, moves all extremities without gross motor or sensory deficit.  ASSESSMENT/PLAN:   Assessment & Plan Post-viral cough syndrome Mild intermittent asthma without complication Allergy , subsequent encounter Persistent cough post-influenza likely due to residual lung inflammation. Possibly some allergic and asthma  component, though reassuringly without exacerbation today. No historical symptoms consistent with bacterial sinusitis. Also with GERD history, though less likely main driver given time course of symptoms. - Take Zyrtec daily for 2-4 weeks. - Use albuterol  inhaler as needed to assess impact on cough. - Continue honey and tea for symptomatic relief. - Consider chest x-ray if cough persists beyond 8 weeks, can also consider spirometry to assess asthma (and less likely nonasthmatic eosinophilic bronchitis) and trial of PPI/H2B for GERD Former tobacco use Quit in 1997 per chart review. Does not meet criteria for LDCT given quit for >15 years. No wheezes, increased WOB, or hypoxemia today on exam. - Recommend confirming quit date with patient at next visit with PCP  Stuart Redo, MD [facility]  "

## 2024-03-10 NOTE — Assessment & Plan Note (Addendum)
 Quit in 1997 per chart review. Does not meet criteria for LDCT given quit for >15 years. No wheezes, increased WOB, or hypoxemia today on exam. - Recommend confirming quit date with patient at next visit with PCP

## 2024-03-10 NOTE — Patient Instructions (Signed)
 VISIT SUMMARY: Today, we discussed your persistent cough following a flu infection and reviewed your asthma and allergy  management.  YOUR PLAN: POST-VIRAL COUGH SYNDROME WITH ASTHMA AND ALLERGIES: You have a persistent cough likely due to residual lung inflammation from your recent flu infection. -Take Zyrtec daily for 2-4 weeks trial. -Use your albuterol  inhaler as needed to see if it helps with the cough. -Continue using honey and tea for relief. -If the cough persists beyond 8 weeks, or if sinus pressure/congestion develop, let us  know. -Contact us  if your symptoms worsen otherwise.  Please let me know if you have any other questions.  Dr. Tharon

## 2024-03-10 NOTE — Assessment & Plan Note (Addendum)
 Persistent cough post-influenza likely due to residual lung inflammation. Possibly some allergic and asthma component, though reassuringly without exacerbation today. No historical symptoms consistent with bacterial sinusitis. Also with GERD history, though less likely main driver given time course of symptoms. - Take Zyrtec daily for 2-4 weeks. - Use albuterol  inhaler as needed to assess impact on cough. - Continue honey and tea for symptomatic relief. - Consider chest x-ray if cough persists beyond 8 weeks, can also consider spirometry to assess asthma (and less likely nonasthmatic eosinophilic bronchitis) and trial of PPI/H2B for GERD

## 2024-03-17 ENCOUNTER — Other Ambulatory Visit: Payer: Self-pay | Admitting: Family Medicine
